# Patient Record
Sex: Male | Born: 1935 | Race: White | Hispanic: No | Smoking: Former smoker
Health system: Southern US, Community
[De-identification: ages and names within clinical notes are randomized; demographics above are authoritative.]

## PROBLEM LIST (undated history)

## (undated) DIAGNOSIS — I10 Essential (primary) hypertension: Secondary | ICD-10-CM

## (undated) DIAGNOSIS — H9190 Unspecified hearing loss, unspecified ear: Secondary | ICD-10-CM

## (undated) DIAGNOSIS — C801 Malignant (primary) neoplasm, unspecified: Secondary | ICD-10-CM

## (undated) DIAGNOSIS — M51369 Other intervertebral disc degeneration, lumbar region without mention of lumbar back pain or lower extremity pain: Secondary | ICD-10-CM

## (undated) DIAGNOSIS — M5136 Other intervertebral disc degeneration, lumbar region: Secondary | ICD-10-CM

## (undated) DIAGNOSIS — N529 Male erectile dysfunction, unspecified: Secondary | ICD-10-CM

## (undated) DIAGNOSIS — I219 Acute myocardial infarction, unspecified: Secondary | ICD-10-CM

## (undated) DIAGNOSIS — I255 Ischemic cardiomyopathy: Secondary | ICD-10-CM

## (undated) DIAGNOSIS — I4891 Unspecified atrial fibrillation: Secondary | ICD-10-CM

## (undated) DIAGNOSIS — R7301 Impaired fasting glucose: Secondary | ICD-10-CM

## (undated) DIAGNOSIS — K649 Unspecified hemorrhoids: Secondary | ICD-10-CM

## (undated) DIAGNOSIS — I2510 Atherosclerotic heart disease of native coronary artery without angina pectoris: Secondary | ICD-10-CM

## (undated) DIAGNOSIS — I5022 Chronic systolic (congestive) heart failure: Secondary | ICD-10-CM

## (undated) DIAGNOSIS — M109 Gout, unspecified: Secondary | ICD-10-CM

## (undated) DIAGNOSIS — D649 Anemia, unspecified: Secondary | ICD-10-CM

## (undated) DIAGNOSIS — E782 Mixed hyperlipidemia: Secondary | ICD-10-CM

## (undated) DIAGNOSIS — G4700 Insomnia, unspecified: Secondary | ICD-10-CM

## (undated) DIAGNOSIS — F0390 Unspecified dementia without behavioral disturbance: Secondary | ICD-10-CM

## (undated) DIAGNOSIS — G5600 Carpal tunnel syndrome, unspecified upper limb: Secondary | ICD-10-CM

## (undated) HISTORY — PX: APPENDECTOMY: SHX54

## (undated) HISTORY — PX: NASAL SINUS SURGERY: SHX719

## (undated) HISTORY — DX: Unspecified hemorrhoids: K64.9

## (undated) HISTORY — DX: Atherosclerotic heart disease of native coronary artery without angina pectoris: I25.10

## (undated) HISTORY — PX: COLONOSCOPY: SHX174

## (undated) HISTORY — PX: PROSTATECTOMY: SHX69

## (undated) HISTORY — PX: CHOLECYSTECTOMY: SHX55

## (undated) HISTORY — DX: Gout, unspecified: M10.9

## (undated) HISTORY — DX: Anemia, unspecified: D64.9

## (undated) HISTORY — DX: Essential (primary) hypertension: I10

## (undated) HISTORY — PX: HEMORRHOID BANDING: SHX5850

## (undated) HISTORY — PX: TONSILLECTOMY: SUR1361

## (undated) HISTORY — PX: HAND SURGERY: SHX662

## (undated) HISTORY — DX: Male erectile dysfunction, unspecified: N52.9

## (undated) HISTORY — DX: Chronic systolic (congestive) heart failure: I50.22

## (undated) HISTORY — DX: Insomnia, unspecified: G47.00

## (undated) HISTORY — DX: Carpal tunnel syndrome, unspecified upper limb: G56.00

## (undated) HISTORY — PX: INGUINAL HERNIA REPAIR: SUR1180

## (undated) HISTORY — DX: Impaired fasting glucose: R73.01

## (undated) HISTORY — DX: Mixed hyperlipidemia: E78.2

## (undated) HISTORY — DX: Unspecified hearing loss, unspecified ear: H91.90

---

## 1983-07-26 DIAGNOSIS — I219 Acute myocardial infarction, unspecified: Secondary | ICD-10-CM

## 1983-07-26 HISTORY — DX: Acute myocardial infarction, unspecified: I21.9

## 2001-07-25 HISTORY — PX: COLONOSCOPY: SHX174

## 2002-07-25 HISTORY — PX: CORONARY ARTERY BYPASS GRAFT: SHX141

## 2002-08-07 ENCOUNTER — Encounter: Payer: Self-pay | Admitting: *Deleted

## 2002-08-07 ENCOUNTER — Encounter (HOSPITAL_COMMUNITY): Admission: RE | Admit: 2002-08-07 | Discharge: 2002-09-06 | Payer: Self-pay | Admitting: *Deleted

## 2002-08-08 ENCOUNTER — Encounter: Payer: Self-pay | Admitting: *Deleted

## 2002-08-08 ENCOUNTER — Inpatient Hospital Stay (HOSPITAL_COMMUNITY): Admission: RE | Admit: 2002-08-08 | Discharge: 2002-08-21 | Payer: Self-pay | Admitting: *Deleted

## 2002-08-11 ENCOUNTER — Encounter: Payer: Self-pay | Admitting: Thoracic Surgery (Cardiothoracic Vascular Surgery)

## 2002-08-13 ENCOUNTER — Encounter: Payer: Self-pay | Admitting: Thoracic Surgery (Cardiothoracic Vascular Surgery)

## 2002-08-14 ENCOUNTER — Encounter: Payer: Self-pay | Admitting: Thoracic Surgery (Cardiothoracic Vascular Surgery)

## 2002-08-15 ENCOUNTER — Encounter: Payer: Self-pay | Admitting: Thoracic Surgery (Cardiothoracic Vascular Surgery)

## 2002-09-12 ENCOUNTER — Ambulatory Visit (HOSPITAL_COMMUNITY): Admission: RE | Admit: 2002-09-12 | Discharge: 2002-09-12 | Payer: Self-pay | Admitting: *Deleted

## 2004-08-17 ENCOUNTER — Ambulatory Visit (HOSPITAL_COMMUNITY): Admission: RE | Admit: 2004-08-17 | Discharge: 2004-08-17 | Payer: Self-pay | Admitting: Ophthalmology

## 2007-11-06 ENCOUNTER — Ambulatory Visit (HOSPITAL_COMMUNITY): Admission: RE | Admit: 2007-11-06 | Discharge: 2007-11-06 | Payer: Self-pay | Admitting: Family Medicine

## 2008-07-25 HISTORY — PX: COLONOSCOPY: SHX174

## 2010-12-10 NOTE — Op Note (Signed)
NAME:  Evan Melendez, Evan Melendez                       ACCOUNT NO.:  [id number]   MEDICAL RECORD NO.:  [id number]                   PATIENT TYPE:  INP   LOCATION:  2303                                 FACILITY:  [facility]   PHYSICIAN:  Salvatore Decent. Cornelius Moras, M.D.              DATE OF BIRTH:  11-04-1925   DATE OF PROCEDURE:  08/13/2002  DATE OF DISCHARGE:                                 OPERATIVE REPORT   PREOPERATIVE DIAGNOSIS:  Severe three-vessel coronary artery disease with  atypical chest pain and abnormal  stress Cardiolite examination.   POSTOPERATIVE DIAGNOSIS:  Severe three-vessel coronary artery disease with  atypical chest pain and abnormal stress Cardiolite examination.   PROCEDURE:  Median sternotomy for coronary artery bypass grafting x3 (left  internal mammary artery to the distal left anterior descending coronary  artery, saphenous vein graft to the circumflex marginal branch, saphenous  vein graft to the distal right coronary artery).   SURGEON:  Salvatore Decent. Cornelius Moras, M.D.   FIRST ASSISTANT:  Kerin Perna, M.D.   SECOND ASSISTANT:  Lissa Merlin, P.A.   ANESTHESIA:  General.   INDICATIONS FOR PROCEDURE:  The patient is a 77 year old gentleman followed  by Dr. Lubertha South and Dr. Cecil Cranker, who has a history of coronary  artery disease, hypertension, and a remote history of tobacco abuse.  The  patient presents with progressive weakness and atypical chest pain.  He had  a stress Cardiolite exam which was abnormal, prompting a cardiac  catheterization.  This was performed by Dr. Jonelle Sidle on August 08, 2002, and notable for the findings of severe three-vessel coronary  artery disease with long segment high-grade calcified proximal stenosis of  the left anterior descending coronary artery.  This not felt amenable to  percutaneous coronary intervention.  A full consultation note has been  dictated previously.  The patient and his wife have been counseled at  length  regarding the indications and potential benefits of coronary artery bypass  grafting.  Alternative treatment strategies have been discussed.  They  understand and accept all associated risks of surgery, including but not  limited to, risks of death, stroke, myocardial infarction, bleeding  requiring blood transfusion, respiratory failure, arrhythmia, infection, and  recurrent coronary artery disease.  They desire to proceed with surgery as  described.   DESCRIPTION OF PROCEDURE:  The patient is brought to the operating room on  the above-mentioned date and central monitoring is established by the  anesthesia service under the care and direction of Dr. Sheldon Silvan.  Specifically, a Swan-Ganz catheter is placed through the right internal  jugular approach.  A radial arterial line is placed.  Intravenous  antibiotics are administered.  Following the induction with general  endotracheal anesthesia, a Foley catheter is placed.  A transesophageal  echocardiogram is performed by Dr. Ivin Booty.  This demonstrates the presence of  mild to moderate left ventricular dysfunction with inferior  wall akinesis  and normal function of the remainder of the left ventricle.  There is mild  aortic insufficiency.  The patient's chest, abdomen, both groins and both lower extremities are  prepared and draped in a sterile manner.  A median sternotomy incision is  performed in the left internal mammary artery.  It was dissected from the  chest wall and prepared for bypass grafting.  The left internal mammary  artery is a good quality conduit.  Simultaneously the saphenous vein is  obtained from the patient's right thigh using endoscopic vein harvest  technique.  The saphenous vein here is somewhat small, sclerotic, and only a  portion of it is suitable for grafting.  Subsequently an additional segment  of saphenous vein is obtained from the patient's left lower leg through a  longitudinal incision.  This  saphenous vein is also somewhat small, but  suitable as conduit for grafting.  The patient is heparinized systemically.  The pericardium is opened.  There are diffuse adhesions throughout the  pericardial space consistent with old pericarditis.  These are divided using  sharp dissection.  The ascending aorta is inspected.  The ascending aorta is  severely diseased with atherosclerotic plaque.  There are diffuse areas of  plaque, particularly located proximally.  The arch feels soft and more  normal in appearance in the vicinity of the takeoff of the innominate  artery.  The aorta is cannulated for cardiopulmonary bypass in this location  without difficulty.  A two-stage venous cannula is placed through the right  atrium.  Adequate heparinization is verified.  Cardiopulmonary bypass is begun, and the surface of the heart is inspected.  There is diffuse coronary artery disease with hard calcified atherosclerotic  plaque located throughout all of the epicardial vessels.  This seems to be  somewhat out of proportion to the patient's radiographic appearance at  cardiac catheterization, including the large circumflex marginal branch,  which is diffusely diseased.  There is scar in the inferior wall.  Portions  of saphenous vein and in the left internal mammary artery are trimmed to  appropriate lengths.  A temperature probe is placed in the left ventricular  septum.  A cardioplegic catheter is placed in the ascending aorta.  The patient is cooled to 30 degrees systemic temperature.  The aortic  crossclamp is applied, and cardioplegia is delivered initially in an  antegrade fashion through the aortic root.  Iced saline slush is applied for  topical hypothermia.  The initial cardioplegic arrest and myocardial cooling  are notable sluggish, and felt to be somewhat unsatisfactory.  Despite an  initial dose of 1500 cubic centimeters of cardioplegia, satisfactory septal cooling is not achieved.   Subsequently, a retrograde cardioplegic catheter  is placed through the right atrium into the coronary sinus.  Additional  cardioplegia is administered and excellent myocardial cooling is achieved.  Repeat doses of cardioplegia are administered intermittently throughout the  crossclamp portion of the operation, both through the retrograde  cardioplegia catheter, as well as antegrade through subsequently-placed vein  grafts, to maintain septal temperature below 15 degrees Centigrade.  The  following distal coronary anastomoses are performed:  The distal right  coronary artery is grafted with a saphenous vein graft in an end-to-side  fashion.  This coronary measured 1.5 mm in diameter and is of fair to poor  quality.  The circumflex marginal branch is grafted with a saphenous vein  graft in an end-to-side fashion.  This coronary measures 1.8 mm in diameter  and is of fair quality.  The distal left anterior descending coronary artery  is grafted with the left internal mammary artery in an end-to-side fashion.  This coronary measured 1.8 mm in diameter and is of good quality.  Both  proximal saphenous vein anastomoses are performed directly to the ascending  aorta prior to release of the aortic crossclamp.  Of note, the aorta is  diffusely diseased and there is visible and friable plaque within the aorta  at the site of aortotomy punches.  This is debrided as much as possible and  irrigated with copious iced saline solution.  The proximal anastomoses are  then constructed.  The patient is placed in a steep Trendelenburg position.  The septal temperature rises rapidly and dramatically upon reperfusion of  the left internal mammary artery.  One final dose of warm retrograde hot  shot cardioplegia is administered.  The aortic crossclamp is removed, after  a total crossclamp time of 81 minutes.  The heart is defibrillated into sinus rhythm and subsequently resumes  spontaneously.  Of note, the  patient did develop atrial fibrillation at the  beginning of the procedure prior to the initiation of cardiopulmonary  bypass.  The patient is rewarmed to greater than 37 degrees Centigrade  temperature.  The epicardial pacing wires are affixed to the right  ventricular outflow track and to the right atrial appendage.  All proximal  and distal anastomoses are inspected for hemostasis, and appropriate graft  orientation.  The patient is weaned from cardiopulmonary bypass without  difficulty.  The patient's rhythm on separation from bypass is normal sinus  rhythm.  No inotropic support is required.  The total cardiopulmonary bypass  time for the operation is 112 minutes.  A follow-up transesophageal  echocardiogram performed by Dr. Ivin Booty after the separation from bypass  demonstrates no significant changes in the left ventricular performance.  No  other abnormalities are specifically identified.  The venous and arterial cannulae are removed uneventfully.  Protamine is administered to reverse the anticoagulation.  The mediastinum and the left  chest are irrigated with saline solution containing vancomycin.  Meticulous  surgical hemostasis is ascertained.  The mediastinum and the left chest are  drained with three chest tubes placed through separate stab wound incisions  inferiorly.  The median sternotomy is closed in a routine fashion.  The  lower extremity incisions are closed in multiple layers in a routine  fashion.  All skin incisions are closed with subcuticular skin closures.  The patient tolerated the procedure well and was transported to the surgical  intensive care unit in stable condition.  There were no intraoperative  complications.  All sponge, instrument and needle counts are verified as  correct at the completion of the operation.  The patient was transfused one  10-pack of adult platelets due to thrombocytopenia after the reversal of  heparin with Protamine.  The particular  finding of this case with comment,  is the finding of the diffusely diseased ascending aorta, which would put  the patient at risk for atheroembolic complications.  The patient would not  be felt to be a candidate for a redo coronary artery bypass grafting because  of the severity of disease of his ascending aorta.  Salvatore Decent. Cornelius Moras, M.D.    CHO/MEDQ  D:  08/13/2002  T:  08/13/2002  Job:  [id number]   cc:   Cecil Cranker, M.D. [facility]   Dionicio Stall, M.D.   Lubertha South, M.D.   [facility]  [employer], [place]

## 2010-12-10 NOTE — Group Therapy Note (Signed)
   NAME:  Evan Melendez, Evan Melendez                         ACCOUNT NO.:  [id number]   MEDICAL RECORD NO.:  [id number]                  PATIENT TYPE:   LOCATION:                                       FACILITY:   PHYSICIAN:  Cecil Cranker, M.D. [facility]         DATE OF BIRTH:  12-31-1925   DATE OF PROCEDURE:  08/07/2002  DATE OF DISCHARGE:                                   PROGRESS NOTE   INDICATION:  The patient has history of coronary artery disease, status post  myocardial infarction 18 years ago complicated by congestive heart failure  and ventricular fibrillation.  His most recent stress test was approximately  11 years ago.  He is now presenting with atypical chest pain.   BASELINE DATA:  EKG:  Sinus rhythm with right bundle branch block at 40  beats per minute with left axis deviation, inverted T waves inferiorly.  Resting blood pressure 144/84.   The patient exercised for approximately 8 minutes and 5 seconds to 10.1 METS  to Bruce protocol stage III.  His maximum heart rate was 129 and maximum  blood pressure was 210/88.  His EKG showed ST depression and T-wave  inversion in V4-V6 with ST depression in the inferior leads as well.  The  patient remained asymptomatic throughout the stress test.  Exercise was  stopped secondary to significant ST depression.  The patient was given one  sublingual nitroglycerin in the recovery period for the ischemic changes on  EKG.  His ST depression and T-wave inversion resolved in the recovery period  after approximately 17 minutes.   Final images and results are pending M.D. review.  The patient was given a  prescription for sublingual nitroglycerin p.r.n. chest pain.     Amy Mercy Riding, P.A. [facility]                     E. Graceann Congress, M.D. [facility]    AB/MEDQ  D:  08/07/2002  T:  08/07/2002  Job:  [id number]

## 2010-12-10 NOTE — Consult Note (Signed)
NAME:  Evan Melendez, Evan Melendez                       ACCOUNT NO.:  [id number]   MEDICAL RECORD NO.:  [id number]                   PATIENT TYPE:  OIB   LOCATION:  2016                                 FACILITY:  [facility]   PHYSICIAN:  Salvatore Decent. Cornelius Moras, M.D.              DATE OF BIRTH:  16-Jan-1926   DATE OF CONSULTATION:  08/09/2002  DATE OF DISCHARGE:                                   CONSULTATION   PRIMARY CARE PHYSICIAN:  Donna Bernard, M.D.   REASON FOR CONSULTATION:  Severe two vessel coronary artery disease with  atypical chest pain and abnormal stress Cardiolite examination.   HISTORY OF PRESENT ILLNESS:  The patient is a 77 year old gentleman from  [place], [place], with history of coronary artery disease,  hypertension, and a remote history of tobacco use.  He apparently suffered a  large inferior wall myocardial infarction in 1985 which was complicated by  congestive heart failure and ventricular fibrillation.  Since then he has  been treated medically for coronary artery disease and hypertension.  He  quit smoking.  He has done quite well until recently.  Over the last several  months, he apparently developed increasing generalized weakness as well as  difficulty controlling his blood pressure.  He has had a few fleeting  episodes of substernal chest pain, although these have been quite atypical  in nature, very transient and not associated with physical activity,  shortness of breath, nausea, or diaphoresis.  He returned to see Dr. Corinda Gubler  last month and was subsequently scheduled for an elective stress Cardiolite  examination and 2-D echocardiogram.  The Cardiolite examination demonstrated  decreased LV function with ejection fraction estimated 44% in what appeared  to be a largely nonreversible profusion defect in the inferior septal and  posteroseptal walls with hypokinesia of the posterior and inferior septal  walls.  He was subsequently brought in for elective  cardiac catheterization  yesterday.  This was performed by Dr. Diona Browner and notable for the findings  of severe two vessel coronary artery disease with moderate left ventricular  dysfunction.  He has chronic occlusion of the right coronary artery with a  long segment, highly calcified  75% stenosis  of the proximal left anterior  descending coronary artery.  This lesion is felt not amenable to  percutaneous coronary intervention and cardiac surgical consultation has  been requested.   REVIEW OF SYSTEMS:  GENERAL:  The patient reports overall feeling relatively  well with exception of his slight progression of exertional fatigue.  He has  good appetite.  He has lost weight over the last year or so, although his  wife states that this has been related to dietary changes they have made and  increased physical activity.  CARDIAC:  The patient denies any symptoms of  typical angina.  He has had fleeting episodes of atypical chest pain which  he describes as a pressing pain located to  the left of midline which comes  and goes sporadically at any time and usually only lasts a few seconds at  most.  They are not associated with physical activity.  He denies any  exertional shortness of breath, PND, orthopnea, or lower extremity edema.  He has had occasional palpitations but he denies any syncopal episodes or  near syncopal spells.  RESPIRATORY:  Essentially negative.  The patient does  note that he has a dry hacking, nonproductive cough which started  approximately one month ago.  He denies any productive cough, hemoptysis, or  wheezing.  GASTROINTESTINAL:  Negative.  The patient reports occasional  difficulty with constipation but this has been managed with dietary changes.  He denies any recent history of hematochezia, hematemesis, or melena.  He  did undergo colonoscopy and polypectomy last August with findings of a  benign colonic polyp.  He reports no difficulty swallowing.  ENDOCRINE:   Negative and notable for the absence of history of diabetes.  MUSCULOSKELETAL:  Notable for some mild arthralgias in his back, hip, and in  particular his left knee.  He has history of Dupuytren's contractures in  both hands.  NEUROLOGIC:  Negative and notable for the absence of any  history of transient monocular blindness or transient numbness or weakness  involving either upper or lower extremity.  HEMATOLOGIC:  Notable only for  history of iron-deficiency anemia in the past.  The patient denies any  bleeding diaphysis, easy bruising, or frequent episodes of epistaxis.  HEENT:  Notable for cataract in the left eye.  The patient states his eye  sight is stable.  He has full set upper and partial lower dentures.  He  denies any loose teeth or painful teeth.  He is moderately hard of hearing  in both ears.   PAST MEDICAL HISTORY:  This is notable for history of coronary artery  disease, status post acute inferior myocardial infarction in 1985.  The  patient has history of hypertension.  The patient denies any known history  of diabetes, stroke, peripheral vascular disease.  The patient's lipid  status has remained under reasonably good control.   PAST SURGICAL HISTORY:  This is notable for previous cholecystectomy and  inguinal hernia repair.   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  They have two grown children and several grandchildren.  He remains quite  active physically and also plays a lot of bluegrass music.  He is currently  a nonsmoker, although he previously smoked and he quit at the time of his  heart attack in 1985.  He denies excessive alcohol consumption.   MEDICATIONS PRIOR TO ADMISSION:  1. Atenolol 100 mg p.o. once daily.  2. Lasix 40 mg p.o. once daily.  3. Zantac 150 mg p.o. once daily.  4. Aspirin 81 mg p.o. once daily.  5. Lipitor 20 mg p.o. once daily.  6. Altace 5 mg p.o. once daily. *The Lipitor and Altace were recently  added by Dr. Corinda Gubler.  1. The patient takes an iron supplement.   ALLERGIES:  The patient reports a drug sensitivity to QUINIDINE.   PHYSICAL EXAMINATION:  GENERAL APPEARANCE:  A well-appearing white male who  appears his stated age in no acute distress.  VITAL SIGNS:  He is currently in sinus rhythm with heart rate in the 40s or  50s.  Blood pressure has remained reasonably well controlled this  hospitalization, measuring 130/55 this morning.  He has been afebrile.  HEENT:  Essentially within normal limits.  NECK:  Supple.  There is no cervical or supraclavicular lymphadenopathy.  There is no jugular venous distension.  No carotid bruits are noted.  CHEST:  Auscultation of the chest reveals clear and symmetrical breath  sounds bilaterally.  No wheezes or rhonchi are noted.  CARDIOVASCULAR:  There is regular rate and rhythm.  No murmurs, rubs, or  gallops are noted.  ABDOMEN:  Soft and nontender.  There are no palpable masses.  The liver edge  is not enlarged.  Bowel sounds are present.  EXTREMITIES:  The extremities are warm and well perfused.  There is no lower  extremity edema.  Distal pulses are palpable in both lower legs at the  ankle.  There is no sign of significant veinous insufficiency.  RECTAL AND GU:  These examinations are both deferred.  NEUROLOGIC:  Grossly nonfocal examination.   DIAGNOSTIC TESTS:  Cardiac catheterization performed yesterday by Dr.  Diona Browner has been referred.  This demonstrated long segment, heavily  calcified 75% proximal stenosis of the left anterior descending coronary  artery.  I agree that this likely would not be very amenable to percutaneous  coronary intervention.  There is a 30 to 50% ostial stenosis of the left  circumflex coronary artery and 30% stenosis at the mid portion of this  vessel.  There is 100% chronic occlusion of the right coronary artery with  left-to-right collateral filling of the distal right circulation.  Left  ventricular  function is moderately reduced with akinesis of the inferior  wall.  There is trace to mild mitral regurgitation.  The remainder of the  left ventricle contracts vigorously and overall ejection fraction is  estimated at 42%.   IMPRESSION:  Severe two vessel coronary artery disease with atypical chest  pain and abnormal stress Cardiolite examination.  I believe that the patient  would probably best be treated with elective coronary artery bypass  grafting.   PLAN:  I have outlined issues at length with the patient and his wife.  Alternative treatment strategies have been discussed in detail. They  understand and accept all associated risks of surgery and desire to proceed  with bypass surgery as planned.  The patient specifically understands and  accepts all associated risks including, but not limited to, risks of death,  stroke, myocardial infarction, respiratory failure, pneumonia, bleeding requiring blood transfusion, arrhythmia, infection, recurrent coronary  artery disease.  We plan to proceed with surgery on Tuesday,  August 11, 2002, first case.                                                Salvatore Decent. Cornelius Moras, M.D.    CHO/MEDQ  D:  08/09/2002  T:  08/09/2002  Job:  [id number]   cc:   Cecil Cranker, M.D. [facility]   Donna Bernard, M.D.  [address]  [place]  [place] [address]  Fax: [phone]   Vida Roller, M.D.  Fax: [phone]   [facility]  [place], [place]

## 2010-12-10 NOTE — Discharge Summary (Signed)
NAME:  Evan Melendez, Evan Melendez                       ACCOUNT NO.:  [id number]   MEDICAL RECORD NO.:  [id number]                   PATIENT TYPE:  INP   LOCATION:  2033                                 FACILITY:  [facility]   PHYSICIAN:  Salvatore Decent. Cornelius Moras, M.D.              DATE OF BIRTH:  [birth year]   DATE OF ADMISSION:  08/08/2002  DATE OF DISCHARGE:  08/18/2002                                 DISCHARGE SUMMARY   ADMITTING DIAGNOSIS:  1. Coronary artery disease.  2. Normal Cardiolite stress test.   PAST MEDICAL HISTORY:  1. Coronary artery disease status post MI 1985, treated medically.  2. Hypertension.  3. Tobacco use.  He quit in 1985.   SURGICAL HISTORY:  1. Cholecystectomy.  2. Inguinal hernia repair.   DRUG SENSITIVITY:  THIS PATIENT REPORTS A DRUG SENSITIVITY TO QUINIDINE.   BRIEF HISTORY:  The patient is a 77 year old Caucasian gentleman who is  followed by the [facility] for his cardiac disease.  Several  months prior to admission, he developed increasing generalized weakness,  difficulty controlling his blood pressure, and fleeting chest pain.  He was  evaluated by Dr. Corinda Gubler in the office, who recommended elective stress  Cardiolite exam and 2D echocardiogram.  Cardiolite revealed decreased LV  function with ejection fraction estimated to be 44%.  With this result, Dr.  Corinda Gubler recommended proceeding with a cardiac catheterization.  This was  performed on January 15 by Dr. Diona Browner and was notable for severe 3-vessel  coronary artery disease with moderate left ventricular dysfunction.  His  lesions were not amenable to PCI, and therefore cardiac surgery consultation  was requested.  He was evaluated on January 75 by Dr. Tressie Stalker.  After  examination of the patient and review of available records, Dr. Cornelius Moras  recommended proceeding with coronary artery bypass grafting as the preferred  treatment route for this gentleman.  The procedure risks and benefits  were  discussed with the patient and his wife, and they agreed  to proceed.   Carotid Doppler studies revealed no significant coronary artery disease.  He  was noted to have palpable pedal pulses bilaterally.   The patient remained stable in the hospital over the next several days, and  on August 13, 2002, he underwent the following surgical procedure:  Coronary artery bypass grafting x3 with Dr. Tressie Stalker.  Grafts placed at  the time of procedure:  Left internal mammary artery grafted to the left  anterior descending artery, saphenous venous grafting to the right coronary  artery, saphenous vein grafting to the marginal artery.  Veins were  harvested from the right side via Endovein harvesting technique for the vein  bypass grafts.  The patient tolerated the procedure reasonably well and  transferred in stable condition to the SICU.  He remained hemodynamically  stable during the postoperative period.  He was extubated later that day.   The patient's postoperative course has  been uneventful.  His beta blockers  and ACE inhibitors have both restarted at low doses.  He does have some mild  volume overload, and he is being treated with diuretic.   The morning of August 17, 2002, the patient reports feeling well.  He is  awake, alert, and answering questions appropriately.  His vital signs are  stable.  He is oxygenating well with room air saturation at 95.7.  Blood  pressure is 101/44.  The patient is making good progress in recovering from  his surgery.  It is anticipated he will be discharged home in the next 24 to  48 hours.   LABORATORY STUDIES:  January 24:  CBC reveals white blood cells 4.9,  hemoglobin 8.1, hematocrit 23.3, platelets 101.  Chemistries include sodium  139, potassium 4.2, BUN 31, creatinine 1.5, glucose 119.   CONDITION ON DISCHARGE:  Improved.   INSTRUCTIONS ON DISCHARGE:  MEDICATIONS:  1. Toprol XL 25 mg p.o. q.d.  2. Altace 2.5 mg p.o. q.d.  This is a  new dose for him.  3. Lasix 40 mg p.o. q.d.  This is also a new dose for him.  4. Potassium chloride 20 mEq p.o. q.d.  5. Enteric-coated aspirin 325 mg p.o. q.d.  6. Folic acid 1 mg p.o. q.d.   He is also instructed to resume the following home medications:  1. Lipitor 20 mg p.o. q.h.s.  2. Ranitidine 150 mg p.o. q.d.   PAIN MANAGEMENT:  He is instructed to take Ultram 50 mg 1-2 p.o. q. 4-6  hours p.r.n. for moderate to severe pain, or Tylenol 325 mg 1-2 p.o. q. 4-6  hours p.r.n.Marland Kitchen   ACTIVITY:  No driving, no heavy lifting, pushing, or pulling.  He is  instructed to continue his previous exercise and daily walking.   DIET:  His diet should be a low-fat, low-salt diet.   WOUND CARE:  He may shower with mild soap and water.  If his incisions are  red, hot, swollen, draining, or if he has a fever greater than 100 degrees  Fahrenheit, he should call Dr. Orvan July office.   FOLLOW UP:  1. He will have an appointment to see a physician at [facility] in     [place] in approximately 2     weeks.  They will be in contact with him for an appointment.  2. He will have an appointment to see Dr. Cornelius Moras at the [facility] office in 3     weeks.  The office will call him with the date and time.     Toribio Harbour, R.N.                  Salvatore Decent. Cornelius Moras, M.D.    CTK/MEDQ  D:  08/17/2002  T:  08/18/2002  Job:  [id number]   cc:   Salvatore Decent. Cornelius Moras, M.D.  [address]  [place]  [place] [address]  Fax: [phone]   E. Graceann Congress, M.D. [facility]   Lacretia Nicks Simone Curia, M.D.  [address]  [place]  [place] [address]  Fax: [phone]

## 2010-12-10 NOTE — Cardiovascular Report (Signed)
NAME:  Evan Melendez, Evan Melendez NO.:  [id number]   MEDICAL RECORD NO.:  [id number]                   PATIENT TYPE:  OIB   LOCATION:  2899                                 FACILITY:  [facility]   PHYSICIAN:  Jonelle Sidle, M.D. [facility]        DATE OF BIRTH:  [birth year]   DATE OF PROCEDURE:  08/08/2002  DATE OF DISCHARGE:                              CARDIAC CATHETERIZATION   PRIMARY CARE PHYSICIAN:  Wende Crease, M.D.   Corinda Gubler CARDIOLOGIST:  Cecil Cranker, M.D.   INDICATIONS:  The patient is a 77 year old male with a history of  hypertension and remote inferior myocardial wall infarction in 1985.  Cardiac catheterization at [facility] at that time  apparently revealed an occluded right coronary artery and the patient has  been managed medically since then.  He has had recent episodes of  significant hypertension as well as intermittent chest discomfort and  apparent drops in blood pressure during exercise at cardiac rehabilitation.  He was referred for a Cardiolite which was reported as showing a  nonreversible defect in the inferior septal and posterior septal wall with  an ejection fraction of 44% and hypokinesis of the posterior and inferior  septal walls.  He is now referred for coronary angiography to assess his  coronary anatomy.   PROCEDURES PERFORMED:  1. Left heart catheterization.  2. Selective coronary angiography.  3. Left ventriculography.  4. Distal aortography.   ACCESS AND EQUIPMENT:  The area about the right femoral artery was  anesthetized with 1% lidocaine and a 6 French sheath was placed in the right  femoral artery via the modified Seldinger technique. Standard preformed 6  [place] and JR4 catheters were used for selective coronary angiography,  and an angled pigtail catheter was used for left heart catheterization and  left ventriculography.  All exchanges were made over a wire and the patient  tolerated  the procedure well without obvious complications.  The angled  pigtail catheter was also used for distal aortography.   HEMODYNAMICS:  1. Left ventricle 166/12 mmHg.  2. Aorta 159/57 mmHg.   ANGIOGRAPHIC FINDINGS:  1. Left main coronary artery has a 30% ostial stenosis.  2. Left anterior descending is calcified proximally with a 50% ostial     stenosis and an 75% diffuse proximal stenosis.  3. The circumflex coronary artery has a 50% ostial stenosis essentially     involving the bifurcation of the left anterior descending and circumflex     from the left main. There is a 30% proximal stenosis noted as well,     otherwise minor luminal irregularities are observed.  4. The right coronary artery is occluded proximally and has well-developed     collaterals predominately from the circumflex, but also a left anterior     descending.   LEFT VENTRICULOGRAPHY:  Left ventriculography was performed in the RAO  projection revealed an ejection fraction of 42% with inferior basal akinesis  and 1+ mitral regurgitation.   DISTAL AORTOGRAM:  Distal aortography revealed a 50% left renal artery  stenosis, 40% distal aortic stenosis with other atherosclerotic changes and  a 50% left iliac stenosis.   DIAGNOSES:  1. Two-vessel obstructive coronary artery disease with an occluded right     coronary artery and a proximally calcified left anterior descending with     50 and 75% stenoses.  The right coronary artery fills by well-developed     collaterals from the circumflex and left anterior descending.  2. Decreased ejection fraction of 42% with inferior basal akinesis and 1+     mitral regurgitation.  3. Peripheral vascular disease as outlined with a 50% left renal artery     stenosis and a 50% left iliac stenosis.   RECOMMENDATIONS:  I reviewed the films with Dr. Chales Abrahams.  It is interesting  that the patient's recent Cardiolite revealed no anterior wall ischemia and  essentially inferior scar.   However, the patient has had some fluctuations  in blood pressure and intermittent chest discomfort.  Given the complexity  of the left anterior descending stenosis, it is not a good percutaneous  intervention candidate.  The left system is particularly important given the  fact that it completely collateralizes the right.  Options would include  continued medical therapy versus complete revascularization with bypass  grafting. We will ask [facility] to review the films and at least discuss his  option with the patient and his family. We will otherwise try to maximize  medications.                                                  Jonelle Sidle, M.D. [facility]    SGM/MEDQ  D:  08/08/2002  T:  08/09/2002  Job:  [id number]   cc:   Wende Crease, M.D.   Cecil Cranker, M.D. [facility]

## 2010-12-10 NOTE — Consult Note (Signed)
   NAME:  Evan Melendez, Evan Melendez                         ACCOUNT NO.:  [id number]   MEDICAL RECORD NO.:  [id number]                   PATIENT TYPE:   LOCATION:                                       FACILITY:   PHYSICIAN:  Cecil Cranker, M.D. [facility]         DATE OF BIRTH:  [birth year]   DATE OF CONSULTATION:  DATE OF DISCHARGE:                                   CONSULTATION   Please see the enclosed dictation of 07/17/02.  The patient  following that  was set up for a stress Cardiolite.  He has history of coronary artery  disease with acute inferior wall myocardial infarction in 1985 complicated  by congestive heart failure and ventricular fibrillation.  He had a  catheterization at that time at [facility], but we do not have this  report.  He has had some atypical symptoms recently with some hypertension,  weakness and this is similar to what he had prior to his previous heart  attack.  The stress test this morning revealed the patient walked through  three stages of Bruce protocol with no symptoms.  However, with downsloping  ST  depression with T-wave inversion and this maximized at 4 mm.  This was  in the setting of right bundle branch block.  The imaging revealed inferior  septal hypokinesis.  This was not reversible.  It was felt that the patient  should have coronary angiography because of long history of coronary disease  and with the markedly positive stress EKG.  He was examined again today and  is no change from 12/24.  His BMP reveals sodium 144, potassium 3.6,  chloride 107, CO2 43, BUN 18, creatinine 1.3.  We planned to get CBC, PTT  and INR today.  We will give him 40 of Kay Ciel.  He should have the BMP  repeated prior to his catheterization on 08/08/02.  The patient  is agreeable  to this approach.  I have discussed the risks with him.  Because he has a  palpable aorta with a bruit, I suggested abdominal  angiography.  He had an  ultrasound of the abdomen in  September.   However, was unable to get this  report.   ALLERGIES:  QUINIDINE.                                                Cecil Cranker, M.D. [facility]    EJL/MEDQ  D:  08/07/2002  T:  08/07/2002  Job:  [id number]   cc:   Donna Bernard, M.D.  [address]  [place]  [place] [address]  Fax: [phone]

## 2010-12-10 NOTE — Op Note (Signed)
   NAME:  Evan Melendez, Evan Melendez NO.:  [id number]   MEDICAL RECORD NO.:  [id number]                   PATIENT TYPE:   LOCATION:                                       FACILITY:   PHYSICIAN:  Vida Roller, M.D.                DATE OF BIRTH:   DATE OF PROCEDURE:  08/07/2002  DATE OF DISCHARGE:                                  ECHOCARDIOGRAM   PROCEDURE NO:  [id number]   PROCEDURE:  Echocardiogram.   INDICATIONS FOR PROCEDURE:  This is a 77 year old male with known coronary  artery disease and chest pain, status post a myocardial infarction in 1985.   MODE MEASUREMENTS:  AORTA:  29 mm (which is normal).  LEFT ATRIUM:  39 mm (which is normal).  SEPTUM:  17 mm (which is enlarged).  POSTERIOR WALL:  13 mm (which is enlarged).  LEFT VENTRICULAR DIASTOLIC DIMENSION:  44 mm (which is normal).  LEFT VENTRICULAR SYSTOLIC DIMENSION:  39 mm (which is top limits of normal).   RESULTS:  The left ventricle is in the upper limits of normal, with evidence  of severely depressed left ventricular ejection fraction estimated at 35-  40%; with inferior, inferoseptal, posterior and apical hypokinesis,  consistent with a myocardial infarction.  There is also evidence of some  anterior hypokinesis as well.  The left atrium is top limits of normal size.  The aorta appears to have significant atheroma in its ascending portion.  The aortic valve is tri-leaflet and is sclerotic.  There is mild aortic  regurgitation.  The mitral valve appears to be morphologically unremarkable  and has mild mitral regurgitation.  The pulmonic valve also appears to have  moderate regurgitation and there is trivial tricuspid regurgitation.  The  tricuspid jet velocity is insufficiency to assess right ventricular systolic  pressure.   RECOMMENDATIONS:  This gentleman to undergo further evaluation for his  ischemic cardiomyopathy, with consideration for a coronary angiography if  clinically  indicated.                                               Vida Roller, M.D.    JH/MEDQ  D:  08/07/2002  T:  08/08/2002  Job:  [id number]

## 2011-12-13 ENCOUNTER — Encounter: Payer: Medicare Other | Admitting: Hematology and Oncology

## 2011-12-13 DIAGNOSIS — D649 Anemia, unspecified: Secondary | ICD-10-CM

## 2011-12-13 DIAGNOSIS — D696 Thrombocytopenia, unspecified: Secondary | ICD-10-CM

## 2011-12-13 DIAGNOSIS — M109 Gout, unspecified: Secondary | ICD-10-CM

## 2012-02-14 ENCOUNTER — Encounter: Payer: Medicare Other | Admitting: Hematology and Oncology

## 2012-10-16 ENCOUNTER — Ambulatory Visit (INDEPENDENT_AMBULATORY_CARE_PROVIDER_SITE_OTHER): Payer: Medicare Other | Admitting: Family Medicine

## 2012-10-16 ENCOUNTER — Encounter: Payer: Self-pay | Admitting: Family Medicine

## 2012-10-16 VITALS — BP 120/70 | HR 80 | Ht 70.0 in | Wt 148.0 lb

## 2012-10-16 DIAGNOSIS — I10 Essential (primary) hypertension: Secondary | ICD-10-CM

## 2012-10-16 DIAGNOSIS — I25810 Atherosclerosis of coronary artery bypass graft(s) without angina pectoris: Secondary | ICD-10-CM

## 2012-10-16 DIAGNOSIS — K649 Unspecified hemorrhoids: Secondary | ICD-10-CM

## 2012-10-16 DIAGNOSIS — D649 Anemia, unspecified: Secondary | ICD-10-CM

## 2012-10-16 DIAGNOSIS — M109 Gout, unspecified: Secondary | ICD-10-CM

## 2012-10-16 DIAGNOSIS — E785 Hyperlipidemia, unspecified: Secondary | ICD-10-CM

## 2012-10-16 MED ORDER — HYDROCORTISONE 2.5 % RE CREA: TOPICAL_CREAM | RECTAL | Status: DC

## 2012-10-16 NOTE — Patient Instructions (Signed)
Report any persistent black stools or any persisten bleeding

## 2012-10-16 NOTE — Progress Notes (Signed)
  Subjective:    Patient ID: Regan Lemming, male    DOB: [birth year], 79 y.o.   MRN: [id number]  HPI Some constip, but minimal. Irritation with bms. No blood. Pain sharp, irritated. Stools dark. Hemorrhoids have popped out. Prep h helped a little. Some irritation.   Review of Systems  ROS otherwise negative.    Objective:   Physical Exam Alert no acute distress. HEENT normal. Lungs clear. Heart regular rate rhythm. Rectal exam inflamed hemorrhoids. Rectal exam otherwise normal.       Assessment & Plan:  Impression rectal irritation secondary to flare of hemorrhoids. Plan as per orders.

## 2012-10-17 DIAGNOSIS — M109 Gout, unspecified: Secondary | ICD-10-CM | POA: Insufficient documentation

## 2012-10-17 DIAGNOSIS — E782 Mixed hyperlipidemia: Secondary | ICD-10-CM | POA: Insufficient documentation

## 2012-10-17 DIAGNOSIS — I10 Essential (primary) hypertension: Secondary | ICD-10-CM | POA: Insufficient documentation

## 2012-10-17 DIAGNOSIS — Z951 Presence of aortocoronary bypass graft: Secondary | ICD-10-CM | POA: Insufficient documentation

## 2012-10-17 DIAGNOSIS — D649 Anemia, unspecified: Secondary | ICD-10-CM | POA: Insufficient documentation

## 2012-10-30 ENCOUNTER — Encounter: Payer: Self-pay | Admitting: *Deleted

## 2012-11-01 ENCOUNTER — Ambulatory Visit (INDEPENDENT_AMBULATORY_CARE_PROVIDER_SITE_OTHER): Payer: Medicare Other | Admitting: Family Medicine

## 2012-11-01 ENCOUNTER — Encounter: Payer: Self-pay | Admitting: Family Medicine

## 2012-11-01 VITALS — BP 162/84 | HR 90 | Wt 152.0 lb

## 2012-11-01 DIAGNOSIS — M549 Dorsalgia, unspecified: Secondary | ICD-10-CM

## 2012-11-01 MED ORDER — DICLOFENAC SODIUM 75 MG PO TBEC: 75.0000 mg | DELAYED_RELEASE_TABLET | Freq: Two times a day (BID) | ORAL | Status: DC

## 2012-11-01 NOTE — Patient Instructions (Signed)
Consider local heating pad

## 2012-11-01 NOTE — Progress Notes (Signed)
  Subjective:    Patient ID: Regan Lemming, male    DOB: 19-Dec-1925, 79 y.o.   MRN: [id number]  Back Pain This is a new problem. The current episode started in the past 7 days. The problem occurs constantly. The problem has been gradually worsening since onset. The pain is at a severity of 4/10. The pain is moderate. The pain is worse during the day. The symptoms are aggravated by bending. Associated symptoms include abdominal pain. Risk factors include lack of exercise. He has tried nothing for the symptoms.    Pain is sharp. Worse with certain motion. Notes hemorrhoid symptoms have improved remarkably.  Review of Systems  Gastrointestinal: Positive for abdominal pain.  Musculoskeletal: Positive for back pain.   Otherwise negative.    Objective:   Physical Exam  Alert no acute distress. Lungs clear. Heart regular in rhythm. No spinal tenderness. Abdomen benign. Left paraspinal tenderness to deep palpation.      Assessment & Plan:  Impression musculoskeletal pain discussed. Plan local measures discussed. Voltaren twice a day with food when necessary. WSL followup regular appointment

## 2012-11-08 ENCOUNTER — Ambulatory Visit: Payer: Medicare Other | Admitting: Family Medicine

## 2012-11-09 ENCOUNTER — Telehealth: Payer: Self-pay | Admitting: Family Medicine

## 2012-11-09 NOTE — Telephone Encounter (Signed)
rx called into [facility] and pt notified on voicemail

## 2012-11-09 NOTE — Telephone Encounter (Signed)
Patient needs a stronger pain med called in because he is now having side pain.  [facility]

## 2012-11-09 NOTE — Telephone Encounter (Signed)
Hydrocod/acet 5/325 #30 one q 4-6 prn pain drowsy prec

## 2012-11-11 ENCOUNTER — Emergency Department (HOSPITAL_COMMUNITY): Payer: Medicare Other

## 2012-11-11 ENCOUNTER — Inpatient Hospital Stay (HOSPITAL_COMMUNITY)
Admission: EM | Admit: 2012-11-11 | Discharge: 2012-11-14 | DRG: 292 | Disposition: A | Discharge status: Home Health | Payer: Medicare Other | Attending: Internal Medicine | Admitting: Internal Medicine

## 2012-11-11 ENCOUNTER — Encounter (HOSPITAL_COMMUNITY): Payer: Self-pay | Admitting: *Deleted

## 2012-11-11 DIAGNOSIS — D649 Anemia, unspecified: Secondary | ICD-10-CM

## 2012-11-11 DIAGNOSIS — M5137 Other intervertebral disc degeneration, lumbosacral region: Secondary | ICD-10-CM | POA: Diagnosis present

## 2012-11-11 DIAGNOSIS — M109 Gout, unspecified: Secondary | ICD-10-CM | POA: Diagnosis present

## 2012-11-11 DIAGNOSIS — R410 Disorientation, unspecified: Secondary | ICD-10-CM

## 2012-11-11 DIAGNOSIS — I2510 Atherosclerotic heart disease of native coronary artery without angina pectoris: Secondary | ICD-10-CM

## 2012-11-11 DIAGNOSIS — Z87891 Personal history of nicotine dependence: Secondary | ICD-10-CM

## 2012-11-11 DIAGNOSIS — I252 Old myocardial infarction: Secondary | ICD-10-CM

## 2012-11-11 DIAGNOSIS — I10 Essential (primary) hypertension: Secondary | ICD-10-CM

## 2012-11-11 DIAGNOSIS — Z79899 Other long term (current) drug therapy: Secondary | ICD-10-CM

## 2012-11-11 DIAGNOSIS — I129 Hypertensive chronic kidney disease with stage 1 through stage 4 chronic kidney disease, or unspecified chronic kidney disease: Secondary | ICD-10-CM | POA: Diagnosis present

## 2012-11-11 DIAGNOSIS — I5022 Chronic systolic (congestive) heart failure: Secondary | ICD-10-CM | POA: Diagnosis present

## 2012-11-11 DIAGNOSIS — E7800 Pure hypercholesterolemia, unspecified: Secondary | ICD-10-CM | POA: Diagnosis present

## 2012-11-11 DIAGNOSIS — R404 Transient alteration of awareness: Secondary | ICD-10-CM | POA: Diagnosis present

## 2012-11-11 DIAGNOSIS — K649 Unspecified hemorrhoids: Secondary | ICD-10-CM

## 2012-11-11 DIAGNOSIS — N189 Chronic kidney disease, unspecified: Secondary | ICD-10-CM | POA: Diagnosis present

## 2012-11-11 DIAGNOSIS — I2589 Other forms of chronic ischemic heart disease: Secondary | ICD-10-CM | POA: Diagnosis present

## 2012-11-11 DIAGNOSIS — Z9079 Acquired absence of other genital organ(s): Secondary | ICD-10-CM

## 2012-11-11 DIAGNOSIS — Z66 Do not resuscitate: Secondary | ICD-10-CM | POA: Diagnosis present

## 2012-11-11 DIAGNOSIS — M51379 Other intervertebral disc degeneration, lumbosacral region without mention of lumbar back pain or lower extremity pain: Secondary | ICD-10-CM | POA: Diagnosis present

## 2012-11-11 DIAGNOSIS — Z951 Presence of aortocoronary bypass graft: Secondary | ICD-10-CM | POA: Diagnosis present

## 2012-11-11 DIAGNOSIS — I5023 Acute on chronic systolic (congestive) heart failure: Principal | ICD-10-CM | POA: Diagnosis present

## 2012-11-11 DIAGNOSIS — N179 Acute kidney failure, unspecified: Secondary | ICD-10-CM | POA: Diagnosis present

## 2012-11-11 DIAGNOSIS — R9431 Abnormal electrocardiogram [ECG] [EKG]: Secondary | ICD-10-CM

## 2012-11-11 DIAGNOSIS — E785 Hyperlipidemia, unspecified: Secondary | ICD-10-CM

## 2012-11-11 DIAGNOSIS — D509 Iron deficiency anemia, unspecified: Secondary | ICD-10-CM | POA: Diagnosis present

## 2012-11-11 DIAGNOSIS — I509 Heart failure, unspecified: Secondary | ICD-10-CM | POA: Diagnosis present

## 2012-11-11 DIAGNOSIS — E876 Hypokalemia: Secondary | ICD-10-CM | POA: Diagnosis not present

## 2012-11-11 DIAGNOSIS — N183 Chronic kidney disease, stage 3 unspecified: Secondary | ICD-10-CM | POA: Diagnosis present

## 2012-11-11 DIAGNOSIS — Z7982 Long term (current) use of aspirin: Secondary | ICD-10-CM

## 2012-11-11 DIAGNOSIS — I25810 Atherosclerosis of coronary artery bypass graft(s) without angina pectoris: Secondary | ICD-10-CM

## 2012-11-11 HISTORY — DX: Ischemic cardiomyopathy: I25.5

## 2012-11-11 HISTORY — DX: Other intervertebral disc degeneration, lumbar region without mention of lumbar back pain or lower extremity pain: M51.369

## 2012-11-11 HISTORY — DX: Acute myocardial infarction, unspecified: I21.9

## 2012-11-11 HISTORY — DX: Other intervertebral disc degeneration, lumbar region: M51.36

## 2012-11-11 LAB — CBC WITH DIFFERENTIAL/PLATELET
Basophils Relative: 0 % (ref 0–1)
Eosinophils Absolute: 0 10*3/uL (ref 0.0–0.7)
Lymphs Abs: 0.4 10*3/uL — ABNORMAL LOW (ref 0.7–4.0)
MCH: 28.8 pg (ref 26.0–34.0)
Neutro Abs: 4.2 10*3/uL (ref 1.7–7.7)
Neutrophils Relative %: 86 % — ABNORMAL HIGH (ref 43–77)
Platelets: 113 10*3/uL — ABNORMAL LOW (ref 150–400)
RBC: 4.03 MIL/uL — ABNORMAL LOW (ref 4.22–5.81)

## 2012-11-11 LAB — BASIC METABOLIC PANEL
GFR calc Af Amer: 52 mL/min — ABNORMAL LOW (ref >90)
GFR calc non Af Amer: 45 mL/min — ABNORMAL LOW (ref >90)
Glucose, Bld: 142 mg/dL — ABNORMAL HIGH (ref 70–99)
Potassium: 3.9 mEq/L (ref 3.5–5.1)
Sodium: 139 mEq/L (ref 135–145)

## 2012-11-11 LAB — PRO B NATRIURETIC PEPTIDE: Pro B Natriuretic peptide (BNP): 13370 pg/mL — ABNORMAL HIGH (ref 0–450)

## 2012-11-11 MED ORDER — ALLOPURINOL 100 MG PO TABS
100.0000 mg | ORAL_TABLET | Freq: Every day | ORAL | Status: DC
  Administered 2012-11-11 – 2012-11-14 (×4): 100 mg via ORAL
  Filled 2012-11-11 (×4): qty 1

## 2012-11-11 MED ORDER — VITAMIN C 500 MG PO TABS
250.0000 mg | ORAL_TABLET | Freq: Every day | ORAL | Status: DC
  Administered 2012-11-11 – 2012-11-14 (×4): 250 mg via ORAL
  Filled 2012-11-11 (×5): qty 1

## 2012-11-11 MED ORDER — FAMOTIDINE 20 MG PO TABS
20.0000 mg | ORAL_TABLET | Freq: Every day | ORAL | Status: DC
  Administered 2012-11-12 – 2012-11-14 (×3): 20 mg via ORAL
  Filled 2012-11-11 (×3): qty 1

## 2012-11-11 MED ORDER — SIMVASTATIN 20 MG PO TABS
40.0000 mg | ORAL_TABLET | Freq: Every evening | ORAL | Status: DC
  Administered 2012-11-11 – 2012-11-13 (×3): 40 mg via ORAL
  Filled 2012-11-11 (×3): qty 2

## 2012-11-11 MED ORDER — METOPROLOL TARTRATE 25 MG PO TABS
ORAL_TABLET | ORAL | Status: AC
  Administered 2012-11-11: 25 mg via ORAL
  Filled 2012-11-11: qty 1

## 2012-11-11 MED ORDER — HEPARIN SODIUM (PORCINE) 5000 UNIT/ML IJ SOLN
5000.0000 [IU] | Freq: Three times a day (TID) | INTRAMUSCULAR | Status: DC
  Administered 2012-11-11 – 2012-11-12 (×3): 5000 [IU] via SUBCUTANEOUS
  Filled 2012-11-11 (×3): qty 1

## 2012-11-11 MED ORDER — FAMOTIDINE 20 MG PO TABS
ORAL_TABLET | ORAL | Status: AC
  Administered 2012-11-11: 20 mg via ORAL
  Filled 2012-11-11: qty 1

## 2012-11-11 MED ORDER — SODIUM CHLORIDE 0.9 % IJ SOLN
3.0000 mL | Freq: Two times a day (BID) | INTRAMUSCULAR | Status: DC
  Administered 2012-11-11 – 2012-11-14 (×4): 3 mL via INTRAVENOUS

## 2012-11-11 MED ORDER — ENALAPRIL MALEATE 10 MG PO TABS: 10.0000 mg | ORAL_TABLET | Freq: Every day | ORAL | Status: DC

## 2012-11-11 MED ORDER — ENALAPRIL MALEATE 5 MG PO TABS
10.0000 mg | ORAL_TABLET | Freq: Two times a day (BID) | ORAL | Status: DC
  Administered 2012-11-11 – 2012-11-13 (×4): 10 mg via ORAL
  Filled 2012-11-11 (×5): qty 2

## 2012-11-11 MED ORDER — ASPIRIN 81 MG PO CHEW
CHEWABLE_TABLET | ORAL | Status: AC
  Administered 2012-11-11: 81 mg
  Filled 2012-11-11: qty 1

## 2012-11-11 MED ORDER — ONDANSETRON HCL 4 MG/2ML IJ SOLN: 4.0000 mg | Freq: Four times a day (QID) | INTRAMUSCULAR | Status: DC | PRN

## 2012-11-11 MED ORDER — FUROSEMIDE 10 MG/ML IJ SOLN
40.0000 mg | Freq: Two times a day (BID) | INTRAMUSCULAR | Status: DC
  Administered 2012-11-11 – 2012-11-12 (×3): 40 mg via INTRAVENOUS
  Filled 2012-11-11 (×3): qty 4

## 2012-11-11 MED ORDER — HYDROCORTISONE 2.5 % RE CREA
1.0000 "application " | TOPICAL_CREAM | Freq: Two times a day (BID) | RECTAL | Status: DC | PRN
  Filled 2012-11-11: qty 28.35

## 2012-11-11 MED ORDER — ONDANSETRON HCL 4 MG PO TABS: 4.0000 mg | ORAL_TABLET | Freq: Four times a day (QID) | ORAL | Status: DC | PRN

## 2012-11-11 MED ORDER — ASPIRIN EC 81 MG PO TBEC
81.0000 mg | DELAYED_RELEASE_TABLET | Freq: Every day | ORAL | Status: DC
  Administered 2012-11-12 – 2012-11-14 (×3): 81 mg via ORAL
  Filled 2012-11-11 (×3): qty 1

## 2012-11-11 MED ORDER — VITAMIN C 100 MG PO TABS: 100.0000 mg | ORAL_TABLET | Freq: Every day | ORAL | Status: DC

## 2012-11-11 MED ORDER — METOPROLOL TARTRATE 25 MG PO TABS
25.0000 mg | ORAL_TABLET | Freq: Two times a day (BID) | ORAL | Status: DC
  Administered 2012-11-11 – 2012-11-13 (×4): 25 mg via ORAL
  Filled 2012-11-11 (×4): qty 1

## 2012-11-11 MED ORDER — FUROSEMIDE 10 MG/ML IJ SOLN
40.0000 mg | Freq: Once | INTRAMUSCULAR | Status: AC
  Administered 2012-11-11: 40 mg via INTRAVENOUS
  Filled 2012-11-11: qty 4

## 2012-11-11 MED ORDER — FERROUS SULFATE 325 (65 FE) MG PO TABS
325.0000 mg | ORAL_TABLET | Freq: Every day | ORAL | Status: DC
  Administered 2012-11-12 – 2012-11-14 (×3): 325 mg via ORAL
  Filled 2012-11-11 (×3): qty 1

## 2012-11-11 NOTE — H&P (Signed)
[facility] History and Physical  Evan Melendez ZOX:[id number] DOB: 05-01-1926 DOA: 11/11/2012  Referring physician: Dr. Clarene Duke, ER physician. PCP: Harlow Asa, MD    Chief Complaint: Dyspnea.  HPI: Evan Melendez is a 79 y.o. male who presents with symptom of dyspnea that has been present for the last 2 days. It started at 2 AM on Saturday morning when it caused him to wake up in the middle of the night (paroxysmal nocturnal dyspnea). He was on his way to the emergency room but then his symptoms improved so he went back home. Yesterday in the daytime he was also dyspneic on exertion minimally. He presents today because the dyspnea has not improved. There is no productive cough or fever. He denies any chest pain. He denies any orthopnea or recently. There is no ankle swelling. He has not gained any significant weight. He has a history of CABG 10 years ago for triple-vessel coronary artery disease and low ejection fraction of around 44%. He has been using Voltaren for the last week or so for back pain but he says not on a regular basis.   Review of Systems:   Apart from history of present illness, other systems negative.  Past Medical History  Diagnosis Date  . Hypertension   . Anemia   . Gout   . ED (erectile dysfunction)   . Hypercholesteremia   . CAD (coronary artery disease)   . Impaired fasting glucose   . Insomnia   . CTS (carpal tunnel syndrome)   . CHF (congestive heart failure)   . Myocardial infarction 1985  . Ischemic cardiomyopathy   . DDD (degenerative disc disease), lumbar    Past Surgical History  Procedure Laterality Date  . Appendectomy    . Prostatectomy    . Cholecystectomy    . Tonsillectomy    . Colonoscopy    . Coronary artery bypass graft  2004  . Inguinal hernia repair     Social History:  He is a widower and lives alone. He has a daughter who lives in [place] and a son who lives in [place]. He does not smoke cigarettes any longer. He  does not drink alcohol. He is retired. He is normally independent in terms of activities.   Allergies  Allergen Reactions  . Quinidine Other (See Comments)    Chest Pain    No family history on file. noncontributory.   Prior to Admission medications   Medication Sig Start Date End Date Taking? Authorizing Provider  allopurinol (ZYLOPRIM) 100 MG tablet Take 100 mg by mouth daily.   Yes Historical Provider, MD  Ascorbic Acid (VITAMIN C) 100 MG tablet Take 100 mg by mouth daily.   Yes Historical Provider, MD  aspirin 81 MG tablet Take 81 mg by mouth daily.   Yes Historical Provider, MD  diclofenac (VOLTAREN) 75 MG EC tablet Take 1 tablet (75 mg total) by mouth 2 (two) times daily. 11/01/12  Yes Merlyn Albert, MD  enalapril (VASOTEC) 10 MG tablet Take 10 mg by mouth daily.   Yes Historical Provider, MD  ferrous sulfate 325 (65 FE) MG tablet Take 325 mg by mouth daily with breakfast.   Yes Historical Provider, MD  hydrocortisone (ANUSOL-HC) 2.5 % rectal cream Place 1 application rectally 2 (two) times daily as needed for hemorrhoids.   Yes Historical Provider, MD  metoprolol (LOPRESSOR) 50 MG tablet Take 25 mg by mouth 2 (two) times daily.    Yes Historical Provider, MD  Pyridoxine HCl (  VITAMIN B-6 PO) Take 1 tablet by mouth daily.    Yes Historical Provider, MD  ranitidine (ZANTAC) 300 MG tablet Take 300 mg by mouth daily.   Yes Historical Provider, MD  simvastatin (ZOCOR) 40 MG tablet Take 40 mg by mouth every evening.   Yes Historical Provider, MD   Physical Exam: Filed Vitals:   11/11/12 1000 11/11/12 1030 11/11/12 1042 11/11/12 1100  BP: 151/75 156/123 152/86 175/77  Pulse: 79  75 79  Temp:      TempSrc:      Resp: 21  19 18   Height:      Weight:      SpO2:         General:  He looks systemically well. There is no central or peripheral cyanosis.  Eyes: No pallor. No jaundice.  ENT: No abnormalities.  Neck: No lymphadenopathy.  Cardiovascular: Heart sounds are present  and are in sinus rhythm with ectopics. There is no gallop rhythm. Jugular venous pressure is elevated to 4 cm above the angle of Louis. There are no murmurs. There is no peripheral pitting edema.  Respiratory: Lung fields show bilateral inspiratory crackles in the lower zones. There is no wheezing or bronchial breathing.  Abdomen: Soft, nontender. No masses.  Skin: No rash.  Musculoskeletal: No acute joint abnormalities.  Psychiatric: Appropriate affect.  Neurologic: Alert and orientated. No focal neurological signs.  Labs on Admission:  Basic Metabolic Panel:  Recent Labs Lab 11/11/12 0941  NA 139  K 3.9  CL 102  CO2 26  GLUCOSE 142*  BUN 32*  CREATININE 1.39*  CALCIUM 8.9       CBC:  Recent Labs Lab 11/11/12 0941  WBC 4.9  NEUTROABS 4.2  HGB 11.6*  HCT 35.6*  MCV 88.3  PLT 113*   Cardiac Enzymes:  Recent Labs Lab 11/11/12 0941  TROPONINI <0.30    BNP (last 3 results)  Recent Labs  11/11/12 0940  PROBNP 13370.0*      Radiological Exams on Admission: Dg Chest 2 View  11/11/2012  *RADIOLOGY REPORT*  Clinical Data: Shortness of breath, epigastric pain.  CHEST - 2 VIEW  Comparison: 11/06/2007  Findings: Prior CABG.  Heart is normal size.  Mild vascular congestion and peribronchial thickening.  Slight perihilar opacities could reflect atelectasis or edema.  No effusions.  No acute bony abnormality.  IMPRESSION: Peribronchial thickening.  Vascular congestion and perihilar atelectasis or edema.   Original Report Authenticated By: Charlett Nose, M.D.    Dg Lumbar Spine Complete  11/11/2012  *RADIOLOGY REPORT*  Clinical Data: Low back pain  LUMBAR SPINE - COMPLETE 4+ VIEW  Comparison: None.  Findings: Five lumbar-type vertebral bodies.  Mild straightening of the lumbar spine.  No evidence of fracture or dislocation.  Vertebral body heights are maintained.  Moderate multilevel degenerative changes.  Visualized bony pelvis appears intact.  Cholecystectomy  clips.  Vascular calcifications.  IMPRESSION: No fracture or dislocation is seen.  Moderate multilevel degenerative changes.   Original Report Authenticated By: Charline Bills, M.D.     EKG: Independently reviewed. Normal sinus rhythm without any ST-T wave changes. Nonspecific intraventricular block.  Assessment/Plan   1. Congestive heart failure. Recent use of nonsteroidal anti-inflammatory medications for back pain. 2. History of coronary artery disease, status post CABG in 2004. 3. Hypertension.  Plan: 1. Admit to telemetry. 2. Intravenous diuretics. 3. Increase ACE inhibitor. 4. Echocardiogram. 5. Cardiology consultation. Further recommendations will depend on patient's hospital progress.  Code Status: Full code.  Family Communication:  Discussed plan with patient at the bedside.   Disposition Plan: Home when medically stable.   Time spent: 45 minutes.  Wilson Singer [facility] Pager [phone]  If 7PM-7AM, please contact night-coverage [email or web address] Password [facility] 11/11/2012, 11:47 AM

## 2012-11-11 NOTE — ED Provider Notes (Signed)
History     CSN: [id number]  Arrival date & time 11/11/12  8469   First MD Initiated Contact with Patient 11/11/12 [phone]      Chief Complaint  Patient presents with  . Shortness of Breath    HPI Pt was seen at 0950.  Per pt and his family, c/o gradual onset and worsening of persistent SOB for the past 2 days. Pt states his symptoms began 2 days ago with SOB and one episode of CP, that resolved when he was driving himself to the hospital.  Pt states he went back home at that time and did not get evaluated in the ED.  Pt states he has not had any further episodes of CP, but continues to have SOB and generalized weakness/fatigue. SOB worsens with ambulation and laying down flat. Denies palpitations, no cough, no abd pain, no N/V/D, no fevers.  Pt also c/o gradual onset and persistence of constant low back "pain" for the past 2 weeks. Pt states the pain began after he was riding in a go cart with a young family member and they "stopped suddenly." Pt was evaluated by his PMD for same 10 days ago, rx NSAID and hydocodone with some improvement.  Pain worsens with palpation of the area and body position changes. Denies incont/retention of bowel or bladder, no saddle anesthesia, no focal motor weakness, no tingling/numbness in extremities.     Past Medical History  Diagnosis Date  . Hypertension   . Anemia   . Gout   . ED (erectile dysfunction)   . Hypercholesteremia   . CAD (coronary artery disease)   . Impaired fasting glucose   . Insomnia   . CTS (carpal tunnel syndrome)   . CHF (congestive heart failure)   . Myocardial infarction 1985  . Ischemic cardiomyopathy     Past Surgical History  Procedure Laterality Date  . Appendectomy    . Prostatectomy    . Cholecystectomy    . Tonsillectomy    . Colonoscopy    . Coronary artery bypass graft  2004  . Inguinal hernia repair       History  Substance Use Topics  . Smoking status: Former Games developer  . Smokeless tobacco: Not on file  .  Alcohol Use: Not on file      Review of Systems ROS: Statement: All systems negative except as marked or noted in the HPI; Constitutional: Negative for fever and chills. +generalized weakness/fatigue; ; Eyes: Negative for eye pain, redness and discharge. ; ; ENMT: Negative for ear pain, hoarseness, nasal congestion, sinus pressure and sore throat. ; ; Cardiovascular: +SOB, CP. Negative for palpitations, diaphoresis, and peripheral edema. ; ; Respiratory: Negative for cough, wheezing and stridor. ; ; Gastrointestinal: Negative for nausea, vomiting, diarrhea, abdominal pain, blood in stool, hematemesis, jaundice and rectal bleeding. . ; ; Genitourinary: Negative for dysuria, flank pain and hematuria. ; ; Musculoskeletal: +LBP. Negative for neck pain. Negative for swelling and trauma.; ; Skin: Negative for pruritus, rash, abrasions, blisters, bruising and skin lesion.; ; Neuro: Negative for headache, lightheadedness and neck stiffness. Negative for weakness, altered level of consciousness , altered mental status, extremity weakness, paresthesias, involuntary movement, seizure and syncope.       Allergies  Quinidine  Home Medications   Current Outpatient Rx  Name  Route  Sig  Dispense  Refill  . allopurinol (ZYLOPRIM) 100 MG tablet   Oral   Take 100 mg by mouth daily.         Marland Kitchen  Ascorbic Acid (VITAMIN C) 100 MG tablet   Oral   Take 100 mg by mouth daily.         Marland Kitchen aspirin 81 MG tablet   Oral   Take 81 mg by mouth daily.         . diclofenac (VOLTAREN) 75 MG EC tablet   Oral   Take 1 tablet (75 mg total) by mouth 2 (two) times daily.   28 tablet   0   . enalapril (VASOTEC) 10 MG tablet   Oral   Take 10 mg by mouth daily.         . ferrous sulfate 325 (65 FE) MG tablet   Oral   Take 325 mg by mouth daily with breakfast.         . hydrocortisone (ANUSOL-HC) 2.5 % rectal cream   Rectal   Place 1 application rectally 2 (two) times daily as needed for hemorrhoids.          . metoprolol (LOPRESSOR) 50 MG tablet   Oral   Take 25 mg by mouth 2 (two) times daily.          . Pyridoxine HCl (VITAMIN B-6 PO)   Oral   Take 1 tablet by mouth daily.          . ranitidine (ZANTAC) 300 MG tablet   Oral   Take 300 mg by mouth daily.         . simvastatin (ZOCOR) 40 MG tablet   Oral   Take 40 mg by mouth every evening.           BP 182/101  Pulse 87  Temp(Src) 97.4 F (36.3 C) (Oral)  Resp 20  Ht 5\' 10"  (1.778 m)  Wt 156 lb (70.761 kg)  BMI 22.38 kg/m2  SpO2 97%  Physical Exam 0955:  Physical examination:  Nursing notes reviewed; Vital signs and O2 SAT reviewed;  Constitutional: Well developed, Well nourished, Well hydrated, In no acute distress; Head:  Normocephalic, atraumatic; Eyes: EOMI, PERRL, No scleral icterus; ENMT: Mouth and pharynx normal, Mucous membranes moist; Neck: Supple, Full range of motion, No lymphadenopathy; Cardiovascular: Regular rate and rhythm, occasional ectopy. No gallop; Respiratory: Breath sounds coarse at bases bilaterally, No wheezes.  Speaking full sentences with ease, Normal respiratory effort/excursion; Chest: Nontender, Movement normal; Abdomen: Soft, Nontender, Nondistended, Normal bowel sounds; Genitourinary: No CVA tenderness; Spine:  No midline CS, TS, LS tenderness.  +TTP bilat lumbar paraspinal muscles.;; Extremities: Pulses normal, No tenderness, No edema, No calf edema or asymmetry.; Neuro: AA&Ox3, Major CN grossly intact.  Speech clear. No gross focal motor or sensory deficits in extremities.; Skin: Color normal, Warm, Dry.   ED Course  Procedures     MDM  MDM Reviewed: previous chart, nursing note and vitals Reviewed previous: labs and ECG Interpretation: labs, ECG and x-ray    Date: 11/11/2012  Rate: 86  Rhythm: normal sinus rhythm, occasional PVC's/PAC's  QRS Axis: left  Intervals: normal  ST/T Wave abnormalities: nonspecific ST/T changes lateral leads  Conduction Disutrbances:nonspecific  intraventricular conduction delay  Narrative Interpretation:   Old EKG Reviewed: changes noted; new STTW changes compared to previous EKG dated 08/14/2002.   Results for orders placed during the hospital encounter of 11/11/12  CBC WITH DIFFERENTIAL      Result Value Range   WBC 4.9  4.0 - 10.5 K/uL   RBC 4.03 (*) 4.22 - 5.81 MIL/uL   Hemoglobin 11.6 (*) 13.0 - 17.0 g/dL   HCT 16.1 (*)  39.0 - 52.0 %   MCV 88.3  78.0 - 100.0 fL   MCH 28.8  26.0 - 34.0 pg   MCHC 32.6  30.0 - 36.0 g/dL   RDW 16.1  09.6 - 04.5 %   Platelets 113 (*) 150 - 400 K/uL   Neutrophils Relative 86 (*) 43 - 77 %   Neutro Abs 4.2  1.7 - 7.7 K/uL   Lymphocytes Relative 9 (*) 12 - 46 %   Lymphs Abs 0.4 (*) 0.7 - 4.0 K/uL   Monocytes Relative 5  3 - 12 %   Monocytes Absolute 0.3  0.1 - 1.0 K/uL   Eosinophils Relative 0  0 - 5 %   Eosinophils Absolute 0.0  0.0 - 0.7 K/uL   Basophils Relative 0  0 - 1 %   Basophils Absolute 0.0  0.0 - 0.1 K/uL  BASIC METABOLIC PANEL      Result Value Range   Sodium 139  135 - 145 mEq/L   Potassium 3.9  3.5 - 5.1 mEq/L   Chloride 102  96 - 112 mEq/L   CO2 26  19 - 32 mEq/L   Glucose, Bld 142 (*) 70 - 99 mg/dL   BUN 32 (*) 6 - 23 mg/dL   Creatinine, Ser 4.09 (*) 0.50 - 1.35 mg/dL   Calcium 8.9  8.4 - 81.1 mg/dL   GFR calc non Af Amer 45 (*) >90 mL/min   GFR calc Af Amer 52 (*) >90 mL/min  TROPONIN I      Result Value Range   Troponin I <0.30  <0.30 ng/mL  PRO B NATRIURETIC PEPTIDE      Result Value Range   Pro B Natriuretic peptide (BNP) 13370.0 (*) 0 - 450 pg/mL   Dg Chest 2 View 11/11/2012  *RADIOLOGY REPORT*  Clinical Data: Shortness of breath, epigastric pain.  CHEST - 2 VIEW  Comparison: 11/06/2007  Findings: Prior CABG.  Heart is normal size.  Mild vascular congestion and peribronchial thickening.  Slight perihilar opacities could reflect atelectasis or edema.  No effusions.  No acute bony abnormality.  IMPRESSION: Peribronchial thickening.  Vascular congestion and  perihilar atelectasis or edema.   Original Report Authenticated By: Charlett Nose, M.D.    Dg Lumbar Spine Complete 11/11/2012  *RADIOLOGY REPORT*  Clinical Data: Low back pain  LUMBAR SPINE - COMPLETE 4+ VIEW  Comparison: None.  Findings: Five lumbar-type vertebral bodies.  Mild straightening of the lumbar spine.  No evidence of fracture or dislocation.  Vertebral body heights are maintained.  Moderate multilevel degenerative changes.  Visualized bony pelvis appears intact.  Cholecystectomy clips.  Vascular calcifications.  IMPRESSION: No fracture or dislocation is seen.  Moderate multilevel degenerative changes.   Original Report Authenticated By: Charline Bills, M.D.     1130:  BNP elevated, no old to compare; will dose IV lasix.  Pt ambulated with O2 Sats dropping to 90's. Dx and testing d/w pt and family.  Questions answered.  Verb understanding, agreeable to admit.  T/C to [facility]. Karilyn Cota, case discussed, including:  HPI, pertinent PM/SHx, VS/PE, dx testing, ED course and treatment:  Agreeable to admit, requests he will come to the ED for eval.              Laray Anger, DO 11/13/12 2019

## 2012-11-11 NOTE — ED Notes (Signed)
Pt presents to er this am with c/o sob, weakness, bilateral lower back pain, pt states that he woke up with chest pain and sob Friday night, had gotten half way to hospital, pain went away so pt turned around and went back home, since Friday night pt has continued to experience sob, weakness

## 2012-11-11 NOTE — ED Notes (Signed)
Ambulatory in department with mild shortness of breath with lowest O2 saturation recorded at 91% on room air.

## 2012-11-11 NOTE — ED Notes (Signed)
Ambulatory to restroom, no difficulty noted.

## 2012-11-12 ENCOUNTER — Inpatient Hospital Stay (HOSPITAL_COMMUNITY): Payer: Medicare Other

## 2012-11-12 ENCOUNTER — Telehealth: Payer: Self-pay | Admitting: Family Medicine

## 2012-11-12 ENCOUNTER — Other Ambulatory Visit (HOSPITAL_COMMUNITY): Payer: Medicare Other

## 2012-11-12 DIAGNOSIS — I059 Rheumatic mitral valve disease, unspecified: Secondary | ICD-10-CM

## 2012-11-12 DIAGNOSIS — I10 Essential (primary) hypertension: Secondary | ICD-10-CM

## 2012-11-12 DIAGNOSIS — E876 Hypokalemia: Secondary | ICD-10-CM | POA: Diagnosis not present

## 2012-11-12 DIAGNOSIS — I25810 Atherosclerosis of coronary artery bypass graft(s) without angina pectoris: Secondary | ICD-10-CM

## 2012-11-12 LAB — CBC
HCT: 38.6 % — ABNORMAL LOW (ref 39.0–52.0)
MCHC: 32.9 g/dL (ref 30.0–36.0)
MCV: 88.1 fL (ref 78.0–100.0)
RDW: 14.1 % (ref 11.5–15.5)

## 2012-11-12 LAB — COMPREHENSIVE METABOLIC PANEL
ALT: 40 U/L (ref 0–53)
Alkaline Phosphatase: 103 U/L (ref 39–117)
GFR calc Af Amer: 43 mL/min — ABNORMAL LOW (ref >90)
Glucose, Bld: 118 mg/dL — ABNORMAL HIGH (ref 70–99)
Potassium: 3.4 mEq/L — ABNORMAL LOW (ref 3.5–5.1)
Sodium: 144 mEq/L (ref 135–145)
Total Protein: 7.1 g/dL (ref 6.0–8.3)

## 2012-11-12 LAB — TROPONIN I: Troponin I: <0.3 ng/mL (ref <0.30)

## 2012-11-12 MED ORDER — ACETAMINOPHEN 325 MG PO TABS: 650.0000 mg | ORAL_TABLET | Freq: Four times a day (QID) | ORAL | Status: DC | PRN

## 2012-11-12 MED ORDER — POTASSIUM CHLORIDE CRYS ER 10 MEQ PO TBCR
40.0000 meq | EXTENDED_RELEASE_TABLET | Freq: Every day | ORAL | Status: DC
  Administered 2012-11-12 – 2012-11-14 (×3): 40 meq via ORAL
  Filled 2012-11-12 (×3): qty 4

## 2012-11-12 MED ORDER — SODIUM CHLORIDE 0.9 % IJ SOLN: 3.0000 mL | INTRAMUSCULAR | Status: DC | PRN

## 2012-11-12 MED ORDER — ENOXAPARIN SODIUM 40 MG/0.4ML ~~LOC~~ SOLN
40.0000 mg | Freq: Every day | SUBCUTANEOUS | Status: DC
  Administered 2012-11-12 – 2012-11-13 (×2): 40 mg via SUBCUTANEOUS
  Filled 2012-11-12 (×2): qty 0.4

## 2012-11-12 MED ORDER — SODIUM CHLORIDE 0.9 % IV SOLN
250.0000 mL | INTRAVENOUS | Status: DC | PRN
  Administered 2012-11-12: 250 mL via INTRAVENOUS

## 2012-11-12 NOTE — Clinical Documentation Improvement (Signed)
CHF DOCUMENTATION CLARIFICATION QUERY  THIS DOCUMENT IS NOT A PERMANENT PART OF THE MEDICAL RECORD  TO RESPOND TO THE THIS QUERY, FOLLOW THE INSTRUCTIONS BELOW:  1. If needed, update documentation for the patient's encounter via the notes activity.  2. Access this query again and click edit on the In [employer].  3. After updating, or not, click F2 to complete all highlighted (required) fields concerning your review. Select "additional documentation in the medical record" OR "no additional documentation provided".  4. Click Sign note button.  5. The deficiency will fall out of your In Basket *Please let us know if you are not able to complete this workflow by phone or e-mail (listed below).  Please update your documentation within the medical record to reflect your response to this query.                                                                                    11/12/12  Dear [facility]. [facility],  In a better effort to capture your patient's severity of illness, reflect appropriate length of stay and utilization of resources, a review of the patient medical record has revealed the following indicators the diagnosis of Heart Failure.    Based on your clinical judgment, please clarify type and document in a progress note and/or discharge summary the clinical condition associated with the following supporting information:  In responding to this query please exercise your independent judgment.  The fact that a query is asked, does not imply that any particular answer is desired or expected.  Possible Clinical Conditions?  Chronic Systolic Congestive Heart Failure Chronic Diastolic Congestive Heart Failure Chronic Systolic & Diastolic Congestive Heart Failure Acute Systolic Congestive Heart Failure Acute Diastolic Congestive Heart Failure Acute Systolic & Diastolic Congestive Heart Failure Acute on Chronic Systolic Congestive Heart Failure Acute on Chronic Diastolic  Congestive Heart Failure Acute on Chronic Systolic & Diastolic  Congestive Heart Failure Other Condition________________________________________ Cannot Clinically Determine  Supporting Information:  Risk Factors: (As per notes) "Acute CHF ", "CAD"  Signs & Symptoms: (As per notes) "Acute CHF ", "dyspnea","Ishcemic acrdiomyopathy" Diagnostics: PROBNP 11/11/12 0940 = 13370.0*  11/12/12 0500 = 16206.0*   2D Echo = 11-12-12 Study Conclusions  - Left ventricle: Diffuse hypokinesis with inferior akinesis  The cavity size was moderately &dilated Wall thickness was increased in a pattern of severe LVH The estimated ejection fraction was 30%  - Aortic valve: Trivial regurgitation Valve area: 2 35cm 2(VTI) Valve area: 2 05cm 2 (Vmax)  - Mitral valve: Mild regurgitation  - Left atrium: The atrium was mildly &dilated  - Atrial septum: No defect or patent foramen ovale was identified    Treatment:  furosemide (LASIX) injection 40 mg [[id number]]    Ordered Dose: 40 mg Route: Intravenous Frequency: Once   furosemide (LASIX) injection 40 mg [[id number]]    Ordered Dose: 40 mg Route: Intravenous Frequency: Every 12 hours     Reviewed: additional documentation in the medical record  Thank You,  Joanette Gula Delk RN, BSN, CCDSClinical Documentation Specialist: [phone] Pager Health Information Management [place]

## 2012-11-12 NOTE — Progress Notes (Signed)
*  PRELIMINARY RESULTS* Echocardiogram 2D Echocardiogram has been performed.  Evan Melendez 11/12/2012, 11:10 AM

## 2012-11-12 NOTE — Consult Note (Addendum)
CARDIOLOGY CONSULT NOTE  Patient ID: Evan Melendez MRN: [id number] DOB/AGE: 01-07-1926 79 y.o.  Admit date: 11/11/2012 Referring Physician: PTH Primary PhysicianLUKING,W S, MD Primary Cardiologist: Evan Melendez Reason for Consultation:Dyspnea with history CABG, CHF.  Active Problems:   Essential hypertension, benign   CAD (coronary artery disease) of artery bypass graft   CHF (congestive heart failure)  HPI: Evan Melendez is an 79 year old patient of Evan Melendez with history of CAD, coronary artery bypass grafting in 2004 with EF of 44% per cardiac cath prior to CABG. He has been lost to followup for 8 years. The patient is been followed by his primary care physician EvanLuking. Approximately 2 days prior to admission the patient began to have trouble breathing, with dullness in his chest and throat. As a result of ongoing symptoms he presented to the emergency room. The patient had also been having low back pain and had been on NSAIDs for approximately 2 weeks. He denied chest pain, lower extremity edema, or abdominal distention.    On arrival to the emergency room, chest x-ray revealed vascular congestion with perihilar atelectasis or edema. Pro BNP was elevated at 16,206.he was found to be hypertensive with a blood pressure 182/101, O2 sat 97% with a heart rate of 87 beats per minute. TSH was also found to be elevated at 4.745. The patient was treated with 40 mg of IV Lasix, was also given metoprolol 25 mg, and aspirin. He enzymes have been cycled and found to be negative x3. Echocardiogram has been ordered. He is diuresis 4470 cc of urine since admission. His breathing and feeling much better.  Review of systems complete and found to be negative unless listed above   Past Medical History  Diagnosis Date  . Hypertension   . Anemia   . Gout   . ED (erectile dysfunction)   . Hypercholesteremia   . CAD (coronary artery disease)   . Impaired fasting glucose   . Insomnia   . CTS (carpal  tunnel syndrome)   . CHF (congestive heart failure)   . Myocardial infarction 1985  . Ischemic cardiomyopathy   . DDD (degenerative disc disease), lumbar     Family History  Problem Relation Age of Onset  . Dementia Father   . Rheum arthritis Mother     History   Social History  . Marital Status: Widowed    Spouse Name: N/A    Number of Children: N/A  . Years of Education: N/A   Occupational History  . Not on file.   Social History Main Topics  . Smoking status: Former Games developer  . Smokeless tobacco: Not on file  . Alcohol Use: No  . Drug Use: No  . Sexually Active: Not on file   Other Topics Concern  . Not on file   Social History Narrative  . No narrative on file    Past Surgical History  Procedure Laterality Date  . Appendectomy    . Prostatectomy    . Cholecystectomy    . Tonsillectomy    . Colonoscopy    . Inguinal hernia repair    . Coronary artery bypass graft  2003     Prescriptions prior to admission  Medication Sig Dispense Refill  . allopurinol (ZYLOPRIM) 100 MG tablet Take 100 mg by mouth daily.      . Ascorbic Acid (VITAMIN C) 100 MG tablet Take 100 mg by mouth daily.      Marland Kitchen aspirin 81 MG tablet Take 81 mg by mouth daily.      Marland Kitchen  diclofenac (VOLTAREN) 75 MG EC tablet Take 1 tablet (75 mg total) by mouth 2 (two) times daily.  28 tablet  0  . enalapril (VASOTEC) 10 MG tablet Take 10 mg by mouth daily.      . ferrous sulfate 325 (65 FE) MG tablet Take 325 mg by mouth daily with breakfast.      . hydrocortisone (ANUSOL-HC) 2.5 % rectal cream Place 1 application rectally 2 (two) times daily as needed for hemorrhoids.      . metoprolol (LOPRESSOR) 50 MG tablet Take 25 mg by mouth 2 (two) times daily.       . Pyridoxine HCl (VITAMIN B-6 PO) Take 1 tablet by mouth daily.       . ranitidine (ZANTAC) 300 MG tablet Take 300 mg by mouth daily.      . simvastatin (ZOCOR) 40 MG tablet Take 40 mg by mouth every evening.       Cardiac Cath: 12/08/2002 DIAGNOSES:   1. Two-vessel obstructive coronary artery disease with an occluded right  coronary artery and a proximally calcified left anterior descending with  50 and 75% stenoses. The right coronary artery fills by well-developed  collaterals from the circumflex and left anterior descending.  2. Decreased ejection fraction of 42% with inferior basal akinesis and 1+  mitral regurgitation.  3. Peripheral vascular disease as outlined with a 50% left renal artery  stenosis and a 50% left iliac stenosis.  CABG: 08/13/2002 POSTOPERATIVE DIAGNOSIS: Severe three-vessel coronary artery disease with  atypical chest pain and abnormal stress Cardiolite examination.  PROCEDURE: Median sternotomy for coronary artery bypass grafting x3 (left  internal mammary artery to the distal left anterior descending coronary  artery, saphenous vein graft to the circumflex marginal branch, saphenous  vein graft to the distal right coronary artery).   Physical Exam:  General: Well developed, well nourished, in no acute distress Head: Eyes PERRLA, No xanthomas.   Normal cephalic and atramatic  Lungs:Biateral crackles, without wheezes or coughing. Heart: HRRR S1 S2,distant with soft 1/6 systolic murmur.   Pulses are 2+ & equal.            No carotid bruit. No JVD.  No abdominal bruits. No femoral bruits. Abdomen: Bowel sounds are positive, abdomen soft and non-tender without masses or                  Hernia'Melendez noted. Msk:  Back normal, . Normal strength and tone for age. Extremities: No clubbing, cyanosis or edema.  DP +1 Neuro: Alert and oriented X 3. Psych:  Good affect, responds appropriately  Labs:   Lab Results  Component Value Date   WBC 6.3 11/12/2012   HGB 12.7* 11/12/2012   HCT 38.6* 11/12/2012   MCV 88.1 11/12/2012   PLT 153 11/12/2012    Recent Labs Lab 11/12/12 0500  NA 144  K 3.4*  CL 102  CO2 29  BUN 33*  CREATININE 1.63*  CALCIUM 9.5  PROT 7.1  BILITOT 0.9  ALKPHOS 103  ALT 40  AST 40*   GLUCOSE 118*   Lab Results  Component Value Date   TROPONINI <0.30 11/11/2012      BNP (last 3 results)  Recent Labs  11/11/12 0940 11/12/12 0500  PROBNP 13370.0* 16206.0*      Radiology: Dg Chest 1 View  11/12/2012  *RADIOLOGY REPORT*  Clinical Data: CHF.  CHEST - 1 VIEW  Comparison: 11/11/2012  Findings: No overt edema identified.  Stable cardiomegaly noted. No evidence of pleural  fluid or focal pulmonary consolidation.  IMPRESSION: No pulmonary edema identified.   Original Report Authenticated By: Irish Lack, M.D.    Dg Chest 2 View  11/11/2012  *RADIOLOGY REPORT*  Clinical Data: Shortness of breath, epigastric pain.  CHEST - 2 VIEW  Comparison: 11/06/2007  Findings: Prior CABG.  Heart is normal size.  Mild vascular congestion and peribronchial thickening.  Slight perihilar opacities could reflect atelectasis or edema.  No effusions.  No acute bony abnormality.  IMPRESSION: Peribronchial thickening.  Vascular congestion and perihilar atelectasis or edema.   Original Report Authenticated By: Charlett Nose, M.D.    Dg Lumbar Spine Complete  11/11/2012  *RADIOLOGY REPORT*  Clinical Data: Low back pain  LUMBAR SPINE - COMPLETE 4+ VIEW  Comparison: None.  Findings: Five lumbar-type vertebral bodies.  Mild straightening of the lumbar spine.  No evidence of fracture or dislocation.  Vertebral body heights are maintained.  Moderate multilevel degenerative changes.  Visualized bony pelvis appears intact.  Cholecystectomy clips.  Vascular calcifications.  IMPRESSION: No fracture or dislocation is seen.  Moderate multilevel degenerative changes.   Original Report Authenticated By: Charline Bills, M.D.    EKG:NSR with intraventricular conduction delay, with frequent PVC.Melendez.  ASSESSMENT AND PLAN:   1. Acute CHF: Patient has a history of ischemic heart myopathy in the setting of CAD with coronary artery bypass grafting, as last recorded ejection fraction 44% per cardiac catheterization 2004  prior to CABG. The patient is diuresis greater than 4000 cc on Lasix 40 mg twice a day. Echocardiogram has been ordered for  revaluation of LV function. He has been on a lot of NSAIDs over the last month secondary to chronic low back pain. This may have been contributing, however with known history of ischemic cardiomyopathy, the patient may also have had worsening of his ejection fraction. He has been lost to followup for 8 years. Recommended continued diuresis, ACE inhibitor, aspirin, and continue beta blocker. We will make further recommendations after echocardiogram has been completed for further treatment strategy. Recommend stopping heparin, and changing to VTE prophylaxis only.   2.CAD: History of CABG in 2004. He has had no complaints of chest pain or pressure. He has been lost to cardiac follow up and would benefit from ischemic testing should Echo demonstrated significant decrease in EF compared to prior evaluation.   3.Hypercholesterolemia: Currently on statin, simvastatin 40 mg daily. \  4. Hypertension: Elevated on admission but is now better controlled, on ACE inhibitor and beta blocker along with diuresis. We will continue to monitor with adjustment of ACE inhibitor should this be necessary for better maintenance of therapeutic range.  5. Anemia: He was noted to have a hemoglobin 11.6 with hematocrit of 35.6, and this may be delusional in the setting of fluid overload. Would continue to monitor this. Does he have a history of iron deficiency anemia as he  is on iron replacement?    Signed: Bettey Mare. Lyman Bishop NP [facility] 11/12/2012, 9:16 AM Co-Sign MD  Patient examined chart reviewed.  Discussed care with daughter who lives in [place].  Given advanced age favor conservative approach.  Echo to assess EF  Previous IMI and EF 44% If markedly depressed can consider right and left heart cath.  If similar to 2004 can risk stratify with lexiscan myovue in am.  Has had good  diuresis.  Still with basilar crackles.  R/O with no evidence of acute coronary syndrome  Agree with d/c heparin and change to DVT prophylaxis Presentation likely  with acute systolic heart failure No recent EF Reassess with echo Etiology of acute systolic CHF likely ischemic [place]ton [place]s

## 2012-11-12 NOTE — Telephone Encounter (Signed)
Pt's daughter is stating that Evan Melendez was put in to the hospital on 4/20, with Congestive Heart Failure. Had an Echo this morning and will later find out if he needs a chemical stress test here or go to [facility], [place] for a heart Cath.

## 2012-11-12 NOTE — Progress Notes (Signed)
Patient reported having pain from his Rt. Ankle all the way up the leg to the groin, unable to stand during the painful episode, upon assessment the pain has gone away and both legs are equal in size, no soreness noted to Rt. Leg or thigh, PA notified, orders received and carried out.

## 2012-11-12 NOTE — Progress Notes (Signed)
ANTICOAGULATION CONSULT NOTE - Initial Consult  Pharmacy Consult for Lovenox Indication: VTE prophylaxis  Allergies  Allergen Reactions  . Quinidine Other (See Comments)    Chest Pain    Patient Measurements: Height: 5\' 10"  (177.8 cm) Weight: 156 lb (70.761 kg) IBW/kg (Calculated) : 73  Vital Signs: Temp: 97.4 F (36.3 C) (04/21 0442) Temp src: Oral (04/21 0442) BP: 159/81 mmHg (04/21 0442) Pulse Rate: 79 (04/21 0442)  Labs:  Recent Labs  11/11/12 0941 11/11/12 1230 11/11/12 1727 11/11/12 2339 11/12/12 0500  HGB 11.6*  --   --   --  12.7*  HCT 35.6*  --   --   --  38.6*  PLT 113*  --   --   --  153  CREATININE 1.39*  --   --   --  1.63*  TROPONINI <0.30 <0.30 <0.30 <0.30  --     Estimated Creatinine Clearance: 33.8 ml/min (by C-G formula based on Cr of 1.63).   Medical History: Past Medical History  Diagnosis Date  . Hypertension   . Anemia   . Gout   . ED (erectile dysfunction)   . Hypercholesteremia   . CAD (coronary artery disease)   . Impaired fasting glucose   . Insomnia   . CTS (carpal tunnel syndrome)   . CHF (congestive heart failure)   . Myocardial infarction 1985  . Ischemic cardiomyopathy   . DDD (degenerative disc disease), lumbar     Medications:  Scheduled:  . allopurinol  100 mg Oral Daily  . [COMPLETED] aspirin      . aspirin EC  81 mg Oral Daily  . enalapril  10 mg Oral BID  . enoxaparin (LOVENOX) injection  40 mg Subcutaneous Daily  . famotidine  20 mg Oral Daily  . ferrous sulfate  325 mg Oral Q breakfast  . furosemide  40 mg Intravenous Q12H  . metoprolol  25 mg Oral BID  . potassium chloride  40 mEq Oral Daily  . simvastatin  40 mg Oral QPM  . sodium chloride  3 mL Intravenous Q12H  . vitamin C  250 mg Oral Daily  . [DISCONTINUED] enalapril  10 mg Oral Daily  . [DISCONTINUED] heparin  5,000 Units Subcutaneous Q8H  . [DISCONTINUED] vitamin C  100 mg Oral Daily    Assessment: 79yo male with clcr > 35ml/min Goal of  Therapy:  VTE prophylaxis Monitor platelets by anticoagulation protocol: Yes   Plan:  Lovenox 40mg  SQ q24hrs Monitor platelets and SCr  Margo Aye, Caileen Veracruz A 11/12/2012,11:43 AM

## 2012-11-12 NOTE — Progress Notes (Addendum)
[facility] PROGRESS NOTE  Evan Melendez ZOX:[id number] DOB: 28-Jun-1926 DOA: 11/11/2012 PCP: Harlow Asa, MD  Assessment/Plan:  Acute Congestive heart failure in pt with hx ischemic cardiomyopathy and last EF44% 8 years ago. Pt has had recent use of nonsteroidal anti-inflammatory medications for back pain. History of coronary artery disease, status post CABG in 2004. Improved. Volume status -4L. Wt 70.6kg yesterday. ProBNP trending up.  Appreciate cardiology assistance. Will continue to diurese with IV lasix, give asa, ACE and BB. Results of echo pending. Cards to follow   2. Acute renal failure: no evidence of hx of same. Likely related to lasix in setting of ACE inhibitor. Creatinine trending up somewhat today. Will continue to monitor closely. Anticipate discontinuing IV lasix after tonight's dose. Expect renal function to improve after.   3. CAD: CABG in 2004. No CP. Trop neg to date. Monitor on tele.   4. HTN: better control since admission but still only fair control. SBP range 131-163. Monitor closely.   5. Anemia:  Normocytic. hx of same. On iron. Suspect related to chronic disease. Currently stable and no s/sx bleeding.   6. Hypokalemia: mild. Likely related to lasix. Will replete and monitor given continued IV lasix.  Code Status: DNR Family Communication: family at bedside Disposition Plan: home when ready.    Consultants:  Dr. Eden Emms cardiology  Procedures:  none  Antibiotics:  none  HPI/Subjective: Up in chair smiling. Reports feeling much better  Objective: Filed Vitals:   11/11/12 1556 11/11/12 2030 11/11/12 2035 11/12/12 0442  BP: 145/63  131/67 159/81  Pulse:  75 75 79  Temp:   97.7 F (36.5 C) 97.4 F (36.3 C)  TempSrc:   Oral Oral  Resp:  20 20 20   Height:      Weight:      SpO2:  96% 93% 93%    Intake/Output Summary (Last 24 hours) at 11/12/12 1124 Last data filed at 11/12/12 0500  Gross per 24 hour  Intake    480 ml  Output   4950  ml  Net  -4470 ml   Filed Weights   11/11/12 0938  Weight: 70.761 kg (156 lb)    Exam:   General:  Alert well nourished NAD  Cardiovascular: RRR no gallup no rub, no LE edema  Respiratory: normal effort fine crackles bilateral bases otherwise BS clear bilaterally  Abdomen: soft +BS non-tender to palpation   Musculoskeletal: no clubbing no cyanosis.    Data Reviewed: Basic Metabolic Panel:  Recent Labs Lab 11/11/12 0941 11/12/12 0500  NA 139 144  K 3.9 3.4*  CL 102 102  CO2 26 29  GLUCOSE 142* 118*  BUN 32* 33*  CREATININE 1.39* 1.63*  CALCIUM 8.9 9.5   Liver Function Tests:  Recent Labs Lab 11/12/12 0500  AST 40*  ALT 40  ALKPHOS 103  BILITOT 0.9  PROT 7.1  ALBUMIN 4.2   No results found for this basename: LIPASE, AMYLASE,  in the last 168 hours No results found for this basename: AMMONIA,  in the last 168 hours CBC:  Recent Labs Lab 11/11/12 0941 11/12/12 0500  WBC 4.9 6.3  NEUTROABS 4.2  --   HGB 11.6* 12.7*  HCT 35.6* 38.6*  MCV 88.3 88.1  PLT 113* 153   Cardiac Enzymes:  Recent Labs Lab 11/11/12 0941 11/11/12 1230 11/11/12 1727 11/11/12 2339  TROPONINI <0.30 <0.30 <0.30 <0.30   BNP (last 3 results)  Recent Labs  11/11/12 0940 11/12/12 0500  PROBNP 13370.0* 16206.0*  CBG: No results found for this basename: GLUCAP,  in the last 168 hours  No results found for this or any previous visit (from the past 240 hour(s)).   Studies: Dg Chest 1 View  11/12/2012  *RADIOLOGY REPORT*  Clinical Data: CHF.  CHEST - 1 VIEW  Comparison: 11/11/2012  Findings: No overt edema identified.  Stable cardiomegaly noted. No evidence of pleural fluid or focal pulmonary consolidation.  IMPRESSION: No pulmonary edema identified.   Original Report Authenticated By: Irish Lack, M.D.    Dg Chest 2 View  11/11/2012  *RADIOLOGY REPORT*  Clinical Data: Shortness of breath, epigastric pain.  CHEST - 2 VIEW  Comparison: 11/06/2007  Findings: Prior CABG.   Heart is normal size.  Mild vascular congestion and peribronchial thickening.  Slight perihilar opacities could reflect atelectasis or edema.  No effusions.  No acute bony abnormality.  IMPRESSION: Peribronchial thickening.  Vascular congestion and perihilar atelectasis or edema.   Original Report Authenticated By: Charlett Nose, M.D.    Dg Lumbar Spine Complete  11/11/2012  *RADIOLOGY REPORT*  Clinical Data: Low back pain  LUMBAR SPINE - COMPLETE 4+ VIEW  Comparison: None.  Findings: Five lumbar-type vertebral bodies.  Mild straightening of the lumbar spine.  No evidence of fracture or dislocation.  Vertebral body heights are maintained.  Moderate multilevel degenerative changes.  Visualized bony pelvis appears intact.  Cholecystectomy clips.  Vascular calcifications.  IMPRESSION: No fracture or dislocation is seen.  Moderate multilevel degenerative changes.   Original Report Authenticated By: Charline Bills, M.D.     Scheduled Meds: . allopurinol  100 mg Oral Daily  . aspirin EC  81 mg Oral Daily  . enalapril  10 mg Oral BID  . enoxaparin (LOVENOX) injection  40 mg Subcutaneous Daily  . famotidine  20 mg Oral Daily  . ferrous sulfate  325 mg Oral Q breakfast  . furosemide  40 mg Intravenous Q12H  . metoprolol  25 mg Oral BID  . potassium chloride  40 mEq Oral Daily  . simvastatin  40 mg Oral QPM  . sodium chloride  3 mL Intravenous Q12H  . vitamin C  250 mg Oral Daily   Continuous Infusions:  Time spent: 35 minutes    [facility] M  [facility]  If 7PM-7AM, please contact night-coverage a [email or web address], password [facility] 11/12/2012, 11:24 AM  LOS: 1 day   Attending note  Chart reviewed. Patient interviewed and examined. Discussed with Dr. Brett Canales looking. Echocardiogram shows ejection fraction of 30% with severe LVH. Will make patient n.p.o. after midnight in anticipation of stress test. Doppler of the leg negative. monitor creatinine. May have to adjust ACE inhibitor and diuretics  accordingly if creatinine worsens.appreciate cardiology's assistance.   Crista Curb, M.D.

## 2012-11-13 DIAGNOSIS — R404 Transient alteration of awareness: Secondary | ICD-10-CM

## 2012-11-13 DIAGNOSIS — R410 Disorientation, unspecified: Secondary | ICD-10-CM | POA: Diagnosis not present

## 2012-11-13 DIAGNOSIS — N179 Acute kidney failure, unspecified: Secondary | ICD-10-CM

## 2012-11-13 LAB — MAGNESIUM: Magnesium: 2.2 mg/dL (ref 1.5–2.5)

## 2012-11-13 LAB — CBC
HCT: 37.6 % — ABNORMAL LOW (ref 39.0–52.0)
MCV: 87.4 fL (ref 78.0–100.0)
Platelets: 139 10*3/uL — ABNORMAL LOW (ref 150–400)
RBC: 4.3 MIL/uL (ref 4.22–5.81)
RDW: 14 % (ref 11.5–15.5)
WBC: 6.5 10*3/uL (ref 4.0–10.5)

## 2012-11-13 LAB — BASIC METABOLIC PANEL
BUN: 34 mg/dL — ABNORMAL HIGH (ref 6–23)
CO2: 31 mEq/L (ref 19–32)
Chloride: 101 mEq/L (ref 96–112)
GFR calc Af Amer: 35 mL/min — ABNORMAL LOW (ref >90)
Potassium: 3.6 mEq/L (ref 3.5–5.1)

## 2012-11-13 LAB — VITAMIN B12: Vitamin B-12: 297 pg/mL (ref 211–911)

## 2012-11-13 MED ORDER — HALOPERIDOL LACTATE 5 MG/ML IJ SOLN
2.0000 mg | Freq: Four times a day (QID) | INTRAMUSCULAR | Status: DC | PRN
  Administered 2012-11-13: 2 mg via INTRAVENOUS
  Filled 2012-11-13 (×2): qty 1

## 2012-11-13 MED ORDER — MAGNESIUM SULFATE 40 MG/ML IJ SOLN
2.0000 g | Freq: Once | INTRAMUSCULAR | Status: AC
  Administered 2012-11-13: 2 g via INTRAVENOUS
  Filled 2012-11-13: qty 50

## 2012-11-13 MED ORDER — HALOPERIDOL LACTATE 5 MG/ML IJ SOLN
5.0000 mg | Freq: Once | INTRAMUSCULAR | Status: AC
  Administered 2012-11-13: 5 mg via INTRAVENOUS

## 2012-11-13 MED ORDER — ENALAPRIL MALEATE 5 MG PO TABS
5.0000 mg | ORAL_TABLET | Freq: Two times a day (BID) | ORAL | Status: DC
Start: 2012-11-13 — End: 2012-11-14
  Administered 2012-11-13 – 2012-11-14 (×2): 5 mg via ORAL
  Filled 2012-11-13 (×2): qty 1

## 2012-11-13 MED ORDER — POTASSIUM CHLORIDE CRYS ER 20 MEQ PO TBCR
40.0000 meq | EXTENDED_RELEASE_TABLET | ORAL | Status: AC
  Administered 2012-11-13 (×2): 40 meq via ORAL
  Filled 2012-11-13 (×2): qty 2

## 2012-11-13 MED ORDER — LORAZEPAM 2 MG/ML IJ SOLN: 0.5000 mg | INTRAMUSCULAR | Status: DC | PRN

## 2012-11-13 MED ORDER — CARVEDILOL 12.5 MG PO TABS
12.5000 mg | ORAL_TABLET | Freq: Two times a day (BID) | ORAL | Status: DC
  Administered 2012-11-14: 12.5 mg via ORAL
  Filled 2012-11-13: qty 1

## 2012-11-13 MED ORDER — HALOPERIDOL LACTATE 5 MG/ML IJ SOLN
5.0000 mg | Freq: Once | INTRAMUSCULAR | Status: AC
  Administered 2012-11-13: 5 mg via INTRAVENOUS
  Filled 2012-11-13: qty 1

## 2012-11-13 MED ORDER — FUROSEMIDE 40 MG PO TABS
40.0000 mg | ORAL_TABLET | Freq: Every day | ORAL | Status: DC
  Administered 2012-11-13 – 2012-11-14 (×2): 40 mg via ORAL
  Filled 2012-11-13 (×2): qty 1

## 2012-11-13 MED ORDER — HALOPERIDOL LACTATE 5 MG/ML IJ SOLN: 5.0000 mg | Freq: Once | INTRAMUSCULAR | Status: AC

## 2012-11-13 MED ORDER — ENOXAPARIN SODIUM 30 MG/0.3ML ~~LOC~~ SOLN
30.0000 mg | Freq: Every day | SUBCUTANEOUS | Status: DC
  Administered 2012-11-14: 30 mg via SUBCUTANEOUS
  Filled 2012-11-13: qty 0.3

## 2012-11-13 MED ORDER — DIAZEPAM 2 MG PO TABS
2.0000 mg | ORAL_TABLET | Freq: Four times a day (QID) | ORAL | Status: DC | PRN
  Administered 2012-11-13 (×2): 2 mg via ORAL
  Filled 2012-11-13 (×2): qty 1

## 2012-11-13 NOTE — Progress Notes (Signed)
Pt. Very combative this morning, require wrist restraints for four hours, and chemical restrains now prn every 6 hours. Pt. Has been calm, oriented and relaxed since about 1230. Pt. Family very cooperative with care and explained reasons behind actions also spoke with MD.

## 2012-11-13 NOTE — Care Management Note (Unsigned)
    Page 1 of 1   11/13/2012     5:00:12 PM   CARE MANAGEMENT NOTE 11/13/2012  Patient:  ARVON, SCHREINER   Account Number:  [id number]  Date Initiated:  11/13/2012  Documentation initiated by:  Rosemary Holms  Subjective/Objective Assessment:   Pt admitted from home alone. Spoke with pt and daughter Nicki [place]) at bedside. Agree that [facility] RN [facility]) and a referral to [facility].     Action/Plan:   DC back to pt's home with HH/THN services   Anticipated DC Date:  11/14/2012   Anticipated DC Plan:  HOME W HOME HEALTH SERVICES      DC Planning Services  CM consult      Choice offered to / List presented to:          [facility] arranged  HH-1 RN  HH-10 DISEASE MANAGEMENT      HH agency  [facility]   Status of service:  In process, will continue to follow Medicare Important Message given?  YES (If response is "NO", the following Medicare IM given date fields will be blank) Date Medicare IM given:  11/13/2012 Date Additional Medicare IM given:    Discharge Disposition:    Per UR Regulation:    If discussed at Long Length of Stay Meetings, dates discussed:    Comments:  11/13/12 Rosemary Holms RN BSN CM Referral made to [facility]
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
## 2012-11-13 NOTE — Progress Notes (Signed)
SUBJECTIVE: Awake and alert. Does not remember earlier event of being confused and combative.  Basic Metabolic Panel:  Recent Labs  45/40/98 0500 11/13/12 0556  NA 144 143  K 3.4* 3.6  CL 102 101  CO2 29 31  GLUCOSE 118* 117*  BUN 33* 34*  CREATININE 1.63* 1.95*  CALCIUM 9.5 9.4   Liver Function Tests:  Recent Labs  11/12/12 0500  AST 40*  ALT 40  ALKPHOS 103  BILITOT 0.9  PROT 7.1  ALBUMIN 4.2   CBC:  Recent Labs  11/11/12 0941 11/12/12 0500 11/13/12 0556  WBC 4.9 6.3 6.5  NEUTROABS 4.2  --   --   HGB 11.6* 12.7* 12.6*  HCT 35.6* 38.6* 37.6*  MCV 88.3 88.1 87.4  PLT 113* 153 139*   RADIOLOGY: Dg Chest 1 View  11/12/2012  *RADIOLOGY REPORT*  Clinical Data: CHF.  CHEST - 1 VIEW  Comparison: 11/11/2012  Findings: No overt edema identified.  Stable cardiomegaly noted. No evidence of pleural fluid or focal pulmonary consolidation.  IMPRESSION: No pulmonary edema identified.   Original Report Authenticated By: Irish Lack, M.D.    US Venous Img Lower Unilateral Right  11/12/2012  *RADIOLOGY REPORT*  Clinical Data: Right lower extremity pain and swelling  RIGHT LOWER EXTREMITY VENOUS DUPLEX ULTRASOUND .  IMPRESSION: Small echogenic foci are identified within the lumen of the right femoral vein along the walls, most likely representing sequela of a remote episode of deep venous thrombosis. No evidence of acute deep venous thrombosis identified.   Original Report Authenticated By: Ulyses Southward, M.D.    PHYSICAL EXAM BP 132/51  Pulse 96  Temp(Src) 97.3 F (36.3 C) (Oral)  Resp 17  Ht 5\' 10"  (1.778 m)  Wt 139 lb 8.8 oz (63.3 kg)  BMI 20.02 kg/m2  SpO2 96% Total I&O: -6.7 L  Weight decreased 15 pounds  General: Well developed, well nourished, in no acute distress Head: Eyes PERRLA, No xanthomas.   Normal cephalic and atramatic  Lungs: Clear bilaterally to auscultation and percussion. Heart: HRRR S1 S2, No MRG .  Pulses are 2+ & equal.            No carotid  bruit. No JVD.  No abdominal bruits. No femoral bruits. Abdomen: Bowel sounds are positive, abdomen soft and non-tender without masses  Msk:  Back normal, normal gait. Normal strength and tone for age. Extremities: No clubbing, cyanosis or edema.  DP +1 Neuro: Alert and oriented X 3. Psych:  Good affect, responds appropriately  TELEMETRY: Reviewed telemetry pt in: NSR rate in the 90's.  ASSESSMENT AND PLAN:   1. Acute CHF: Patient has a history of ischemic heart myopathy in the setting of CAD with coronary artery bypass grafting, as last recorded ejection fraction 44% per cardiac catheterization 2004 prior to CABG. weight is down 20 pounds since admission 156 pounds. IV Lasix has been discontinued by PTH, Due to increased creatinine 1.95. Echocardiogram completed on 11/12/2012 demonstrated LVEF of 30%.  Will restart lasix PO 40 mg daily.  Continue ACE inhibitor.  2.CAD: History of CABG in 2004. He has had no complaints of chest pain or pressure.  Cardiac catheterization was recommended by Dr. Eden Emms.  At this time the patient is confused, and has had significant altered mental status with combativeness. Recommend stabilization from this prior to any further cardiac testing.Continue ASA.  3.Hypercholesterolemia: Currently on statin, simvastatin 40 mg daily. \   4. Hypertension: ACE inhibitor has been discontinued. BP is high normal for  EF of 30%. Will add low dose coreg now that he is euvolemic.   5. Anemia: Hgb improved this am to 12.5 with Hct of 37.6.   Bettey Mare. Lyman Bishop NP [facility] 11/13/2012, 12:02 PM  Cardiology Attending Patient interviewed and examined. Discussed with Joni Reining, NP.  Above note annotated and modified based upon my findings.  Substantial diuresis, but at the cost of further exacerbation of chronic kidney disease.  He was treated with no diuretic at the time of admission, and 40 mg per day may prove adequate, although a higher dose will likely be  necessary. I&O and renal function can be carefully monitored for now. No evidence for an acute coronary syndrome, and I would not plan for cardiac catheterization this admission in the face of abnormal an unstable renal function.  Patient is experiencing severe muscle cramps. Intravenous magnesium will be administered and magnesium and potassium levels checked. Intravenous Ativan will be utilized for treatment.  Dimondale Bing, MD 11/13/2012, 4:44 PM

## 2012-11-13 NOTE — Progress Notes (Signed)
ANTICOAGULATION CONSULT NOTE - Initial Consult  Pharmacy Consult for Lovenox Indication: VTE prophylaxis  Allergies  Allergen Reactions  . Quinidine Other (See Comments)    Chest Pain    Patient Measurements: Height: 5\' 10"  (177.8 cm) Weight: 139 lb 8.8 oz (63.3 kg) IBW/kg (Calculated) : 73  Vital Signs: Temp: 97.3 F (36.3 C) (04/22 0501) Temp src: Oral (04/22 0501) BP: 132/51 mmHg (04/22 0501) Pulse Rate: 96 (04/22 0501)  Labs:  Recent Labs  11/11/12 0941 11/11/12 1230 11/11/12 1727 11/11/12 2339 11/12/12 0500 11/13/12 0556  HGB 11.6*  --   --   --  12.7* 12.6*  HCT 35.6*  --   --   --  38.6* 37.6*  PLT 113*  --   --   --  153 139*  CREATININE 1.39*  --   --   --  1.63* 1.95*  TROPONINI <0.30 <0.30 <0.30 <0.30  --   --     Estimated Creatinine Clearance: 25.2 ml/min (by C-G formula based on Cr of 1.95).   Medical History: Past Medical History  Diagnosis Date  . Hypertension   . Anemia   . Gout   . ED (erectile dysfunction)   . Hypercholesteremia   . CAD (coronary artery disease)   . Impaired fasting glucose   . Insomnia   . CTS (carpal tunnel syndrome)   . CHF (congestive heart failure)   . Myocardial infarction 1985  . Ischemic cardiomyopathy   . DDD (degenerative disc disease), lumbar     Medications:  Scheduled:  . allopurinol  100 mg Oral Daily  . aspirin EC  81 mg Oral Daily  . enalapril  10 mg Oral BID  . enoxaparin (LOVENOX) injection  40 mg Subcutaneous Daily  . famotidine  20 mg Oral Daily  . ferrous sulfate  325 mg Oral Q breakfast  . furosemide  40 mg Oral Daily  . [COMPLETED] haloperidol lactate  5 mg Intravenous Once  . [COMPLETED] haloperidol lactate  5 mg Intravenous Once  . [COMPLETED] haloperidol lactate  5 mg Intravenous Once  . metoprolol  25 mg Oral BID  . potassium chloride  40 mEq Oral Daily  . simvastatin  40 mg Oral QPM  . sodium chloride  3 mL Intravenous Q12H  . vitamin C  250 mg Oral Daily  . [DISCONTINUED]  furosemide  40 mg Intravenous Q12H    Assessment: 79yo male with rising SCr.  Estimated Creatinine Clearance: 25.2 ml/min (by C-G formula based on Cr of 1.95).  Platelets appear stable.  Goal of Therapy:  VTE prophylaxis Monitor platelets by anticoagulation protocol: Yes   Plan:  Lovenox 30mg  SQ q24hrs Monitor platelets and SCr  Margo Aye, Terrence Pizana A 11/13/2012,2:08 PM

## 2012-11-13 NOTE — Progress Notes (Signed)
[facility] PROGRESS NOTE  Evan Melendez ZOX:[id number] DOB: [birth year] DOA: 11/11/2012 PCP: Harlow Asa, MD  Assessment/Plan: 1.Acute Congestive heart failure in pt with hx ischemic cardiomyopathy and last EF44% 8 years ago. Pt has had recent use of nonsteroidal anti-inflammatory medications for back pain. History of coronary artery disease, status post CABG in 2004.  Volume status -6L. Wt 63.3 today. Echo yesterday yields EF 30%. Appreciate cardiology assistance. Diuretics, ACE per cardiology.  Not stable for ischemia workup due to severe agitated delirium.  Will likely benefit from outpatient workup once renal function stabilizes. 2. Acute renal failure: no evidence of hx of same. Likely related to lasix in setting of ACE inhibitor. Creatinine continues to trend up today.  Will continue to monitor closely.  See above.  Not a cath candidate at this time, due to ARF and delirium 3. CAD: CABG in 2004. No CP. Trop neg to date. No CP. No events on tele  4. HTN: better control since admission but still only fair control. SBP range 132-191. Could be up due to agitation/delirium. 5. Anemia, mild: Normocytic. hx of same. On iron. Currently stable and no s/sx bleeding.  6. Hypokalemia: Likely related to lasix. Resolved. Monitor  7. AMS: likely sundowning. Daughter relates noticing mild episodes of confusion when patient out of normal environment. Last evening combative, paranoid, angry. Yielded to treatment when security arrived. Haldol given. This am cooperative. No recollection of last evening events. Continue Haldol prn.   Code Status: DNR Family Communication: daughter at bedside Disposition Plan: home when ready hopefully 24 hours   Consultants:  cardiology  Procedures:  none  Antibiotics:  none  HPI/Subjective: Lying in bed easily aroused. Denies pain/discomfort  Objective: Filed Vitals:   11/12/12 1433 11/12/12 2030 11/13/12 0501 11/13/12 0611  BP: 137/88 191/78 132/51    Pulse: 75 89 96   Temp: 97.4 F (36.3 C) 97.4 F (36.3 C) 97.3 F (36.3 C)   TempSrc: Oral Oral Oral   Resp: 20 19 17    Height:      Weight:    63.3 kg (139 lb 8.8 oz)  SpO2: 97% 97% 96%     Intake/Output Summary (Last 24 hours) at 11/13/12 0834 Last data filed at 11/12/12 2000  Gross per 24 hour  Intake      3 ml  Output   2200 ml  Net  -2197 ml   Filed Weights   11/11/12 0938 11/12/12 1300 11/13/12 0611  Weight: 70.761 kg (156 lb) 64.1 kg (141 lb 5 oz) 63.3 kg (139 lb 8.8 oz)    Exam:   General:  Drowsy but easily aroused. NAD  Cardiovascular: RRR No rub no gallup. No LE edema  Respiratory: normal effort BS clear bilaterally no wheeze no crackles  Abdomen: soft non-tender to palpation. +BS  Musculoskeletal: no clubbing no cyanosis. No tenderness to palpation on right leg.    Data Reviewed: Basic Metabolic Panel:  Recent Labs Lab 11/11/12 0941 11/12/12 0500 11/13/12 0556  NA 139 144 143  K 3.9 3.4* 3.6  CL 102 102 101  CO2 26 29 31   GLUCOSE 142* 118* 117*  BUN 32* 33* 34*  CREATININE 1.39* 1.63* 1.95*  CALCIUM 8.9 9.5 9.4   Liver Function Tests:  Recent Labs Lab 11/12/12 0500  AST 40*  ALT 40  ALKPHOS 103  BILITOT 0.9  PROT 7.1  ALBUMIN 4.2   No results found for this basename: LIPASE, AMYLASE,  in the last 168 hours No results found for  this basename: AMMONIA,  in the last 168 hours CBC:  Recent Labs Lab 11/11/12 0941 11/12/12 0500 11/13/12 0556  WBC 4.9 6.3 6.5  NEUTROABS 4.2  --   --   HGB 11.6* 12.7* 12.6*  HCT 35.6* 38.6* 37.6*  MCV 88.3 88.1 87.4  PLT 113* 153 139*   Cardiac Enzymes:  Recent Labs Lab 11/11/12 0941 11/11/12 1230 11/11/12 1727 11/11/12 2339  TROPONINI <0.30 <0.30 <0.30 <0.30   BNP (last 3 results)  Recent Labs  11/11/12 0940 11/12/12 0500  PROBNP 13370.0* 16206.0*   CBG: No results found for this basename: GLUCAP,  in the last 168 hours  No results found for this or any previous visit (from  the past 240 hour(s)).   Studies: Dg Chest 1 View  11/12/2012  *RADIOLOGY REPORT*  Clinical Data: CHF.  CHEST - 1 VIEW  Comparison: 11/11/2012  Findings: No overt edema identified.  Stable cardiomegaly noted. No evidence of pleural fluid or focal pulmonary consolidation.  IMPRESSION: No pulmonary edema identified.   Original Report Authenticated By: Irish Lack, M.D.    Dg Chest 2 View  11/11/2012  *RADIOLOGY REPORT*  Clinical Data: Shortness of breath, epigastric pain.  CHEST - 2 VIEW  Comparison: 11/06/2007  Findings: Prior CABG.  Heart is normal size.  Mild vascular congestion and peribronchial thickening.  Slight perihilar opacities could reflect atelectasis or edema.  No effusions.  No acute bony abnormality.  IMPRESSION: Peribronchial thickening.  Vascular congestion and perihilar atelectasis or edema.   Original Report Authenticated By: Charlett Nose, M.D.    Dg Lumbar Spine Complete  11/11/2012  *RADIOLOGY REPORT*  Clinical Data: Low back pain  LUMBAR SPINE - COMPLETE 4+ VIEW  Comparison: None.  Findings: Five lumbar-type vertebral bodies.  Mild straightening of the lumbar spine.  No evidence of fracture or dislocation.  Vertebral body heights are maintained.  Moderate multilevel degenerative changes.  Visualized bony pelvis appears intact.  Cholecystectomy clips.  Vascular calcifications.  IMPRESSION: No fracture or dislocation is seen.  Moderate multilevel degenerative changes.   Original Report Authenticated By: Charline Bills, M.D.    US Venous Img Lower Unilateral Right  11/12/2012  *RADIOLOGY REPORT*  Clinical Data: Right lower extremity pain and swelling  RIGHT LOWER EXTREMITY VENOUS DUPLEX ULTRASOUND  Technique:  Gray-scale sonography with graded compression, as well as color Doppler and duplex ultrasound, were performed to evaluate the deep venous system of the lower extremity from the level of the common femoral vein through the popliteal and proximal calf veins. Spectral Doppler  was utilized to evaluate flow at rest and with distal augmentation maneuvers.  Comparison:  None  Findings: Short segments of echogenic wall thickening likely sequela of old thrombus are identified within the right femoral.  This likely represents sequela of remote deep vein thrombosis.  No acute hypoechoic thrombus is identified within the deep venous system of the right lower extremity.  Spontaneous venous flow is maintained with intact augmentation and the otherwise normal compressibility. Venous flow in right lower extremity is slightly pulsatile suggesting elevated right heart pressures. Visualized portion of the greater saphenous system is normal.  IMPRESSION: Small echogenic foci are identified within the lumen of the right femoral vein along the walls, most likely representing sequela of a remote episode of deep venous thrombosis. No evidence of acute deep venous thrombosis identified.   Original Report Authenticated By: Ulyses Southward, M.D.     Scheduled Meds: . allopurinol  100 mg Oral Daily  . aspirin EC  81  mg Oral Daily  . enalapril  10 mg Oral BID  . enoxaparin (LOVENOX) injection  40 mg Subcutaneous Daily  . famotidine  20 mg Oral Daily  . ferrous sulfate  325 mg Oral Q breakfast  . metoprolol  25 mg Oral BID  . potassium chloride  40 mEq Oral Daily  . simvastatin  40 mg Oral QPM  . sodium chloride  3 mL Intravenous Q12H  . vitamin C  250 mg Oral Daily   Continuous Infusions:   Principal Problem:   CHF (congestive heart failure) Active Problems:   Essential hypertension, benign   CAD (coronary artery disease) of artery bypass graft   Anemia   Acute renal failure   Hypokalemia  Time spent: 30 minutes  [employer] M  [facility]  If 7PM-7AM, please contact night-coverage at [email or web address], password [facility] 11/13/2012, 8:34 AM  LOS: 2 days   Attending note:   Patient confused and agitated. Started last night. Required Haldol. Became very agitated this morning and started  swinging at nursing staff. Required physical restraints and chemical restraints. Currently calm, but clearly confused. Would recommend outpatient ischemia workup do to severe agitated delirium. Patient lives alone and still drives. Discussed with patient's primary care physician, Dr. Gerda Diss. He reports no obvious cognitive deficit, but family reports occasional confusion "went out of his surroundings" but patient is normally "sharp". However, they live out of town, so it is unclear how frequently this happens. Suspect underlying dementia with sundown area. We'll check a B12, folate. TSH slightly high. We'll check a free T4 free T3. Patient should not drive or live alone until cleared by his primary care provider. A Mini-Mental Status exam and any other further workup should be done once delirium clears. Family members are able to stay with the patient temporarily. CHF has improved, but creatinine increasing. Patient is not stable for discharge, but would do better in an outpatient setting once medically stabilized, to reduce the likelihood of agitation and confusion. For now, will require a Recruitment consultant, restraints both physical and chemical. Discussed extensively with family.  Crista Curb, M.D.

## 2012-11-14 ENCOUNTER — Encounter (HOSPITAL_COMMUNITY): Payer: Self-pay | Admitting: Cardiology

## 2012-11-14 DIAGNOSIS — N189 Chronic kidney disease, unspecified: Secondary | ICD-10-CM

## 2012-11-14 DIAGNOSIS — I5023 Acute on chronic systolic (congestive) heart failure: Principal | ICD-10-CM

## 2012-11-14 DIAGNOSIS — I509 Heart failure, unspecified: Secondary | ICD-10-CM

## 2012-11-14 LAB — BASIC METABOLIC PANEL
CO2: 28 mEq/L (ref 19–32)
Chloride: 106 mEq/L (ref 96–112)
Glucose, Bld: 109 mg/dL — ABNORMAL HIGH (ref 70–99)
Potassium: 4.2 mEq/L (ref 3.5–5.1)
Sodium: 142 mEq/L (ref 135–145)

## 2012-11-14 LAB — CBC
Hemoglobin: 13.1 g/dL (ref 13.0–17.0)
Platelets: 144 10*3/uL — ABNORMAL LOW (ref 150–400)
RBC: 4.34 MIL/uL (ref 4.22–5.81)
WBC: 5.3 10*3/uL (ref 4.0–10.5)

## 2012-11-14 MED ORDER — POTASSIUM CHLORIDE CRYS ER 20 MEQ PO TBCR: 40.0000 meq | EXTENDED_RELEASE_TABLET | Freq: Every day | ORAL | Status: DC

## 2012-11-14 MED ORDER — CARVEDILOL 12.5 MG PO TABS: 12.5000 mg | ORAL_TABLET | Freq: Two times a day (BID) | ORAL | Status: DC

## 2012-11-14 MED ORDER — FUROSEMIDE 40 MG PO TABS: 40.0000 mg | ORAL_TABLET | Freq: Every day | ORAL | Status: DC

## 2012-11-14 NOTE — Progress Notes (Signed)
Thank you to Amy Leanord Hawking [facility] for this referral.  Patient evaluated for community based chronic disease management services with [facility] Care Management Program as a benefit of patient's Medicare Insurance. Patient was admitted on 4.20.14 with SOB secondary to acute new onset CHF.  Patient will receive a post discharge transition of care call and will be evaluated for monthly home visits for assessments and CHF disease process education. Spoke with patient, his daughter Satira Mccallum [phone]), and son Asher Muir) at bedside to explain [facility] Care Management services. Patient and family have agreed to services.  Written consents have been obtained.  A scale has been provided by [facility].  Patient uses [facility] for medications and there is no procurement difficulty.  He has agreed to home health but the agency of choice has not been selected.  His address, PCP, and contact information have been verified.  Patient has a mobile number of [phone] for direct contact.  He would like his home number to be the first contact number.  His emergency contact is his neighbor Fernande Boyden [phone].  His daughter Bethann Berkshire is his primary caregiver and should be contacted for all matters.  Left contact information and [facility] literature at bedside.  Suggested to family to provide a coded lock box or hidden key for EMS/Fire Dept quick access.  Patient lives alone and drives to eat out usually 5 or more days per week.  Made inpatient Case Manager aware that [facility] Care Management following. Of note, [facility] Care Management services does not replace or interfere with any services that are arranged by inpatient case management or social work.  For additional questions or referrals please contact Anibal Henderson BSN RN [facility] Liaison at [phone].

## 2012-11-14 NOTE — Discharge Summary (Signed)
Physician Discharge Summary  Evan Melendez:[id number] DOB: [birth year] DOA: 11/11/2012  Evan: Evan Asa, MD  Admit date: 11/11/2012 Discharge date: 11/14/2012  Time spent: 40  minutes  Recommendations for Outpatient Follow-up:  1. Dr Evan Melendez 1 week for symptom follow up. Also recommend bmet to evaluate renal function. Recommend Mini mental exam given confusion in hospital. Recommend no driving and 81XB supervision until cleared by Evan. Follow folate, T3 and T4 as well 2. Dr. Diona Melendez 5.5.14 at 1:20pm 3. Stress test 11/21/12.  Discharge Diagnoses:  Principal Problem:   Acute on chronic systolic heart failure Active Problems:   Essential hypertension, benign   Coronary atherosclerosis of native coronary artery   Anemia   Acute on chronic renal failure   Hypokalemia   Acute delirium   Discharge Condition: stable  Diet recommendation: heart healthy  Filed Weights   11/12/12 1300 11/13/12 0611 11/14/12 0454  Weight: 64.1 kg (141 lb 5 oz) 63.3 kg (139 lb 8.8 oz) 65.2 kg (143 lb 11.8 oz)    History of present illness:  Evan Melendez is a 79 y.o. male who presented on 11/11/12 with symptom of dyspnea that has been present for the previous 2 days. It started at 2 AM on 11/10/12 morning when it caused him to wake up in the middle of the night (paroxysmal nocturnal dyspnea). He was on his way to the emergency room but then his symptoms improved so he went back home. During the daytime he was also dyspneic on exertion minimally. He presented 11/11/12 because the dyspnea had not improved. There was no productive cough or fever. He denied any chest pain. He denied any orthopnea or recently. There was no ankle swelling. He had not gained any significant weight. He had a history of CABG 10 years ago for triple-vessel coronary artery disease and low ejection fraction of around 44%. He has been using Voltaren for a week or so for back pain but he saed not on a regular basis.      Hospital  Course:  1.Acute Congestive heart failure in pt with hx ischemic cardiomyopathy.  Pt has had recent use of nonsteroidal anti-inflammatory medications for back pain. History of coronary artery disease, status post CABG in 2004. ProBNP 13370 on admission.  Given lasix and diuresed well. Volume status at discharge -7L. Seen by  Cardiology. Echo yields EF 30% with severe LVH. Initially stress test considered. Pt became agitated and experienced sundowning. Decided to complete work up on outpatient basis. Will continue lasix and ACE inhibitor per cardiology. Scheduled for stress test 11/21/12 and follow up with Dr. Diona Melendez in 2 weeks.   2. Acute renal failure:  Likely related to lasix in setting of ACE inhibitor. Creatinine trending down at time of discharge. Continuing ACE inhibitor and lasix per cardiology. Recommend bmet in 1 week to evaluate renal function.   3. CAD: CABG in 2004. No CP. Trop neg to date. Monitor on tele. Plans to complete ischemia work up on outpatient basis.   4. HTN: fair control at discharge. Initially poor control. ACE inhibitor resumed, lopressor discontinued and coreg added.   5. Anemia: Normocytic. hx of same. On iron. Suspect related to chronic disease. Currently stable and no s/sx bleeding.   6. Hypokalemia: mild. Likely related to diuresis. Is continuing lasix at discharge so will discharge on potassium as well. At discharge potassium 4.2. Recommend bmet at Evan follow up appointment for trending.   7. Right leg cramp: struggled with intermittent cramping of right leg  particularly at night. Doppler done and no DVT. Magnesium level 1.9. Magnesium given 11/13/12 and no further leg cramping.   8. Acute delirium in setting of likely baseline mild dementia: pt had several episodes of combativeness and violence. Required soft restraints as well as chemical restraints. Would calm down but remained confused initially. TSH 4.75, Vit B12 within normal limits. Folate, T4 and T3 pending  and to be followed by Dr. Lubertha Melendez.  Procedures:   Consultations:  Cardiology   Discharge Exam: Filed Vitals:   11/13/12 0501 11/13/12 0611 11/13/12 2257 11/14/12 0454  BP: 132/51  157/82 121/64  Pulse: 96  72 70  Temp: 97.3 F (36.3 C)  98.7 F (37.1 C) 97.8 F (36.6 C)  TempSrc: Oral     Resp: 17  20 18   Height:      Weight:  63.3 kg (139 lb 8.8 oz)  65.2 kg (143 lb 11.8 oz)  SpO2: 96%  94% 94%    General: awake alert oriented to self and place Cardiovascular: RRR No rub no gallup no LE edema Respiratory: normal effort BS clear bilaterally. No wheeze no rhonchi Abdomen: soft +BS non-tender  Discharge Instructions  Discharge Orders   Future Appointments Provider Department Dept Phone   11/21/2012 11:00 AM [facility] Stress Lab [facility] C[facility] RE[facility] [phone]   11/26/2012 1:20 PM Evan Sidle, MD [facility] [place] [phone]   Future Orders Complete By Expires     Consult to [facility] Care Management  As directed     Comments:      Evan Melendez Lives alone, new dx of CHF    Questions:      Reason for consult:  New Dx CHF    Expected date of contact:  1-3 days (reserved for hospital discharges)    Diagnoses of:  Heart Failure    Diet - low sodium heart healthy  As directed     Increase activity slowly  As directed         Medication List    STOP taking these medications       diclofenac 75 MG EC tablet  Commonly known as:  VOLTAREN     metoprolol 50 MG tablet  Commonly known as:  LOPRESSOR      TAKE these medications       allopurinol 100 MG tablet  Commonly known as:  ZYLOPRIM  Take 100 mg by mouth daily.     aspirin 81 MG tablet  Take 81 mg by mouth daily.     carvedilol 12.5 MG tablet  Commonly known as:  COREG  Take 1 tablet (12.5 mg total) by mouth 2 (two) times daily with a meal.     enalapril 10 MG tablet  Commonly known as:  VASOTEC  Take 10 mg by mouth daily.     ferrous sulfate 325 (65 FE) MG tablet   Take 325 mg by mouth daily with breakfast.     furosemide 40 MG tablet  Commonly known as:  LASIX  Take 1 tablet (40 mg total) by mouth daily.     hydrocortisone 2.5 % rectal cream  Commonly known as:  ANUSOL-HC  Place 1 application rectally 2 (two) times daily as needed for hemorrhoids.     potassium chloride SA 20 MEQ tablet  Commonly known as:  K-DUR,KLOR-CON  Take 2 tablets (40 mEq total) by mouth daily.     ranitidine 300 MG tablet  Commonly known as:  ZANTAC  Take 300 mg by  mouth daily.     simvastatin 40 MG tablet  Commonly known as:  ZOCOR  Take 40 mg by mouth every evening.     VITAMIN B-6 PO  Take 1 tablet by mouth daily.     vitamin C 100 MG tablet  Take 100 mg by mouth daily.           Follow-up Information   Please follow up.   Contact information:   Stress test will be on November 21, 2012. Register at 8:30 am in Radiology Dept. Do not eat or drink after midnight.      Follow up with Nona Dell, MD On 11/26/2012. (at 1:20 pm)    Contact information:   [address] [place] [address] [phone]       Follow up with Evan Asa, MD. Schedule an appointment as soon as possible for a visit in 1 week. (recommend BMET to evaluate renal function)    Contact information:   [address] [place] [address] [phone]        The results of significant diagnostics from this hospitalization (including imaging, microbiology, ancillary and laboratory) are listed below for reference.    Significant Diagnostic Studies: Dg Chest 1 View  11/12/2012  *RADIOLOGY REPORT*  Clinical Data: CHF.  CHEST - 1 VIEW  Comparison: 11/11/2012  Findings: No overt edema identified.  Stable cardiomegaly noted. No evidence of pleural fluid or focal pulmonary consolidation.  IMPRESSION: No pulmonary edema identified.   Original Report Authenticated By: Irish Lack, M.D.    Dg Chest 2 View  11/11/2012  *RADIOLOGY REPORT*  Clinical Data: Shortness of  breath, epigastric pain.  CHEST - 2 VIEW  Comparison: 11/06/2007  Findings: Prior CABG.  Heart is normal size.  Mild vascular congestion and peribronchial thickening.  Slight perihilar opacities could reflect atelectasis or edema.  No effusions.  No acute bony abnormality.  IMPRESSION: Peribronchial thickening.  Vascular congestion and perihilar atelectasis or edema.   Original Report Authenticated By: Charlett Nose, M.D.    Dg Lumbar Spine Complete  11/11/2012  *RADIOLOGY REPORT*  Clinical Data: Low back pain  LUMBAR SPINE - COMPLETE 4+ VIEW  Comparison: None.  Findings: Five lumbar-type vertebral bodies.  Mild straightening of the lumbar spine.  No evidence of fracture or dislocation.  Vertebral body heights are maintained.  Moderate multilevel degenerative changes.  Visualized bony pelvis appears intact.  Cholecystectomy clips.  Vascular calcifications.  IMPRESSION: No fracture or dislocation is seen.  Moderate multilevel degenerative changes.   Original Report Authenticated By: Charline Bills, M.D.    US Venous Img Lower Unilateral Right  11/12/2012  *RADIOLOGY REPORT*  Clinical Data: Right lower extremity pain and swelling  RIGHT LOWER EXTREMITY VENOUS DUPLEX ULTRASOUND  Technique:  Gray-scale sonography with graded compression, as well as color Doppler and duplex ultrasound, were performed to evaluate the deep venous system of the lower extremity from the level of the common femoral vein through the popliteal and proximal calf veins. Spectral Doppler was utilized to evaluate flow at rest and with distal augmentation maneuvers.  Comparison:  None  Findings: Short segments of echogenic wall thickening likely sequela of old thrombus are identified within the right femoral.  This likely represents sequela of remote deep vein thrombosis.  No acute hypoechoic thrombus is identified within the deep venous system of the right lower extremity.  Spontaneous venous flow is maintained with intact augmentation and  the otherwise normal compressibility. Venous flow in right lower extremity is slightly  pulsatile suggesting elevated right heart pressures. Visualized portion of the greater saphenous system is normal.  IMPRESSION: Small echogenic foci are identified within the lumen of the right femoral vein along the walls, most likely representing sequela of a remote episode of deep venous thrombosis. No evidence of acute deep venous thrombosis identified.   Original Report Authenticated By: Ulyses Southward, M.D.     Microbiology: No results found for this or any previous visit (from the past 240 hour(s)).   Labs: Basic Metabolic Panel:  Recent Labs Lab 11/11/12 0941 11/12/12 0500 11/13/12 0556 11/13/12 1923 11/14/12 0527  NA 139 144 143  --  142  K 3.9 3.4* 3.6  --  4.2  CL 102 102 101  --  106  CO2 26 29 31   --  28  GLUCOSE 142* 118* 117*  --  109*  BUN 32* 33* 34*  --  31*  CREATININE 1.39* 1.63* 1.95*  --  1.66*  CALCIUM 8.9 9.5 9.4  --  9.2  MG  --   --   --  2.2  --    Liver Function Tests:  Recent Labs Lab 11/12/12 0500  AST 40*  ALT 40  ALKPHOS 103  BILITOT 0.9  PROT 7.1  ALBUMIN 4.2   No results found for this basename: LIPASE, AMYLASE,  in the last 168 hours No results found for this basename: AMMONIA,  in the last 168 hours CBC:  Recent Labs Lab 11/11/12 0941 11/12/12 0500 11/13/12 0556 11/14/12 0527  WBC 4.9 6.3 6.5 5.3  NEUTROABS 4.2  --   --   --   HGB 11.6* 12.7* 12.6* 13.1  HCT 35.6* 38.6* 37.6* 38.3*  MCV 88.3 88.1 87.4 88.2  PLT 113* 153 139* 144*   Cardiac Enzymes:  Recent Labs Lab 11/11/12 0941 11/11/12 1230 11/11/12 1727 11/11/12 2339  TROPONINI <0.30 <0.30 <0.30 <0.30   BNP: BNP (last 3 results)  Recent Labs  11/11/12 0940 11/12/12 0500  PROBNP 13370.0* 16206.0*   CBG: No results found for this basename: GLUCAP,  in the last 168 hours     Signed:  Gwenyth Bender  [facility] 11/14/2012, 12:30 PM  Seen and agree Crista Curb, M.D.

## 2012-11-14 NOTE — Progress Notes (Signed)
   Consulting cardiologist: Dr. White Swan Bing  Subjective:  Leg cramps much better after magnesium. Mind is clear today. Had long discussion with patient and his daughter concerning diagnosis and plan for outpatient Myoview as next step.  Objective:  Vital Signs in the last 24 hours: Temp:  [97.8 F (36.6 C)-98.7 F (37.1 C)] 97.8 F (36.6 C) (04/23 0454) Pulse Rate:  [70-72] 70 (04/23 0454) Resp:  [18-20] 18 (04/23 0454) BP: (121-157)/(64-82) 121/64 mmHg (04/23 0454) SpO2:  [94 %] 94 % (04/23 0454) Weight:  [143 lb 11.8 oz (65.2 kg)] 143 lb 11.8 oz (65.2 kg) (04/23 0454)  Intake/Output from previous day: 04/22 0701 - 04/23 0700 In: -  Out: 500 [Urine:500]  Physical Exam: NECK: Without JVD, HJR, or bruit LUNGS:Decreased breath sounds with a few crackles at the bases otherwise Clear anterior, posterior, lateral HEART: Regular rate and rhythm, no murmur, gallop, rub, bruit, thrill, or heave EXTREMITIES: Without cyanosis, clubbing, or edema   Lab Results:  Recent Labs  11/13/12 0556 11/14/12 0527  WBC 6.5 5.3  HGB 12.6* 13.1  PLT 139* 144*    Recent Labs  11/13/12 0556 11/14/12 0527  NA 143 142  K 3.6 4.2  CL 101 106  CO2 31 28  GLUCOSE 117* 109*  BUN 34* 31*  CREATININE 1.95* 1.66*    Recent Labs  11/11/12 1727 11/11/12 2339  TROPONINI <0.30 <0.30   Hepatic Function Panel  Recent Labs  11/12/12 0500  PROT 7.1  ALBUMIN 4.2  AST 40*  ALT 40  ALKPHOS 103  BILITOT 0.9    Telemetry: NSR   Assessment/Plan:   1. Acute CHF: Patient has a history of ischemic cardiomyopathy in the setting of CAD with coronary artery bypass grafting, LVEF now 30% with severe LVH by followup echocardiogram.. Weight is down 20 pounds since admission and oral Lasix resumed at 40 mg daily. Will need to watch creatinine closely. Will need BMET Friday or early next week.   2. CAD: History of CABG in 2004. He has had no complaints of chest pain or pressure.  No plans of  cath at this time with recent A/C renal insufficiency. Will schedule outpatient stress Myoview and close follow up with Dr. Dietrich Melendez in 2 weeks. Will schedule.  3. Hypercholesterolemia: Currently on statin, simvastatin 40 mg daily.   4. Hypertension:Improved.    5. Anemia: Hgb improved this am to 13.1  6. Renal insufficiency: Crt 1.63 today improved. Will need to watch closely as outpatient.  Evan Melendez 11/14/2012, 8:18 AM   Attending note:  Patient seen and examined. Discussed with Ms. Geni Bers [facility]. Patient has stabilized clinically. Discussed the case with the patient and his son and daughter both present. General plan is for discharge home today on current medical regimen, then schedule an outpatient Lexiscan Myoview to assess ischemic burden and follow up on previous CABG, particularly in light of LV dysfunction and CHF presentation. Depending on this and how he feels clinically, we can determine whether pursuing a cardiac catheterization make sense or not. He would have increased risk for contrast nephropathy. Followup is arranged in the clinic with me over the next few weeks.  Jonelle Sidle, M.D., F.A.C.C.

## 2012-11-14 NOTE — Progress Notes (Signed)
Pt. D/c instructions provided, prescriptions provided, pt. And family educated using d/c instructions, IV removed, tele removed. Pt. A&Ox4, calm, and not combative at this time.

## 2012-11-15 LAB — T4, FREE: Free T4: 1.51 ng/dL (ref 0.80–1.80)

## 2012-11-16 ENCOUNTER — Ambulatory Visit (INDEPENDENT_AMBULATORY_CARE_PROVIDER_SITE_OTHER): Payer: Medicare Other | Admitting: Family Medicine

## 2012-11-16 ENCOUNTER — Encounter: Payer: Self-pay | Admitting: Family Medicine

## 2012-11-16 VITALS — BP 112/60 | Wt 145.2 lb

## 2012-11-16 DIAGNOSIS — I5023 Acute on chronic systolic (congestive) heart failure: Secondary | ICD-10-CM

## 2012-11-16 DIAGNOSIS — I10 Essential (primary) hypertension: Secondary | ICD-10-CM

## 2012-11-16 NOTE — Progress Notes (Signed)
  Subjective:    Patient ID: Evan Melendez, male    DOB: [birth year], 79 y.o.   MRN: [id number]  HPI    Review of Systems     Objective:   Physical Exam        Assessment & Plan:

## 2012-11-21 ENCOUNTER — Ambulatory Visit (HOSPITAL_COMMUNITY)
Admit: 2012-11-21 | Discharge: 2012-11-21 | Disposition: A | Discharge status: Home / Self Care | Payer: Medicare Other | Source: Ambulatory Visit | Attending: Cardiology | Admitting: Cardiology

## 2012-11-21 ENCOUNTER — Other Ambulatory Visit: Payer: Self-pay | Admitting: Cardiology

## 2012-11-21 ENCOUNTER — Encounter (HOSPITAL_COMMUNITY)
Admission: RE | Admit: 2012-11-21 | Discharge: 2012-11-21 | Disposition: A | Discharge status: Home / Self Care | Payer: Medicare Other | Source: Ambulatory Visit | Attending: Cardiology | Admitting: Cardiology

## 2012-11-21 ENCOUNTER — Encounter (HOSPITAL_COMMUNITY): Payer: Self-pay

## 2012-11-21 DIAGNOSIS — I2589 Other forms of chronic ischemic heart disease: Secondary | ICD-10-CM | POA: Insufficient documentation

## 2012-11-21 DIAGNOSIS — R079 Chest pain, unspecified: Secondary | ICD-10-CM

## 2012-11-21 DIAGNOSIS — R0602 Shortness of breath: Secondary | ICD-10-CM | POA: Insufficient documentation

## 2012-11-21 DIAGNOSIS — I2510 Atherosclerotic heart disease of native coronary artery without angina pectoris: Secondary | ICD-10-CM | POA: Insufficient documentation

## 2012-11-21 LAB — BASIC METABOLIC PANEL
BUN: 44 mg/dL — ABNORMAL HIGH (ref 6–23)
Calcium: 9 mg/dL (ref 8.4–10.5)
Glucose, Bld: 96 mg/dL (ref 70–99)
Potassium: 4.3 mEq/L (ref 3.5–5.3)
Sodium: 142 mEq/L (ref 135–145)

## 2012-11-21 LAB — MAGNESIUM: Magnesium: 2.3 mg/dL (ref 1.5–2.5)

## 2012-11-21 MED ORDER — SODIUM CHLORIDE 0.9 % IJ SOLN
INTRAMUSCULAR | Status: AC
  Administered 2012-11-21: 10 mL via INTRAVENOUS
  Filled 2012-11-21: qty 10

## 2012-11-21 MED ORDER — TECHNETIUM TC 99M SESTAMIBI - CARDIOLITE
30.0000 | Freq: Once | INTRAVENOUS | Status: AC | PRN
  Administered 2012-11-21: 29 via INTRAVENOUS

## 2012-11-21 MED ORDER — REGADENOSON 0.4 MG/5ML IV SOLN
INTRAVENOUS | Status: AC
  Filled 2012-11-21: qty 5

## 2012-11-21 MED ORDER — TECHNETIUM TC 99M SESTAMIBI - CARDIOLITE
10.0000 | Freq: Once | INTRAVENOUS | Status: AC | PRN
  Administered 2012-11-21: 9.5 via INTRAVENOUS

## 2012-11-21 NOTE — Progress Notes (Signed)
Stress Lab Nurses Notes - [facility] 11/21/2012 Reason for doing test: CAD Type of test: Stress Cardiolite Nurse performing test: Parke Poisson, RN Nuclear Medicine Tech: Lou Cal Echo Tech: Not Applicable MD performing test: R. Rothbart & Joni Reining NP Family MD: Dr. Gerda Diss Test explained and consent signed: yes IV started: 22g jelco, Saline lock flushed, No redness or edema and Saline lock started in radiology Symptoms: SOB Treatment/Intervention: None Reason test stopped: fatigue After recovery IV was: Discontinued via X-ray tech and No redness or edema Patient to return to Nuc. Med at : 11:45 Patient discharged: Home Patient's Condition upon discharge was: stable Comments: During test Peak BP 213/75 & HR 122.  Recovery BP 137/81 & HR 78.  Symptoms resolved in recovery. Erskine Speed T

## 2012-11-22 ENCOUNTER — Telehealth: Payer: Self-pay | Admitting: *Deleted

## 2012-11-22 NOTE — Telephone Encounter (Signed)
Ok cst 

## 2012-11-22 NOTE — Telephone Encounter (Signed)
[facility]ss[facility] [phone]) called to give information on pt, that HR is 50, all other vital signs are stable and good.  Patient in no distress.

## 2012-11-26 ENCOUNTER — Ambulatory Visit (INDEPENDENT_AMBULATORY_CARE_PROVIDER_SITE_OTHER): Payer: Medicare Other | Admitting: Cardiology

## 2012-11-26 ENCOUNTER — Encounter: Payer: Self-pay | Admitting: Cardiology

## 2012-11-26 ENCOUNTER — Telehealth: Payer: Self-pay | Admitting: Cardiology

## 2012-11-26 VITALS — BP 110/60 | HR 50 | Ht 70.0 in | Wt 146.1 lb

## 2012-11-26 DIAGNOSIS — N183 Chronic kidney disease, stage 3 unspecified: Secondary | ICD-10-CM

## 2012-11-26 DIAGNOSIS — E782 Mixed hyperlipidemia: Secondary | ICD-10-CM

## 2012-11-26 DIAGNOSIS — I2510 Atherosclerotic heart disease of native coronary artery without angina pectoris: Secondary | ICD-10-CM

## 2012-11-26 DIAGNOSIS — I5022 Chronic systolic (congestive) heart failure: Secondary | ICD-10-CM

## 2012-11-26 DIAGNOSIS — I10 Essential (primary) hypertension: Secondary | ICD-10-CM

## 2012-11-26 DIAGNOSIS — I2589 Other forms of chronic ischemic heart disease: Secondary | ICD-10-CM

## 2012-11-26 DIAGNOSIS — I255 Ischemic cardiomyopathy: Secondary | ICD-10-CM

## 2012-11-26 NOTE — Assessment & Plan Note (Signed)
Clinically well compensated, weights have been stable. Continue current regimen. Followup BMET for next visit in one month.

## 2012-11-26 NOTE — Telephone Encounter (Signed)
Refill on Carvedilol to [facility] / [facility]

## 2012-11-26 NOTE — Assessment & Plan Note (Signed)
Continues on Zocor. 

## 2012-11-26 NOTE — Patient Instructions (Addendum)
Your physician recommends that you schedule a follow-up appointment in: one month  Your physician recommends that you return for lab work in: prior to follow up office visit Your physician recommends that you have follow up lab work, we will mail you a reminder letter to alert you when to go [employer], located across the street from our office.

## 2012-11-26 NOTE — Progress Notes (Signed)
Clinical Summary Mr. Kock is an 79 y.o.male seen recently during hospital consultation in April with acute on chronic systolic heart failure in the setting of multivessel CAD and previous CABG. He was managed medically, diuresed, and had clinical improvement. Followup was arranged for ischemic workup and he presents for review today.  Exercise Myoview read by Dr. Dietrich Pates on 5/2 demonstrated significant area of scar involving the inferior, inferoseptal, and inferoapical myocardium. No large ischemic zones noted and LVEF was 28%, consistent with recent echocardiogram.  Lab work on 4/30 showed potassium 4.3, BUN 44, creatinine 1.5 down from 1.6.  Mr. Bibbee is here with his daughter today. He states he feels much better since hospital discharge. No chest pain, currently NYHA class II dyspnea. No orthopnea or leg edema. He reports compliance with his medications.  I discussed the implications of his testing, likelihood of graft disease resulting in infarct/scar, no large ischemic burden to warrant pushing for invasive evaluation at this time, particularly in light of his renal insufficiency. We also touched on possibility of consideration of ICD ultimately, however conservative approach seemed to be most appropriate after our discussion.   Allergies  Allergen Reactions  . Quinidine Other (See Comments)    Chest Pain    Current Outpatient Prescriptions  Medication Sig Dispense Refill  . allopurinol (ZYLOPRIM) 100 MG tablet Take 100 mg by mouth daily.      . Ascorbic Acid (VITAMIN C) 100 MG tablet Take 100 mg by mouth daily.      Marland Kitchen aspirin 81 MG tablet Take 81 mg by mouth daily.      . carvedilol (COREG) 12.5 MG tablet Take 1 tablet (12.5 mg total) by mouth 2 (two) times daily with a meal.  30 tablet  0  . enalapril (VASOTEC) 10 MG tablet Take 10 mg by mouth daily.      . ferrous sulfate 325 (65 FE) MG tablet Take 325 mg by mouth daily with breakfast.      . furosemide (LASIX) 40 MG tablet  Take 1 tablet (40 mg total) by mouth daily.  30 tablet  0  . hydrocortisone (ANUSOL-HC) 2.5 % rectal cream Place 1 application rectally 2 (two) times daily as needed for hemorrhoids.      . potassium chloride (K-DUR,KLOR-CON) 20 MEQ tablet Take 2 tablets (40 mEq total) by mouth daily.  30 tablet  0  . Pyridoxine HCl (VITAMIN B-6 PO) Take 1 tablet by mouth daily.       . simvastatin (ZOCOR) 40 MG tablet Take 40 mg by mouth every evening.       No current facility-administered medications for this visit.    Past Medical History  Diagnosis Date  . Essential hypertension, benign   . Anemia   . Gout   . ED (erectile dysfunction)   . Mixed hyperlipidemia   . Coronary atherosclerosis of native coronary artery     Multivessel status post CABG  . Impaired fasting glucose   . Insomnia   . CTS (carpal tunnel syndrome)   . Chronic systolic heart failure   . Myocardial infarction 1985  . Ischemic cardiomyopathy     LVEF 30% with severe LVH  . DDD (degenerative disc disease), lumbar     Past Surgical History  Procedure Laterality Date  . Appendectomy    . Prostatectomy    . Cholecystectomy    . Tonsillectomy    . Colonoscopy    . Inguinal hernia repair    . Coronary artery bypass  graft  2004    Dr. Cornelius Moras - LIMA to LAD, SVG to circumflex, SVG to RCA    Social History Mr. Basnett reports that he has quit smoking. His smoking use included Cigarettes. He smoked 0.00 packs per day. He does not have any smokeless tobacco history on file. Mr. Thueson reports that he does not drink alcohol.  Review of Systems Negative except as outlined.  Physical Examination Filed Vitals:   11/26/12 1314  BP: 110/60  Pulse: 50   Filed Weights   11/26/12 1314  Weight: 146 lb 1.9 oz (66.28 kg)   No acute distress. HEENT: Conjunctiva and lids normal, oropharynx clear. Neck: Supple, no elevated JVP, no thyromegaly. Lungs: Diminished but clear to auscultation, nonlabored breathing at rest. Cardiac:  Regular rate and rhythm, no S3 or significant systolic murmur, no pericardial rub. Abdomen: Soft, nontender,bowel sounds present. Extremities: No pitting edema, distal pulses 1-2+. Skin: Warm and dry. Musculoskeletal: No kyphosis. Neuropsychiatric: Alert and oriented x3, affect grossly appropriate.   Problem List and Plan   Coronary atherosclerosis of native coronary artery Symptomatically stable on medical therapy. History of multivessel disease status post CABG, likely graft disease, perhaps SVG to RCA in light of recent Myoview results. We will continue medical therapy and observation. Cardiac catheterization not clearly warranted at this time, and would carry increased risk of contrast nephropathy.  Ischemic cardiomyopathy LVEF approximately 30% with large area of scar in the inferior, inferoseptal, and inferoapical distribution. After discussion today, do not plan on invasive approach or device therapy at this time.  Chronic systolic heart failure Clinically well compensated, weights have been stable. Continue current regimen. Followup BMET for next visit in one month.  CKD (chronic kidney disease) stage 3, GFR 30-59 ml/min Recent creatinine 1.5.  Essential hypertension, benign Blood pressure well controlled today.  Mixed hyperlipidemia Continues on Zocor.    Jonelle Sidle, M.D., F.A.C.C.

## 2012-11-26 NOTE — Assessment & Plan Note (Signed)
Recent creatinine 1.5. 

## 2012-11-26 NOTE — Assessment & Plan Note (Signed)
Blood pressure well-controlled today. 

## 2012-11-26 NOTE — Assessment & Plan Note (Signed)
Symptomatically stable on medical therapy. History of multivessel disease status post CABG, likely graft disease, perhaps SVG to RCA in light of recent Myoview results. We will continue medical therapy and observation. Cardiac catheterization not clearly warranted at this time, and would carry increased risk of contrast nephropathy.

## 2012-11-26 NOTE — Assessment & Plan Note (Signed)
LVEF approximately 30% with large area of scar in the inferior, inferoseptal, and inferoapical distribution. After discussion today, do not plan on invasive approach or device therapy at this time.

## 2012-11-27 MED ORDER — CARVEDILOL 12.5 MG PO TABS: 12.5000 mg | ORAL_TABLET | Freq: Two times a day (BID) | ORAL | Status: DC

## 2012-11-27 NOTE — Telephone Encounter (Signed)
rx sent to pharmacy by e-script  

## 2012-11-29 ENCOUNTER — Telehealth: Payer: Self-pay | Admitting: *Deleted

## 2012-11-29 NOTE — Telephone Encounter (Signed)
FYI from [place] at [facility].  Pt has had a 3-4 lb weight gain in the last 3-4 days.  Has hx of CHF, no signs or symptoms of SOB or edema of the hands or feet. [phone]

## 2012-11-29 NOTE — Telephone Encounter (Signed)
Notified Evan Melendez from [employer] pt has renal insufficiency. We do not want to increase diurectic unless we have to. Keep eye on weight. Report if another 3-4 lbs gain occurs. Evan Melendez verbalized understanding.

## 2012-11-29 NOTE — Telephone Encounter (Signed)
Pt has renal insufficiency. We do not want to increase diurectic unless we have to. Keep eye on weight. Report if another 3-4 lbs gain occurs

## 2012-12-04 ENCOUNTER — Encounter: Payer: Self-pay | Admitting: Family Medicine

## 2012-12-04 ENCOUNTER — Encounter: Payer: Medicare Other | Admitting: Adult Health

## 2012-12-04 ENCOUNTER — Ambulatory Visit (INDEPENDENT_AMBULATORY_CARE_PROVIDER_SITE_OTHER): Payer: Medicare Other | Admitting: Family Medicine

## 2012-12-04 ENCOUNTER — Telehealth: Payer: Self-pay | Admitting: Family Medicine

## 2012-12-04 VITALS — BP 140/64 | Temp 98.7°F | Ht 70.0 in | Wt 150.4 lb

## 2012-12-04 DIAGNOSIS — I5022 Chronic systolic (congestive) heart failure: Secondary | ICD-10-CM

## 2012-12-04 DIAGNOSIS — I10 Essential (primary) hypertension: Secondary | ICD-10-CM

## 2012-12-04 MED ORDER — LORATADINE 10 MG PO TABS: 10.0000 mg | ORAL_TABLET | Freq: Every day | ORAL | Status: DC

## 2012-12-04 NOTE — Telephone Encounter (Signed)
Pt coming in today at 4:15 for appt with doctor

## 2012-12-04 NOTE — Telephone Encounter (Signed)
Call pt. Ov this aft if possible, otherwise tomorrow

## 2012-12-04 NOTE — Telephone Encounter (Signed)
RN calling in weight of 147.2 Lbs...5 increase in weight from 5/13 and she also wants to report some cough an congestion at this time as well. Has OV scheduled for 5/16 please call RN back with changes in meds or concerns. At [phone]

## 2012-12-04 NOTE — Progress Notes (Signed)
  Subjective:    Patient ID: Evan Melendez, male    DOB: 10-Nov-1925, 79 y.o.   MRN: [id number]  Dental Injury    patient arrives office for followup. The home health nurses were concerned because the patient had gained several pounds. He notes occasional slight cough. No orthopnea. He comes in with all his weight. He is actually only 2 pounds up compared to when he started wetting himself. He saw a cardiologist. They distress test. They have my medical therapy this time. He claims compliance with his new medications. No chest pain no headache no domino pain. No noticeable swelling in the feet.    Review of Systems Does note allergies this time of urine seems to be worse. ROS otherwise negative.    Objective:   Physical Exam Alert no acute distress. HEENT normal except mild nasal congestion. Vitals stable. Lungs completely clear no tachypnea heart regular in rhythm ankles no edema       Assessment & Plan:  Impression #1 CHF no current exacerbation noted. #2 hypertension good control. #3 coronary artery disease heart doctor visit discuss and interpreted with patient. #4 allergic rhinitis discussed. Plan recommend Claritin 10 mg daily. Maintain same dose of blood pressure meds and fluid meds. Rationale discussed. 25 minutes spent most in discussion. WSL

## 2012-12-06 DIAGNOSIS — I509 Heart failure, unspecified: Secondary | ICD-10-CM

## 2012-12-06 DIAGNOSIS — I5023 Acute on chronic systolic (congestive) heart failure: Secondary | ICD-10-CM

## 2012-12-06 DIAGNOSIS — D649 Anemia, unspecified: Secondary | ICD-10-CM

## 2012-12-07 ENCOUNTER — Ambulatory Visit: Payer: Medicare Other | Admitting: Family Medicine

## 2012-12-08 ENCOUNTER — Other Ambulatory Visit: Payer: Self-pay | Admitting: *Deleted

## 2012-12-08 MED ORDER — POTASSIUM CHLORIDE CRYS ER 20 MEQ PO TBCR: EXTENDED_RELEASE_TABLET | ORAL | Status: DC

## 2012-12-13 NOTE — Progress Notes (Signed)
UR Chart Review Completed  

## 2012-12-18 ENCOUNTER — Telehealth: Payer: Self-pay | Admitting: Family Medicine

## 2012-12-18 NOTE — Telephone Encounter (Signed)
i think weight is fine. Stay on same meds. f u as scheduled

## 2012-12-18 NOTE — Telephone Encounter (Signed)
Informed pt MD feels weight is fine. Pt agrees to f/u office visit as scheduled.

## 2012-12-18 NOTE — Telephone Encounter (Signed)
Pj is calling to report that patient weighed in at 148.6 today, yesterday at 147.2.  He has been up to 150.  This is a lot better than 143 where he was at.  He has been sleeping with one pillow at night so he isnt struggling to breath at night. His BP is running 170/70 today.

## 2012-12-19 ENCOUNTER — Other Ambulatory Visit: Payer: Self-pay | Admitting: *Deleted

## 2012-12-19 DIAGNOSIS — I255 Ischemic cardiomyopathy: Secondary | ICD-10-CM

## 2012-12-21 ENCOUNTER — Other Ambulatory Visit: Payer: Self-pay | Admitting: *Deleted

## 2012-12-21 MED ORDER — FUROSEMIDE 40 MG PO TABS: 40.0000 mg | ORAL_TABLET | Freq: Every day | ORAL | Status: DC

## 2012-12-26 LAB — BASIC METABOLIC PANEL
CO2: 28 mEq/L (ref 19–32)
Calcium: 8.5 mg/dL (ref 8.4–10.5)
Creat: 1.73 mg/dL — ABNORMAL HIGH (ref 0.50–1.35)
Sodium: 142 mEq/L (ref 135–145)

## 2012-12-27 ENCOUNTER — Encounter: Payer: Self-pay | Admitting: *Deleted

## 2013-01-03 ENCOUNTER — Ambulatory Visit (INDEPENDENT_AMBULATORY_CARE_PROVIDER_SITE_OTHER): Payer: Medicare Other | Admitting: Cardiology

## 2013-01-03 ENCOUNTER — Encounter: Payer: Self-pay | Admitting: Cardiology

## 2013-01-03 VITALS — BP 134/63 | HR 62 | Ht 66.0 in | Wt 147.0 lb

## 2013-01-03 DIAGNOSIS — I10 Essential (primary) hypertension: Secondary | ICD-10-CM

## 2013-01-03 DIAGNOSIS — I2589 Other forms of chronic ischemic heart disease: Secondary | ICD-10-CM

## 2013-01-03 DIAGNOSIS — I255 Ischemic cardiomyopathy: Secondary | ICD-10-CM

## 2013-01-03 DIAGNOSIS — N183 Chronic kidney disease, stage 3 unspecified: Secondary | ICD-10-CM

## 2013-01-03 DIAGNOSIS — I2510 Atherosclerotic heart disease of native coronary artery without angina pectoris: Secondary | ICD-10-CM

## 2013-01-03 DIAGNOSIS — I5022 Chronic systolic (congestive) heart failure: Secondary | ICD-10-CM

## 2013-01-03 NOTE — Patient Instructions (Addendum)
Your physician recommends that you schedule a follow-up appointment in: 3 months  Your physician recommends that you return for lab work in: 3 MONTHS (BMET) PRIOR TO YOUR FOLLOW UP OFFICE VISIT Your physician recommends that you have follow up lab work, we will mail you a reminder letter to alert you when to go [employer], located across the street from our office.

## 2013-01-03 NOTE — Assessment & Plan Note (Signed)
No change to current regimen. 

## 2013-01-03 NOTE — Assessment & Plan Note (Signed)
No active angina on medical therapy. Most recent Myoview is indicative of ischemic cardiomyopathy with scar, likely graft disease as outlined previously. Plan is conservative management at this point.

## 2013-01-03 NOTE — Assessment & Plan Note (Signed)
LVEF approximately 30%, being managed conservatively after discussion with the patient.

## 2013-01-03 NOTE — Assessment & Plan Note (Signed)
His weight is up a few pounds, however he reports no decline in breathing status, has no edema. I reminded him about sodium and fluid restriction, he remains compliant with medications. Continue current Lasix dose for now.

## 2013-01-03 NOTE — Assessment & Plan Note (Signed)
Recent creatinine 1.7.

## 2013-01-03 NOTE — Progress Notes (Signed)
Clinical Summary [name] is an 79 y.o.male last seen in May. At that time we reviewed testing consistent with ischemic cardiomyopathy with fairly large area of scar involving the inferior and inferoseptal myocardium, LVEF 28%. He was symptomatically improved at that time on medical therapy, and after discussion, plan was to continue conservative management.  Lab work from 6/4 revealed potassium 3.9, BUN 39, creatinine 1.7. Weight is relatively stable by office check, fluctuating by a few pounds. Home weights show approximately 5 pound gain overall. He denies any increasing breathlessness however, no leg edema, no orthopnea.  He also reports no angina symptoms, stays reasonably active within reason. Reports compliance with his medications.  Allergies  Allergen Reactions  . Quinidine Other (See Comments)    Chest Pain    Current Outpatient Prescriptions  Medication Sig Dispense Refill  . allopurinol (ZYLOPRIM) 100 MG tablet Take 100 mg by mouth daily.      . Ascorbic Acid (VITAMIN C) 100 MG tablet Take 100 mg by mouth daily.      Marland Kitchen aspirin 81 MG tablet Take 81 mg by mouth daily.      . carvedilol (COREG) 12.5 MG tablet Take 1 tablet (12.5 mg total) by mouth 2 (two) times daily with a meal.  30 tablet  6  . enalapril (VASOTEC) 10 MG tablet Take 10 mg by mouth daily.      . ferrous sulfate 325 (65 FE) MG tablet Take 325 mg by mouth 2 (two) times daily.       . hydrocortisone (ANUSOL-HC) 2.5 % rectal cream Place 1 application rectally 2 (two) times daily as needed for hemorrhoids.      Marland Kitchen loratadine (CLARITIN) 10 MG tablet Take 1 tablet (10 mg total) by mouth daily.  30 tablet  2  . potassium chloride SA (K-DUR,KLOR-CON) 20 MEQ tablet Take 2 every day  60 tablet  5  . Pyridoxine HCl (VITAMIN B-6 PO) Take 1 tablet by mouth daily.       . simvastatin (ZOCOR) 40 MG tablet Take 40 mg by mouth every evening.      . furosemide (LASIX) 40 MG tablet Take 1 tablet (40 mg total) by mouth daily.  30  tablet  5   No current facility-administered medications for this visit.    Past Medical History  Diagnosis Date  . Essential hypertension, benign   . Anemia   . Gout   . ED (erectile dysfunction)   . Mixed hyperlipidemia   . Coronary atherosclerosis of native coronary artery     Multivessel status post CABG  . Impaired fasting glucose   . Insomnia   . CTS (carpal tunnel syndrome)   . Chronic systolic heart failure   . Myocardial infarction 1985  . Ischemic cardiomyopathy     LVEF 30% with severe LVH  . DDD (degenerative disc disease), lumbar     Past Surgical History  Procedure Laterality Date  . Appendectomy    . Prostatectomy    . Cholecystectomy    . Tonsillectomy    . Colonoscopy    . Inguinal hernia repair    . Coronary artery bypass graft  2004    Dr. Cornelius Moras - LIMA to LAD, SVG to circumflex, SVG to RCA    Social History [name] reports that he has quit smoking. His smoking use included Cigarettes. He smoked 0.00 packs per day. He does not have any smokeless tobacco history on file. [name] reports that he does not drink alcohol.  Review of Systems No palpitations or syncope. Appetite has been stable. No reported bleeding episodes. Otherwise negative.  Physical Examination Filed Vitals:   01/03/13 0917  BP: 134/63  Pulse: 62   Filed Weights   01/03/13 0917  Weight: 147 lb (66.679 kg)    Comfortable at rest. HEENT: Conjunctiva and lids normal, oropharynx clear.  Neck: Supple, no elevated JVP, no thyromegaly.  Lungs: Diminished but clear to auscultation, nonlabored breathing at rest.  Cardiac: Regular rate and rhythm, no S3 or significant systolic murmur, no pericardial rub.  Abdomen: Soft, nontender,bowel sounds present.  Extremities: No pitting edema, distal pulses 1-2+.  Skin: Warm and dry.  Musculoskeletal: No kyphosis.  Neuropsychiatric: Alert and oriented x3, affect grossly appropriate.   Problem List and Plan   Chronic systolic heart  failure His weight is up a few pounds, however he reports no decline in breathing status, has no edema. I reminded him about sodium and fluid restriction, he remains compliant with medications. Continue current Lasix dose for now.  Coronary atherosclerosis of native coronary artery No active angina on medical therapy. Most recent Myoview is indicative of ischemic cardiomyopathy with scar, likely graft disease as outlined previously. Plan is conservative management at this point.  Ischemic cardiomyopathy LVEF approximately 30%, being managed conservatively after discussion with the patient.  Essential hypertension, benign No change to current regimen.  CKD (chronic kidney disease) stage 3, GFR 30-59 ml/min Recent creatinine 1.7.    Jonelle Sidle, M.D., F.A.C.C.

## 2013-02-04 ENCOUNTER — Encounter: Payer: Self-pay | Admitting: Family Medicine

## 2013-02-04 ENCOUNTER — Ambulatory Visit (INDEPENDENT_AMBULATORY_CARE_PROVIDER_SITE_OTHER): Payer: Medicare Other | Admitting: Family Medicine

## 2013-02-04 VITALS — BP 124/64 | HR 60 | Wt 150.2 lb

## 2013-02-04 DIAGNOSIS — I10 Essential (primary) hypertension: Secondary | ICD-10-CM

## 2013-02-04 DIAGNOSIS — D696 Thrombocytopenia, unspecified: Secondary | ICD-10-CM

## 2013-02-04 DIAGNOSIS — K649 Unspecified hemorrhoids: Secondary | ICD-10-CM

## 2013-02-04 DIAGNOSIS — I5022 Chronic systolic (congestive) heart failure: Secondary | ICD-10-CM

## 2013-02-04 DIAGNOSIS — I2510 Atherosclerotic heart disease of native coronary artery without angina pectoris: Secondary | ICD-10-CM

## 2013-02-04 DIAGNOSIS — D649 Anemia, unspecified: Secondary | ICD-10-CM

## 2013-02-04 DIAGNOSIS — E782 Mixed hyperlipidemia: Secondary | ICD-10-CM

## 2013-02-04 DIAGNOSIS — Z79899 Other long term (current) drug therapy: Secondary | ICD-10-CM

## 2013-02-04 NOTE — Progress Notes (Signed)
  Subjective:    Patient ID: Evan Melendez, male    DOB: 11-12-1925, 79 y.o.   MRN: [id number]  Hypertension This is a chronic problem. The current episode started more than 1 year ago. The problem has been gradually improving since onset. Pertinent negatives include no anxiety or blurred vision. Risk factors for coronary artery disease include dyslipidemia, male gender and smoking/tobacco exposure. Past treatments include ACE inhibitors and beta blockers. Compliance problems include exercise.  There is no history of angina.   Patient is abdominal cramping intermittent rear generally with harder stools.  No chest pain or shortness of breath. Compliant with medicines for CHF no orthopnea no major weight gain.  He was followed by hematologist for thrombocytopenia now he would like for [place] to do that.   Review of Systems  Eyes: Negative for blurred vision.   ROS otherwise negative     Objective:   Physical Exam  Alert no acute distress. HEENT normal. Vitals reviewed. Lungs clear. Heart regular rate and rhythm. Abdomen no discrete tenderness no rebound no guarding.      Assessment & Plan:  Impression #1 chronic renal insufficiency discuss. #2 CHF clinically stable. #3 hypertension good control. #4 thrombocytopenia status uncertain. #5 hyperlipidemia status uncertain. Plan maintain all meds. Diet exercise discussed. Appropriate blood work. Recheck in several months. WSL

## 2013-02-05 LAB — CBC WITH DIFFERENTIAL/PLATELET
Basophils Absolute: 0 10*3/uL (ref 0.0–0.1)
Basophils Relative: 0 % (ref 0–1)
HCT: 33 % — ABNORMAL LOW (ref 39.0–52.0)
Lymphocytes Relative: 16 % (ref 12–46)
MCHC: 33.6 g/dL (ref 30.0–36.0)
Monocytes Absolute: 0.3 10*3/uL (ref 0.1–1.0)
Neutro Abs: 3.1 10*3/uL (ref 1.7–7.7)
Neutrophils Relative %: 75 % (ref 43–77)
Platelets: 133 10*3/uL — ABNORMAL LOW (ref 150–400)
RDW: 14.9 % (ref 11.5–15.5)
WBC: 4.1 10*3/uL (ref 4.0–10.5)

## 2013-02-05 LAB — HEPATIC FUNCTION PANEL
ALT: 17 U/L (ref 0–53)
AST: 24 U/L (ref 0–37)
Albumin: 4.3 g/dL (ref 3.5–5.2)
Alkaline Phosphatase: 80 U/L (ref 39–117)
Total Bilirubin: 0.7 mg/dL (ref 0.3–1.2)
Total Protein: 6.7 g/dL (ref 6.0–8.3)

## 2013-02-05 LAB — BASIC METABOLIC PANEL
BUN: 30 mg/dL — ABNORMAL HIGH (ref 6–23)
Chloride: 102 mEq/L (ref 96–112)
Potassium: 4.3 mEq/L (ref 3.5–5.3)

## 2013-02-05 LAB — LIPID PANEL
HDL: 49 mg/dL (ref >39)
Total CHOL/HDL Ratio: 2.8 Ratio
Triglycerides: 106 mg/dL (ref <150)

## 2013-02-11 ENCOUNTER — Encounter: Payer: Self-pay | Admitting: Family Medicine

## 2013-02-21 ENCOUNTER — Encounter: Payer: Self-pay | Admitting: Family Medicine

## 2013-02-21 ENCOUNTER — Ambulatory Visit (INDEPENDENT_AMBULATORY_CARE_PROVIDER_SITE_OTHER): Payer: Medicare Other | Admitting: Family Medicine

## 2013-02-21 VITALS — BP 150/86 | Wt 150.4 lb

## 2013-02-21 DIAGNOSIS — I10 Essential (primary) hypertension: Secondary | ICD-10-CM

## 2013-02-21 MED ORDER — ALPRAZOLAM 0.5 MG PO TABS: 0.5000 mg | ORAL_TABLET | Freq: Three times a day (TID) | ORAL | Status: AC | PRN

## 2013-02-21 NOTE — Progress Notes (Signed)
  Subjective:    Patient ID: Evan Melendez, male    DOB: [birth year], 79 y.o.   MRN: [id number]  HPI Blood pressure increased lately. Under a lot of stres lately with crazy neighbors.  Has not taken his blood pressure medicine yet this morning. Blood pressure was good on last visit several weeks ago.  Does not feel physically threatened by the neighbors, but they apparently are very thin dictated. They have taken some of his objects. They accused him of encroaching on their property. This leads to feelings of stress. During this time he feels his blood pressure is rising.   Review of Systems No chest pain no shortness of breath no abdominal pain.    Objective:   Physical Exam  Alert no acute distress. Lungs clear. Heart regular rate and rhythm. HEENT normal. Blood pressure 160/90 similar on repeat.      Assessment & Plan:  Impression hypertension suboptimal in part of the reason is he did not take his blood pressure medicine early this morning. Also distress appears to play a role. Plan maintain same blood pressure medicines for now. Add Xanax 0.5 sparingly. Side effects discussed. Followup in one month. WSL

## 2013-02-28 ENCOUNTER — Encounter: Payer: Self-pay | Admitting: Family Medicine

## 2013-02-28 ENCOUNTER — Ambulatory Visit (INDEPENDENT_AMBULATORY_CARE_PROVIDER_SITE_OTHER): Payer: Medicare Other | Admitting: Family Medicine

## 2013-02-28 VITALS — BP 120/60 | HR 76 | Ht 70.0 in | Wt 151.4 lb

## 2013-02-28 DIAGNOSIS — R0789 Other chest pain: Secondary | ICD-10-CM

## 2013-02-28 DIAGNOSIS — I10 Essential (primary) hypertension: Secondary | ICD-10-CM

## 2013-02-28 MED ORDER — AMOXICILLIN 500 MG PO TABS: 500.0000 mg | ORAL_TABLET | Freq: Three times a day (TID) | ORAL | Status: DC

## 2013-02-28 NOTE — Progress Notes (Signed)
  Subjective:    Patient ID: Evan Melendez, male    DOB: Aug 09, 1925, 79 y.o.   MRN: [id number]  HPI Patient is here today for hypotension that started today. Blood pressure at home was 104/44 per home health nurse. Patient also complains of a "tingling" feeling in his chest that happens off and on for a long time now. No doe, no true angina Daughter Bethann Berkshire) was up from [place]. She checked blod pressure and it was low at 104/60. They were concerned and came in PMH reviewed meds reviewed Orthostatics were completed. Compliance of medications were reviewed. Patient denies dizziness with standing. Review of Systems No cough,fever,swelling See above EKG stable compared to earlier this year    Objective:   Physical Exam Blood pressure was checked here several times was in the 130/70 range. Orthostatics were negative. Patient was walked in the hallways for several minutes blood pressure was rechecked and did not drop Lungs clear no crackle Heart was regular no gallop Extremities no edema Skin warm dry      Patient with history of cardiomyopathy hypertension on multiple medicines with a history of low blood pressure at home-no evidence of low blood pressure here. I believe the tingling in the chest is musculoskeletal EKG has no acute changes 25 to 30 minutes spent with family discussing all of this see discussion above Hypertension stable Bring blood pressure cuff on next visit see above Assessment & Plan:

## 2013-03-21 ENCOUNTER — Encounter: Payer: Self-pay | Admitting: Family Medicine

## 2013-03-21 ENCOUNTER — Ambulatory Visit (INDEPENDENT_AMBULATORY_CARE_PROVIDER_SITE_OTHER): Payer: Medicare Other | Admitting: Family Medicine

## 2013-03-21 VITALS — BP 142/78 | Ht 70.0 in | Wt 154.2 lb

## 2013-03-21 DIAGNOSIS — I10 Essential (primary) hypertension: Secondary | ICD-10-CM

## 2013-03-21 NOTE — Progress Notes (Signed)
  Subjective:    Patient ID: Evan Melendez, male    DOB: 11/21/1925, 79 y.o.   MRN: [id number]  HPI  Patient arrives for a follow up on blood pressure and stress.  A lot of stress with neighbors given him a hard time. Causing some trouble sleeping. Causing some trouble shakiness rapid heart spelled.  Claims compliance with blood pressure medication no obvious side effects walking some.  Review of Systems No chest pain no abdominal pain no shortness of breath ROS otherwise negative    Objective:   Physical Exam  Alert no acute distress. Lungs clear. Heart regular in rhythm. H&T normal. Blood pressure on repeat 138/80      Assessment & Plan:  Impression 1 hypertension good control #2 anxiety with stress discussed. Plan usea press on them sparingly. Maintain same dose blood pressure meds. Recheck in several months. WSL

## 2013-03-29 ENCOUNTER — Ambulatory Visit (INDEPENDENT_AMBULATORY_CARE_PROVIDER_SITE_OTHER): Payer: Medicare Other | Admitting: Family Medicine

## 2013-03-29 ENCOUNTER — Emergency Department (HOSPITAL_COMMUNITY): Payer: Medicare Other

## 2013-03-29 ENCOUNTER — Encounter: Payer: Self-pay | Admitting: Family Medicine

## 2013-03-29 ENCOUNTER — Observation Stay (HOSPITAL_COMMUNITY)
Admission: EM | Admit: 2013-03-29 | Discharge: 2013-03-30 | Disposition: A | Discharge status: Home / Self Care | Payer: Medicare Other | Attending: Family Medicine | Admitting: Family Medicine

## 2013-03-29 ENCOUNTER — Encounter (HOSPITAL_COMMUNITY): Payer: Self-pay | Admitting: *Deleted

## 2013-03-29 VITALS — BP 120/64 | Temp 99.7°F | Ht 68.0 in | Wt 152.4 lb

## 2013-03-29 DIAGNOSIS — M5137 Other intervertebral disc degeneration, lumbosacral region: Secondary | ICD-10-CM | POA: Insufficient documentation

## 2013-03-29 DIAGNOSIS — R079 Chest pain, unspecified: Secondary | ICD-10-CM

## 2013-03-29 DIAGNOSIS — R9431 Abnormal electrocardiogram [ECG] [EKG]: Secondary | ICD-10-CM | POA: Insufficient documentation

## 2013-03-29 DIAGNOSIS — E782 Mixed hyperlipidemia: Secondary | ICD-10-CM | POA: Insufficient documentation

## 2013-03-29 DIAGNOSIS — I129 Hypertensive chronic kidney disease with stage 1 through stage 4 chronic kidney disease, or unspecified chronic kidney disease: Secondary | ICD-10-CM | POA: Insufficient documentation

## 2013-03-29 DIAGNOSIS — N183 Chronic kidney disease, stage 3 unspecified: Secondary | ICD-10-CM | POA: Insufficient documentation

## 2013-03-29 DIAGNOSIS — I2589 Other forms of chronic ischemic heart disease: Secondary | ICD-10-CM | POA: Insufficient documentation

## 2013-03-29 DIAGNOSIS — N390 Urinary tract infection, site not specified: Secondary | ICD-10-CM | POA: Insufficient documentation

## 2013-03-29 DIAGNOSIS — R0600 Dyspnea, unspecified: Secondary | ICD-10-CM | POA: Diagnosis present

## 2013-03-29 DIAGNOSIS — I2510 Atherosclerotic heart disease of native coronary artery without angina pectoris: Secondary | ICD-10-CM | POA: Insufficient documentation

## 2013-03-29 DIAGNOSIS — R0602 Shortness of breath: Secondary | ICD-10-CM

## 2013-03-29 DIAGNOSIS — D696 Thrombocytopenia, unspecified: Secondary | ICD-10-CM | POA: Insufficient documentation

## 2013-03-29 DIAGNOSIS — R0789 Other chest pain: Principal | ICD-10-CM | POA: Insufficient documentation

## 2013-03-29 DIAGNOSIS — M109 Gout, unspecified: Secondary | ICD-10-CM | POA: Insufficient documentation

## 2013-03-29 DIAGNOSIS — I10 Essential (primary) hypertension: Secondary | ICD-10-CM | POA: Diagnosis present

## 2013-03-29 DIAGNOSIS — I5022 Chronic systolic (congestive) heart failure: Secondary | ICD-10-CM | POA: Diagnosis present

## 2013-03-29 DIAGNOSIS — R9401 Abnormal electroencephalogram [EEG]: Secondary | ICD-10-CM | POA: Insufficient documentation

## 2013-03-29 DIAGNOSIS — R112 Nausea with vomiting, unspecified: Secondary | ICD-10-CM | POA: Insufficient documentation

## 2013-03-29 DIAGNOSIS — F0390 Unspecified dementia without behavioral disturbance: Secondary | ICD-10-CM | POA: Insufficient documentation

## 2013-03-29 DIAGNOSIS — I509 Heart failure, unspecified: Secondary | ICD-10-CM | POA: Insufficient documentation

## 2013-03-29 DIAGNOSIS — R0609 Other forms of dyspnea: Secondary | ICD-10-CM | POA: Insufficient documentation

## 2013-03-29 DIAGNOSIS — D649 Anemia, unspecified: Secondary | ICD-10-CM | POA: Insufficient documentation

## 2013-03-29 DIAGNOSIS — I255 Ischemic cardiomyopathy: Secondary | ICD-10-CM | POA: Diagnosis present

## 2013-03-29 DIAGNOSIS — E876 Hypokalemia: Secondary | ICD-10-CM | POA: Insufficient documentation

## 2013-03-29 DIAGNOSIS — R0989 Other specified symptoms and signs involving the circulatory and respiratory systems: Secondary | ICD-10-CM | POA: Insufficient documentation

## 2013-03-29 DIAGNOSIS — M51379 Other intervertebral disc degeneration, lumbosacral region without mention of lumbar back pain or lower extremity pain: Secondary | ICD-10-CM | POA: Insufficient documentation

## 2013-03-29 DIAGNOSIS — Z951 Presence of aortocoronary bypass graft: Secondary | ICD-10-CM | POA: Insufficient documentation

## 2013-03-29 LAB — BASIC METABOLIC PANEL
BUN: 39 mg/dL — ABNORMAL HIGH (ref 6–23)
CO2: 26 mEq/L (ref 19–32)
Chloride: 101 mEq/L (ref 96–112)
GFR calc Af Amer: 36 mL/min — ABNORMAL LOW (ref >90)
Potassium: 3.4 mEq/L — ABNORMAL LOW (ref 3.5–5.1)

## 2013-03-29 LAB — HEPATIC FUNCTION PANEL
AST: 19 U/L (ref 0–37)
Albumin: 3.8 g/dL (ref 3.5–5.2)
Alkaline Phosphatase: 81 U/L (ref 39–117)
Total Bilirubin: 1.1 mg/dL (ref 0.3–1.2)
Total Protein: 6.5 g/dL (ref 6.0–8.3)

## 2013-03-29 LAB — CBC
HCT: 31.1 % — ABNORMAL LOW (ref 39.0–52.0)
Platelets: 92 10*3/uL — ABNORMAL LOW (ref 150–400)
RBC: 3.47 MIL/uL — ABNORMAL LOW (ref 4.22–5.81)
RDW: 14 % (ref 11.5–15.5)
WBC: 10.6 10*3/uL — ABNORMAL HIGH (ref 4.0–10.5)

## 2013-03-29 LAB — D-DIMER, QUANTITATIVE: D-Dimer, Quant: 0.61 ug/mL-FEU — ABNORMAL HIGH (ref 0.00–0.48)

## 2013-03-29 LAB — TROPONIN I: Troponin I: <0.3 ng/mL (ref <0.30)

## 2013-03-29 MED ORDER — HYDROCORTISONE 2.5 % RE CREA
1.0000 "application " | TOPICAL_CREAM | Freq: Two times a day (BID) | RECTAL | Status: DC | PRN
  Filled 2013-03-29: qty 28.35

## 2013-03-29 MED ORDER — HALOPERIDOL LACTATE 5 MG/ML IJ SOLN: 2.5000 mg | Freq: Four times a day (QID) | INTRAMUSCULAR | Status: DC | PRN

## 2013-03-29 MED ORDER — ALLOPURINOL 100 MG PO TABS
100.0000 mg | ORAL_TABLET | Freq: Every day | ORAL | Status: DC
  Administered 2013-03-29: 100 mg via ORAL
  Filled 2013-03-29: qty 1

## 2013-03-29 MED ORDER — ENALAPRIL MALEATE 5 MG PO TABS
10.0000 mg | ORAL_TABLET | Freq: Every morning | ORAL | Status: DC
  Administered 2013-03-30: 10 mg via ORAL
  Filled 2013-03-29 (×2): qty 1

## 2013-03-29 MED ORDER — ASPIRIN 81 MG PO CHEW
81.0000 mg | CHEWABLE_TABLET | Freq: Every morning | ORAL | Status: DC
  Administered 2013-03-30: 81 mg via ORAL
  Filled 2013-03-29: qty 1

## 2013-03-29 MED ORDER — POLYETHYLENE GLYCOL 3350 17 G PO PACK: 17.0000 g | PACK | Freq: Every day | ORAL | Status: DC | PRN

## 2013-03-29 MED ORDER — ALPRAZOLAM 0.5 MG PO TABS: 0.5000 mg | ORAL_TABLET | Freq: Three times a day (TID) | ORAL | Status: DC | PRN

## 2013-03-29 MED ORDER — NITROGLYCERIN 0.4 MG SL SUBL: 0.4000 mg | SUBLINGUAL_TABLET | SUBLINGUAL | Status: DC | PRN

## 2013-03-29 MED ORDER — BISACODYL 10 MG RE SUPP: 10.0000 mg | Freq: Every day | RECTAL | Status: DC | PRN

## 2013-03-29 MED ORDER — LORATADINE 10 MG PO TABS: 10.0000 mg | ORAL_TABLET | Freq: Every day | ORAL | Status: DC | PRN

## 2013-03-29 MED ORDER — FUROSEMIDE 40 MG PO TABS
40.0000 mg | ORAL_TABLET | Freq: Every day | ORAL | Status: DC
  Administered 2013-03-30: 40 mg via ORAL
  Filled 2013-03-29: qty 1

## 2013-03-29 MED ORDER — POTASSIUM CHLORIDE CRYS ER 20 MEQ PO TBCR
40.0000 meq | EXTENDED_RELEASE_TABLET | Freq: Every morning | ORAL | Status: DC
  Administered 2013-03-30: 40 meq via ORAL
  Filled 2013-03-29: qty 2

## 2013-03-29 MED ORDER — ONDANSETRON HCL 4 MG/2ML IJ SOLN: 4.0000 mg | Freq: Three times a day (TID) | INTRAMUSCULAR | Status: DC | PRN

## 2013-03-29 MED ORDER — FLEET ENEMA 7-19 GM/118ML RE ENEM: 1.0000 | ENEMA | Freq: Once | RECTAL | Status: AC | PRN

## 2013-03-29 MED ORDER — ONDANSETRON HCL 4 MG/2ML IJ SOLN: 4.0000 mg | INTRAMUSCULAR | Status: DC | PRN

## 2013-03-29 MED ORDER — ENOXAPARIN SODIUM 30 MG/0.3ML ~~LOC~~ SOLN
30.0000 mg | SUBCUTANEOUS | Status: DC
  Administered 2013-03-30: 30 mg via SUBCUTANEOUS
  Filled 2013-03-29: qty 0.3

## 2013-03-29 MED ORDER — PANTOPRAZOLE SODIUM 40 MG PO TBEC
40.0000 mg | DELAYED_RELEASE_TABLET | Freq: Every day | ORAL | Status: DC
  Administered 2013-03-29 – 2013-03-30 (×2): 40 mg via ORAL
  Filled 2013-03-29 (×2): qty 1

## 2013-03-29 MED ORDER — CARVEDILOL 12.5 MG PO TABS
12.5000 mg | ORAL_TABLET | Freq: Two times a day (BID) | ORAL | Status: DC
  Administered 2013-03-30: 12.5 mg via ORAL
  Filled 2013-03-29: qty 1

## 2013-03-29 MED ORDER — ACETAMINOPHEN 325 MG PO TABS: 650.0000 mg | ORAL_TABLET | Freq: Four times a day (QID) | ORAL | Status: DC | PRN

## 2013-03-29 MED ORDER — SIMVASTATIN 20 MG PO TABS: 40.0000 mg | ORAL_TABLET | Freq: Every evening | ORAL | Status: DC

## 2013-03-29 NOTE — ED Provider Notes (Signed)
CSN: [id number]     Arrival date & time 03/29/13  4098 History  This chart was scribed for Glynn Octave, MD by Carl Best, ED Scribe and Bennett Scrape, ED Scribe. This patient was seen in room APA07/APA07 and the patient's care was started at 6:50 PM.    CC: SOB  The history is provided by the patient. No language interpreter was used.    HPI Comments: Evan Melendez is a 79 y.o. male who presents to the Emergency Department complaining of intermittent SOB that has occurred for the past week and has been worsening.  He states that he experiences "a little" dizziness with sudden movements.  Patient denies being SOB now.  Patient was sent to the ED by his PCP for cardiac evaluation.  He is unable to provide any more detail.  He denies lightheadedness, chest pain, nausea, emesis, abdominal pain and any change in appetite. Patient is not on any anticoagulants.  Patient does not have a pacemaker or defibrillator.  Patient has a prior h/o MI and received bypass in 2003.     Cardiologist is Dr. Dietrich Pates.   Past Medical History  Diagnosis Date  . Essential hypertension, benign   . Anemia   . Gout   . ED (erectile dysfunction)   . Mixed hyperlipidemia   . Coronary atherosclerosis of native coronary artery     Multivessel status post CABG  . Impaired fasting glucose   . Insomnia   . CTS (carpal tunnel syndrome)   . Chronic systolic heart failure   . Myocardial infarction 1985  . Ischemic cardiomyopathy     LVEF 30% with severe LVH  . DDD (degenerative disc disease), lumbar   . CHF (congestive heart failure)    Past Surgical History  Procedure Laterality Date  . Appendectomy    . Prostatectomy    . Cholecystectomy    . Tonsillectomy    . Colonoscopy    . Inguinal hernia repair    . Coronary artery bypass graft  2004    Dr. Cornelius Moras - LIMA to LAD, SVG to circumflex, SVG to RCA   Family History  Problem Relation Age of Onset  . Dementia Father   . Rheum arthritis Mother     History  Substance Use Topics  . Smoking status: Former Smoker    Types: Cigarettes  . Smokeless tobacco: Not on file  . Alcohol Use: No    Review of Systems  A complete 10 system review of systems was obtained and all systems are negative except as noted in the HPI and PMH.   Allergies  Quinidine  Home Medications   No current outpatient prescriptions on file. Triage Vitals: BP 140/86  Pulse 90  Temp(Src) 98.6 F (37 C) (Oral)  Resp 20  Ht 5\' 10"  (1.778 m)  Wt 147 lb (66.679 kg)  BMI 21.09 kg/m2  SpO2 95% Physical Exam  Nursing note and vitals reviewed. Constitutional: He is oriented to person, place, and time. He appears well-developed and well-nourished.  HENT:  Head: Normocephalic and atraumatic.  Right Ear: External ear normal.  Left Ear: External ear normal.  Mouth/Throat: Oropharynx is clear and moist.  Eyes: Conjunctivae and EOM are normal. Pupils are equal, round, and reactive to light.  Neck: Normal range of motion and phonation normal. Neck supple.  Cardiovascular: Normal rate, regular rhythm, normal heart sounds and intact distal pulses.   Pulmonary/Chest: Effort normal and breath sounds normal. He exhibits no bony tenderness.  Abdominal: Soft. Normal appearance.  There is no tenderness.  Musculoskeletal: Normal range of motion.  Neurological: He is alert and oriented to person, place, and time. He has normal strength. No cranial nerve deficit or sensory deficit. He exhibits normal muscle tone. Coordination normal.  Skin: Skin is warm, dry and intact.  Psychiatric: He has a normal mood and affect. His behavior is normal. Judgment and thought content normal.    ED Course  Procedures (including critical care time)  DIAGNOSTIC STUDIES: Oxygen Saturation is 100% on room air, normal by my interpretation.    COORDINATION OF CARE: 6:53 PM- Discussed obtaining an X-ray and blood work with patient at bedside and patient agreed.    7:49 PM-Consult complete  with Dr. Patty Sermons, cardiologist. Patient case explained and discussed. Dr. Patty Sermons advises that EKG changes are not representative of ischemia and states that patient can be admitted to [facility] for further evaluation and treatment. Call ended at  7:55 PM- Discussed EKG results with patient and discussed admitting him overnight to administer more blood tests at patient's bedside and patient agreed.  Patient denies being in any pain. 8:19 PM-Consult complete with Dr. Orvan Falconer, Hospitalist. Patient case explained and discussed. Dr. Orvan Falconer agrees to admit patient to obs tele, team 1 for further evaluation and treatment. Call ended at 8:20 PM.  Labs Review Labs Reviewed  CBC - Abnormal; Notable for the following:    WBC 10.6 (*)    RBC 3.47 (*)    Hemoglobin 10.5 (*)    HCT 31.1 (*)    Platelets 92 (*)    All other components within normal limits  BASIC METABOLIC PANEL - Abnormal; Notable for the following:    Potassium 3.4 (*)    Glucose, Bld 198 (*)    BUN 39 (*)    Creatinine, Ser 1.88 (*)    GFR calc non Af Amer 31 (*)    GFR calc Af Amer 36 (*)    All other components within normal limits  PRO B NATRIURETIC PEPTIDE - Abnormal; Notable for the following:    Pro B Natriuretic peptide (BNP) 2578.0 (*)    All other components within normal limits  D-DIMER, QUANTITATIVE - Abnormal; Notable for the following:    D-Dimer, Quant 0.61 (*)    All other components within normal limits  URINALYSIS, ROUTINE W REFLEX MICROSCOPIC - Abnormal; Notable for the following:    Specific Gravity, Urine >1.030 (*)    Hgb urine dipstick MODERATE (*)    Bilirubin Urine SMALL (*)    Protein, ur 100 (*)    Nitrite POSITIVE (*)    Leukocytes, UA SMALL (*)    All other components within normal limits  URINE MICROSCOPIC-ADD ON - Abnormal; Notable for the following:    Bacteria, UA MANY (*)    All other components within normal limits  URINE CULTURE  TROPONIN I  MAGNESIUM  HEPATIC FUNCTION PANEL   BASIC METABOLIC PANEL  CBC  TROPONIN I  TROPONIN I  VITAMIN B12  IRON AND TIBC  FERRITIN  TSH  TROPONIN I   Imaging Review Dg Chest Portable 1 View  03/29/2013   CLINICAL DATA:  Shortness of breath. Dizziness. Congestive heart failure. Prior myocardial infarction.  EXAM: PORTABLE CHEST - 1 VIEW  COMPARISON:  11/12/2012  FINDINGS: The patient is rotated to the right on today's radiograph, reducing diagnostic sensitivity and specificity. Tortuous and atherosclerotic thoracic aorta. Heart size within normal limits. No edema.  Prior CABG noted.  No pleural effusion. The lungs appear clear.  IMPRESSION: 1. Atherosclerosis and prior CABG. No acute radiographic findings.   Electronically Signed   By: Herbie Baltimore   On: 03/29/2013 19:10    MDM   1. EKG abnormality   2. Dyspnea    History of CAD status post CABG ischemic cardiac myopathy presenting from PCPs office with abnormal EKG. He denies any current chest pain or shortness of breath.  CXR negative. Troponin negative. EKG with worsening ST depressions. D/w DR. Brackbill of cardiology who viewed EKG and feels ST Changes are more due to RBBB than ischemia. He does not feel patient needs [facility] to [facility]. With intermittent dyspena, he feels serial troponins are indicated. D-dimer slightly elevated but Cr precludes CTA.  LOw suspicion for PE. VQ SCan can be obtained as inpatient. D/w Dr. Orvan Falconer.   Date: 03/29/2013  Rate: 88  Rhythm: normal sinus rhythm  QRS Axis: normal  Intervals: normal  ST/T Wave abnormalities: ST depressions anteriorly and ST depressions laterally  Conduction Disutrbances:nonspecific intraventricular conduction delay  Narrative Interpretation: Worsening ST depressions anterolaterally  Old EKG Reviewed: changes noted     I personally performed the services described in this documentation, which was scribed in my presence. The recorded information has been reviewed and is accurate.          Glynn Octave, MD 03/30/13 [phone]

## 2013-03-29 NOTE — H&P (Signed)
[facility] History and Physical  Evan Melendez  ZOX:[id number]  DOB: 1926/05/14   DOA: 03/29/2013   PCP:   Harlow Asa, MD   Chief Complaint:  Intermittent left-sided discomfort for one week  HPI: Evan Melendez is a 79 y.o. male.   Elderly Caucasian gentleman with a history of coronary artery disease status post CABG, presented to his primary care physician with the above symptoms today. He says he is not really been having a pain ; the left chest discomfort, no clear aggravating or relieving factors; not clearly related to exertion or rest; no radiation. It seems he is also been having episodic shortness of breath but this is onclear. He visited his primary care physician's office and while there became nauseous and vomited. An EEG was done which was abnormal and he was sent to the emergency room for evaluation. In the emergency room he is completely pain-free but his EKG was felt to be abnormal compared to previous EKGs in the hospitalist service was called for admission.  The patient has mildly impaired memory and does not seem to be able to remember all the details of his illness. He lives alone, although his daughter accompanies him to this visit..  There is no history of palpitations, leg edema, or worsening dyspnea on exertion.   He was admitted to this facility a few months ago and had severe sundowning after a couple days of admission  Rewiew of Systems:   All systems negative except as marked bold or noted in the HPI;  Constitutional:    malaise, fever and chills. ;  Eyes:   eye pain, redness and discharge. ;  ENMT:   ear pain, hoarseness, nasal congestion, sinus pressure and sore throat. ; hoh   Respiratory:   cough, hemoptysis, wheezing and stridor. ;  Gastrointestinal:  nausea, vomiting, diarrhea, constipation, abdominal pain, melena, blood in stool, hematemesis, jaundice and rectal bleeding. unusual weight loss..   Genitourinary:    frequency, dysuria,  incontinence,flank pain and hematuria; Musculoskeletal:   back pain and neck pain.  swelling and trauma.;  Skin: .  pruritus, rash, abrasions, bruising and skin lesion.; ulcerations Neuro:    headache, lightheadedness and neck stiffness.  weakness, altered level of consciousness, altered mental status, extremity weakness, burning feet, involuntary movement, seizure and syncope.  Psych:    anxiety, depression, insomnia, tearfulness, panic attacks, hallucinations, paranoia, suicidal or homicidal ideation    Past Medical History  Diagnosis Date  . Essential hypertension, benign   . Anemia   . Gout   . ED (erectile dysfunction)   . Mixed hyperlipidemia   . Coronary atherosclerosis of native coronary artery     Multivessel status post CABG  . Impaired fasting glucose   . Insomnia   . CTS (carpal tunnel syndrome)   . Chronic systolic heart failure   . Myocardial infarction 1985  . Ischemic cardiomyopathy     LVEF 30% with severe LVH  . DDD (degenerative disc disease), lumbar   . CHF (congestive heart failure)     Past Surgical History  Procedure Laterality Date  . Appendectomy    . Prostatectomy    . Cholecystectomy    . Tonsillectomy    . Colonoscopy    . Inguinal hernia repair    . Coronary artery bypass graft  2004    Dr. Cornelius Moras - LIMA to LAD, SVG to circumflex, SVG to RCA    Medications:  HOME MEDS: Prior to Admission medications   Medication Sig  Start Date End Date Taking? Authorizing Provider  allopurinol (ZYLOPRIM) 100 MG tablet Take 100 mg by mouth at bedtime.    Yes Historical Provider, MD  Ascorbic Acid (VITAMIN C) 100 MG tablet Take 100 mg by mouth daily.   Yes Historical Provider, MD  aspirin 81 MG tablet Take 81 mg by mouth every morning.    Yes Historical Provider, MD  carvedilol (COREG) 12.5 MG tablet Take 1 tablet (12.5 mg total) by mouth 2 (two) times daily with a meal. 11/27/12  Yes Jonelle Sidle, MD  enalapril (VASOTEC) 10 MG tablet Take 10 mg by mouth  every morning.    Yes Historical Provider, MD  ferrous sulfate 325 (65 FE) MG tablet Take 325 mg by mouth 2 (two) times daily.    Yes Historical Provider, MD  furosemide (LASIX) 40 MG tablet Take 1 tablet (40 mg total) by mouth daily. 12/21/12  Yes Merlyn Albert, MD  loratadine (CLARITIN) 10 MG tablet Take 10 mg by mouth daily as needed for allergies.   Yes Historical Provider, MD  potassium chloride SA (K-DUR,KLOR-CON) 20 MEQ tablet Take 40 mEq by mouth every morning.   Yes Historical Provider, MD  Pyridoxine HCl (VITAMIN B-6 PO) Take 1 tablet by mouth daily.    Yes Historical Provider, MD  simvastatin (ZOCOR) 40 MG tablet Take 40 mg by mouth every evening.   Yes Historical Provider, MD  ALPRAZolam Prudy Feeler) 0.5 MG tablet Take 1 tablet (0.5 mg total) by mouth 3 (three) times daily as needed for sleep or anxiety. 02/21/13 02/21/14  Merlyn Albert, MD  hydrocortisone (ANUSOL-HC) 2.5 % rectal cream Place 1 application rectally 2 (two) times daily as needed for hemorrhoids.    Historical Provider, MD     Allergies:  Allergies  Allergen Reactions  . Quinidine Other (See Comments)    Chest Pain    Social History:   reports that he has quit smoking. His smoking use included Cigarettes. He smoked 0.00 packs per day. He does not have any smokeless tobacco history on file. He reports that he does not drink alcohol or use illicit drugs.  Family History: Family History  Problem Relation Age of Onset  . Dementia Father   . Rheum arthritis Mother      Physical Exam: Filed Vitals:   03/29/13 1835 03/29/13 2127  BP: 140/86 119/57  Pulse: 90 77  Temp: 98.6 F (37 C)   TempSrc: Oral   Resp: 20   Height: 5\' 10"  (1.778 m)   Weight: 66.679 kg (147 lb)   SpO2: 95% 96%   Blood pressure 119/57, pulse 77, temperature 98.6 F (37 C), temperature source Oral, resp. rate 20, height 5\' 10"  (1.778 m), weight 66.679 kg (147 lb), SpO2 96.00%. Body mass index is 21.09 kg/(m^2).   GEN:  Pleasant  elderly Caucasian gentleman lying bed in no acute distress; cooperative with exam PSYCH:  alert and oriented;   affect is appropriate. Short and intermediate term memory seems impaired as he gives a history. HEENT: Mucous membranes pink and anicteric; PERRLA; EOM intact; no cervical lymphadenopathy nor thyromegaly or carotid bruit; no JVD; Breasts:: Not examined CHEST WALL: No tenderness CHEST: Normal respiration, clear to auscultation bilaterally HEART: Regular rate and rhythm; 1/6 systolic BACK: no scoliosis; no CVA tenderness ABDOMEN:  soft non-tender; no masses, no organomegaly, normal abdominal bowel sounds;  no intertriginous candida. Rectal Exam: Not done EXTREMITIES:  age-appropriate arthropathy of the hands and knees; no edema; no ulcerations. Genitalia: not examined  PULSES: 2+ and symmetric SKIN: Normal hydration no rash or ulceration CNS: Cranial nerves 2-12 grossly intact no focal lateralizing neurologic deficit   Labs on Admission:  Basic Metabolic Panel:  Recent Labs Lab 03/29/13 1859  NA 138  K 3.4*  CL 101  CO2 26  GLUCOSE 198*  BUN 39*  CREATININE 1.88*  CALCIUM 9.1   Liver Function Tests: No results found for this basename: AST, ALT, ALKPHOS, BILITOT, PROT, ALBUMIN,  in the last 168 hours No results found for this basename: LIPASE, AMYLASE,  in the last 168 hours No results found for this basename: AMMONIA,  in the last 168 hours CBC:  Recent Labs Lab 03/29/13 1859  WBC 10.6*  HGB 10.5*  HCT 31.1*  MCV 89.6  PLT 92*   Cardiac Enzymes:  Recent Labs Lab 03/29/13 1859  TROPONINI <0.30   BNP: No components found with this basename: POCBNP,  D-dimer: No components found with this basename: D-DIMER,  CBG: No results found for this basename: GLUCAP,  in the last 168 hours  Radiological Exams on Admission: Dg Chest Portable 1 View  03/29/2013   CLINICAL DATA:  Shortness of breath. Dizziness. Congestive heart failure. Prior myocardial infarction.   EXAM: PORTABLE CHEST - 1 VIEW  COMPARISON:  11/12/2012  FINDINGS: The patient is rotated to the right on today's radiograph, reducing diagnostic sensitivity and specificity. Tortuous and atherosclerotic thoracic aorta. Heart size within normal limits. No edema.  Prior CABG noted.  No pleural effusion. The lungs appear clear.  IMPRESSION: 1. Atherosclerosis and prior CABG. No acute radiographic findings.   Electronically Signed   By: Herbie Baltimore   On: 03/29/2013 19:10    EKG: Independently reviewed. Sinus rhythm; Nonspecific intraventricular conduction delay; anterior and lateral ST segment depression   Assessment/Plan   Active Problems: Recurrent chest discomfort rule out unstable angina   Essential hypertension, benign   Gout   Anemia   Chronic systolic heart failure   CKD (chronic kidney disease) stage 3, GFR 30-59 ml/min   Ischemic cardiomyopathy   Thrombocytopenia, unspecified   Dyspnea   EKG abnormality Hypokalemia  PLAN: We'll bring this gentleman on observation for serial cardiac enzymes, and cardiac monitoring. Can likely be discharged home if abnormalities are found within the next 24 hrs.  He does have a history of chronic anemia but the cause is not well documented; will check an anemia panel His thrombocytopenia has been extensively investigated. We'll hydrate him gently and temporarily hold his  When necessary Haldol for agitation   Other plans as per orders.  Code Status: Full Family Communication: Plans discuss with patient daughter family at bedside Disposition Plan: Home within a day if no problem; home asap to prevent in-hospital morbidity    Evan Melendez Nocturnist [facility] Pager [phone]   03/29/2013, 10:26 PM

## 2013-03-29 NOTE — Progress Notes (Signed)
  Subjective:    Patient ID: Evan Melendez, male    DOB: 04-03-1926, 79 y.o.   MRN: [id number]  Shortness of Breath This is a new problem. The current episode started in the past 7 days. The problem occurs constantly. The problem has been unchanged. Associated symptoms include vomiting. Nothing aggravates the symptoms. Associated symptoms comments: cough. The patient has no known risk factors for DVT/PE. He has tried nothing for the symptoms. The treatment provided no relief.    nonspec chest discomfort less than a minute  Short of breath with exertion  No major change, felt nauseated, some symptoms with exertion.  Patient has history of bypass surgery 10 years ago.  Patient is been experienced an atypical discomfort off-and-on in recent weeks but worse in recent days.  Patient actually vomited here in the office while waiting to see [place]  Review of Systems  Respiratory: Positive for shortness of breath.   Gastrointestinal: Positive for vomiting.   ROS otherwise negative    Objective:   Physical Exam  Alert no acute distress lungs clear to somewhat anxious appearing heart regular in rhythm. Vitals reviewed. H&T normal.  EKG right bundle branch block left anterior hemiblock. Sinus rhythm.      Assessment & Plan:  Impression atypical chest discomfort worrisome with this patient. Positive known history of coronary artery disease. At his age high risk of atypical nature. Long discussion with patient with nurse with the patient's daughter with emergency room doctor. Plan workup urgently for potential coronary ischemia. Easily 40 minutes spent most in discussion with various folks regarding proper management. WSL patient to go directly to the emergency room.

## 2013-03-29 NOTE — ED Notes (Addendum)
Sent to ER by Dr Gerda Diss for heart eval, denies pain, alert,

## 2013-03-30 DIAGNOSIS — I2589 Other forms of chronic ischemic heart disease: Secondary | ICD-10-CM

## 2013-03-30 DIAGNOSIS — I5022 Chronic systolic (congestive) heart failure: Secondary | ICD-10-CM

## 2013-03-30 DIAGNOSIS — R0789 Other chest pain: Secondary | ICD-10-CM | POA: Diagnosis present

## 2013-03-30 LAB — URINALYSIS, ROUTINE W REFLEX MICROSCOPIC
Glucose, UA: NEGATIVE mg/dL
Protein, ur: 100 mg/dL — AB

## 2013-03-30 LAB — BASIC METABOLIC PANEL
CO2: 27 mEq/L (ref 19–32)
Calcium: 8.5 mg/dL (ref 8.4–10.5)
Chloride: 103 mEq/L (ref 96–112)
Glucose, Bld: 139 mg/dL — ABNORMAL HIGH (ref 70–99)
Potassium: 3.3 mEq/L — ABNORMAL LOW (ref 3.5–5.1)
Sodium: 140 mEq/L (ref 135–145)

## 2013-03-30 LAB — TSH: TSH: 0.583 u[IU]/mL (ref 0.350–4.500)

## 2013-03-30 LAB — CBC
HCT: 30.1 % — ABNORMAL LOW (ref 39.0–52.0)
Hemoglobin: 10.1 g/dL — ABNORMAL LOW (ref 13.0–17.0)
MCH: 30 pg (ref 26.0–34.0)
MCV: 89.3 fL (ref 78.0–100.0)
RBC: 3.37 MIL/uL — ABNORMAL LOW (ref 4.22–5.81)

## 2013-03-30 LAB — FERRITIN: Ferritin: 34 ng/mL (ref 22–322)

## 2013-03-30 LAB — IRON AND TIBC
Iron: 21 ug/dL — ABNORMAL LOW (ref 42–135)
Saturation Ratios: 6 % — ABNORMAL LOW (ref 20–55)
TIBC: 326 ug/dL (ref 215–435)
UIBC: 305 ug/dL (ref 125–400)

## 2013-03-30 LAB — URINE MICROSCOPIC-ADD ON

## 2013-03-30 MED ORDER — ENOXAPARIN SODIUM 40 MG/0.4ML ~~LOC~~ SOLN: 40.0000 mg | SUBCUTANEOUS | Status: DC

## 2013-03-30 MED ORDER — CEFUROXIME AXETIL 250 MG PO TABS: 250.0000 mg | ORAL_TABLET | Freq: Two times a day (BID) | ORAL | Status: DC

## 2013-03-30 NOTE — Discharge Summary (Addendum)
Physician Discharge Summary  Evan Melendez ZOX:[id number] DOB: 06-Mar-1926 DOA: 03/29/2013  PCP: Harlow Asa, MD  Admit date: 03/29/2013 Discharge date: 03/30/2013  Recommendations for Outpatient Follow-up:  1. Followup chronic systolic congestive heart failure 2. Following resolution of UTI, urine culture   Pending studies:  TSH  Urine culture  Follow-up Information   Follow up with Harlow Asa, MD In 2 weeks.   Specialty:  Family Medicine   Contact information:   [address] [place] [address] [phone]      Discharge Diagnoses:  1. Atypical chest pain 2. Possible dyspnea 3. Vomiting 4. UTI 5. Stable ischemic cardiomyopathy, chronic systolic congestive heart failure, coronary artery disease  Discharge Condition: Improved Disposition: Home  Diet recommendation: Heart healthy  Filed Weights   03/29/13 1835 03/30/13 0557  Weight: 66.679 kg (147 lb) 68.085 kg (150 lb 1.6 oz)    History of present illness:  79 year old man with history of coronary artery disease, CABG who presented to his primary care physician's office with history of chest pain or shortness of breath over the last week. He had some vomiting in the office. EKG done in the office was abnormal and he was sent to the emergency department for further evaluation. Because of abnormal EKG cardiology recommended admission for further evaluation.  Hospital Course:  Evan Melendez was admitted for observation. He had no recurrence of atypical chest pain. Troponins were negative. Upon further review EKG demonstrated bundle branch block pattern which is similar to previous studies. There are no signs or symptoms to suggest ischemia which was also the opinion of the cardiologist consulted by telephone prior to admission. Telemetry unremarkable. Chronic comorbidities have remained stable. He has known coronary artery disease, ischemic cardiomyopathy and NYHA class II dyspnea. Hospitalization was uncomplicated.  Individual issues as below.  1. Possible dyspnea: According to chart review this is been present over the last week although the patient denies this now. No hypoxia or tachypnea. Lung sounds are clear and chest x-ray was unremarkable. Significance unclear. D-dimer mildly elevated, Wells score is 0. On previous cardiology outpatient evaluation he was noted to have NYHA class II dyspnea. I suspect this is the etiology. Weight is stable. No further evaluation is suggested. 2. Atypical chest pain: Resolved. Cardiac enzymes negative thus far. Telemetry unremarkable. No history, exam findings or data to suggest ACS. Possibly related to stress, see below. 3. History of coronary artery disease status post CABG, ischemic cardiomyopathy, chronic systolic heart failure: Weight very stable. No pulmonary symptoms. Continue current therapy. Followed by cardiology, ischemic cardiomyopathy with fairly large area of scar involving the inferior and inferoseptal myocardium, LVEF 28%. After outpatient discussion, plan was to continue conservative management. 4. One episode of vomiting: Etiology unclear. Abdominal exam benign. No recurrence. Monitor clinically. 5. Positive d-dimer: See above. No further evaluation is indicated. 6. UTI: Followup urine culture. Empiric antibiotics. 7. Chronic kidney disease stage III: Appears stable.  8. Chronic anemia: Stable. Followup as an outpatient. 9. Thrombocytopenia: Followup as an outpatient. 10. Dementia with history of sundowning: Currently stable. 11. A lot of stress with neighbors given him a hard time. Causing some trouble sleeping.   Consultants:  None Procedures:  None   Discharge Instructions  Discharge Orders   Future Appointments Provider Department Dept Phone   04/09/2013 9:00 AM Jonelle Sidle, MD [facility] [place] [place] [phone]   06/10/2013 1:30 PM Merlyn Albert, MD [facility] [phone]   Future Orders Complete By Expires  Activity as tolerated - No restrictions  As directed    Diet - low sodium heart healthy  As directed    Discharge instructions  As directed    Comments:     Call physician or seek immediate medical attention for chest pain, shortness of breath or worsening of condition. You have been prescribed Ceftin which is an antibiotic for a bladder infection.       Medication List         allopurinol 100 MG tablet  Commonly known as:  ZYLOPRIM  Take 100 mg by mouth at bedtime.     ALPRAZolam 0.5 MG tablet  Commonly known as:  XANAX  Take 1 tablet (0.5 mg total) by mouth 3 (three) times daily as needed for sleep or anxiety.     aspirin 81 MG tablet  Take 81 mg by mouth every morning.     carvedilol 12.5 MG tablet  Commonly known as:  COREG  Take 1 tablet (12.5 mg total) by mouth 2 (two) times daily with a meal.     cefUROXime 250 MG tablet  Commonly known as:  CEFTIN  Take 1 tablet (250 mg total) by mouth 2 (two) times daily with a meal.     enalapril 10 MG tablet  Commonly known as:  VASOTEC  Take 10 mg by mouth every morning.     ferrous sulfate 325 (65 FE) MG tablet  Take 325 mg by mouth 2 (two) times daily.     furosemide 40 MG tablet  Commonly known as:  LASIX  Take 1 tablet (40 mg total) by mouth daily.     hydrocortisone 2.5 % rectal cream  Commonly known as:  ANUSOL-HC  Place 1 application rectally 2 (two) times daily as needed for hemorrhoids.     loratadine 10 MG tablet  Commonly known as:  CLARITIN  Take 10 mg by mouth daily as needed for allergies.     potassium chloride SA 20 MEQ tablet  Commonly known as:  K-DUR,KLOR-CON  Take 40 mEq by mouth every morning.     simvastatin 40 MG tablet  Commonly known as:  ZOCOR  Take 40 mg by mouth every evening.     VITAMIN B-6 PO  Take 1 tablet by mouth daily.     vitamin C 100 MG tablet  Take 100 mg by mouth daily.       Allergies  Allergen Reactions  . Quinidine Other (See Comments)    Chest Pain    The  results of significant diagnostics from this hospitalization (including imaging, microbiology, ancillary and laboratory) are listed below for reference.    Significant Diagnostic Studies: Dg Chest Portable 1 View  03/29/2013   CLINICAL DATA:  Shortness of breath. Dizziness. Congestive heart failure. Prior myocardial infarction.  EXAM: PORTABLE CHEST - 1 VIEW  COMPARISON:  11/12/2012  FINDINGS: The patient is rotated to the right on today's radiograph, reducing diagnostic sensitivity and specificity. Tortuous and atherosclerotic thoracic aorta. Heart size within normal limits. No edema.  Prior CABG noted.  No pleural effusion. The lungs appear clear.  IMPRESSION: 1. Atherosclerosis and prior CABG. No acute radiographic findings.   Electronically Signed   By: Herbie Baltimore   On: 03/29/2013 19:10    Labs: Basic Metabolic Panel:  Recent Labs Lab 03/29/13 1859 03/30/13 0701  NA 138 140  K 3.4* 3.3*  CL 101 103  CO2 26 27  GLUCOSE 198* 139*  BUN 39* 34*  CREATININE 1.88* 1.46*  CALCIUM 9.1 8.5  MG 2.0  --    Liver Function Tests:  Recent Labs Lab 03/29/13 1859  AST 19  ALT 13  ALKPHOS 81  BILITOT 1.1  PROT 6.5  ALBUMIN 3.8   CBC:  Recent Labs Lab 03/29/13 1859 03/30/13 0701  WBC 10.6* 9.0  HGB 10.5* 10.1*  HCT 31.1* 30.1*  MCV 89.6 89.3  PLT 92* 88*   Cardiac Enzymes:  Recent Labs Lab 03/29/13 1859 03/30/13 0136 03/30/13 0701 03/30/13 1247  TROPONINI <0.30 <0.30 <0.30 <0.30     Recent Labs  11/11/12 0940 11/12/12 0500 03/29/13 1859  PROBNP 13370.0* 16206.0* 2578.0*    Active Problems:   Essential hypertension, benign   Gout   Anemia   Chronic systolic heart failure   CKD (chronic kidney disease) stage 3, GFR 30-59 ml/min   Ischemic cardiomyopathy   Thrombocytopenia, unspecified   Dyspnea   EKG abnormality   Chest discomfort   Time coordinating discharge: 35 minutes  Signed:  Brendia Sacks, MD [facility] 03/30/2013, 2:10  PM

## 2013-03-30 NOTE — Progress Notes (Signed)
[facility] PROGRESS NOTE  Evan Melendez WUJ:[id number] DOB: 08-08-1925 DOA: 03/29/2013 PCP: Harlow Asa, MD Cardiologist is Dr. Dietrich Pates.   Assessment/Plan: 1. Possible dyspnea: According to chart review this is been present over the last week although the patient denies this now. No hypoxia or tachypnea. Lung sounds are clear and chest x-ray was unremarkable. Significance unclear. D-dimer mildly elevated, Wells score is 0. On previous cardiology outpatient evaluation he was noted to have NYHA class II dyspnea. I suspect this is the etiology. Weight is stable. No further evaluation is suggested. 2. Atypical chest pain: Resolved. Cardiac enzymes negative thus far. Telemetry unremarkable. No history, exam findings or data to suggest ACS. Possibly related to stress, see below. 3. History of coronary artery disease status post CABG, ischemic cardiomyopathy, chronic systolic heart failure: Weight very stable. No pulmonary symptoms. Continue current therapy. Followed by cardiology, ischemic cardiomyopathy with fairly large area of scar involving the inferior and inferoseptal myocardium, LVEF 28%. After outpatient discussion, plan was to continue conservative management. 4. One episode of vomiting: Etiology unclear. Abdominal exam benign. No recurrence. Monitor clinically. 5. Positive d-dimer: See above. No further evaluation is indicated. 6. UTI: Followup urine culture. Empiric antibiotics. 7. Chronic kidney disease stage III: Appears stable.  8. Chronic anemia: Stable. Followup as an outpatient. 9. Thrombocytopenia: Followup as an outpatient. 10. Dementia with history of sundowning: Currently stable. 11. A lot of stress with neighbors given him a hard time. Causing some trouble sleeping.     followup last troponin.  Anticipate discharge home later today  Empiric antibiotics, followup urine culture  Pending studies:    TSH  Urine culture  Code Status:  full code  DVT prophylaxis:   Lovenox Family Communication:  discussed with daughter at bedside  Disposition Plan:  as above   Brendia Sacks, MD  [facility]  Pager [phone] If 7PM-7AM, please contact night-coverage at [email or web address], password [facility] 03/30/2013, 9:42 AM  LOS: 1 day   Summary: 79 year old man with history of coronary artery disease, CABG who presented to his primary care physician's office with history of chest pain or shortness of breath over the last week. He had some vomiting in the office. EKG done in the office was "abnormal" and he was sent to the emergency department for further evaluation. Because of abnormal EKG cardiology recommended admission for further evaluation.  Consultants:  None  Procedures:  None   HPI/Subjective: Feels fine now. No chest pain, shortness of breath, abdominal pain, nausea or vomiting. He does report a two-day history of vague left upper quadrant pain and an episode of vomiting. No chest pain. No shortness of breath.  Objective: Filed Vitals:   03/29/13 2127 03/30/13 0038 03/30/13 0209 03/30/13 0557  BP: 119/57  141/63 163/55  Pulse: 77 93 86 80  Temp:   99.6 F (37.6 C) 99.8 F (37.7 C)  TempSrc:   Oral Oral  Resp:  16 20 20   Height:      Weight:    68.085 kg (150 lb 1.6 oz)  SpO2: 96% 93% 97% 96%    Intake/Output Summary (Last 24 hours) at 03/30/13 0942 Last data filed at 03/30/13 0858  Gross per 24 hour  Intake   1200 ml  Output      3 ml  Net   1197 ml     Filed Weights   03/29/13 1835 03/30/13 0557  Weight: 66.679 kg (147 lb) 68.085 kg (150 lb 1.6 oz)    Exam:   Afebrile, vital signs  stable  General: He appears calm and comfortable. Speech fluent and clear.  Cardiovascular: Regular rate and rhythm. No murmur, rub, gallop. No lower edema.  Telemetry sinus rhythm.  Respiratory: Clear to auscultation bilaterally. No wheezes, rales, rhonchi. Normal respiratory effort.  Abdomen: Soft, nontender, nondistended.  Psychiatric:  Grossly normal mood and affect. Speech fluent and appropriate. Somewhat confused.  Data Reviewed:  Potassium 3.3.  BUN and creatinine stable, consistent with known chronic kidney disease stage III  Troponins negative thus far  Stable anemia and thrombocytopenia  Mild elevation of d-dimer  Urinalysis grossly positive   chest x-ray negative  EKG x2 sinus rhythm, right bundle branch block pattern, cannot assess for ischemia. Compared to previous study 11/12/2012: No acute changes are seen, right bundle branch block pattern is old. ST changes appear chronic without significant change.  Scheduled Meds: . allopurinol  100 mg Oral QHS  . aspirin  81 mg Oral q morning - 10a  . carvedilol  12.5 mg Oral BID WC  . enalapril  10 mg Oral q morning - 10a  . enoxaparin (LOVENOX) injection  30 mg Subcutaneous Q24H  . furosemide  40 mg Oral Daily  . pantoprazole  40 mg Oral Daily  . potassium chloride SA  40 mEq Oral q morning - 10a  . simvastatin  40 mg Oral QPM   Continuous Infusions:   Active Problems:   Essential hypertension, benign   Gout   Anemia   Chronic systolic heart failure   CKD (chronic kidney disease) stage 3, GFR 30-59 ml/min   Ischemic cardiomyopathy   Thrombocytopenia, unspecified   Dyspnea   EKG abnormality   Chest discomfort

## 2013-03-30 NOTE — Progress Notes (Signed)
Patient received discharge instructions along with follow up appointments and prescriptions. Patient verbalized understanding of all instructions. Patient was escorted by staff via wheelchair to vehicle. Patient discharged to home in stable condition. 

## 2013-03-31 DIAGNOSIS — R079 Chest pain, unspecified: Secondary | ICD-10-CM | POA: Insufficient documentation

## 2013-04-01 ENCOUNTER — Other Ambulatory Visit: Payer: Self-pay | Admitting: *Deleted

## 2013-04-01 ENCOUNTER — Encounter: Payer: Self-pay | Admitting: *Deleted

## 2013-04-01 DIAGNOSIS — I5022 Chronic systolic (congestive) heart failure: Secondary | ICD-10-CM

## 2013-04-01 DIAGNOSIS — I10 Essential (primary) hypertension: Secondary | ICD-10-CM

## 2013-04-01 LAB — URINE CULTURE: Colony Count: >=100000

## 2013-04-02 ENCOUNTER — Telehealth: Payer: Self-pay | Admitting: Family Medicine

## 2013-04-02 ENCOUNTER — Encounter: Payer: Self-pay | Admitting: Cardiology

## 2013-04-02 ENCOUNTER — Ambulatory Visit (INDEPENDENT_AMBULATORY_CARE_PROVIDER_SITE_OTHER): Payer: Medicare Other | Admitting: Cardiology

## 2013-04-02 ENCOUNTER — Encounter: Payer: Self-pay | Admitting: Family Medicine

## 2013-04-02 ENCOUNTER — Ambulatory Visit (INDEPENDENT_AMBULATORY_CARE_PROVIDER_SITE_OTHER): Payer: Medicare Other | Admitting: Family Medicine

## 2013-04-02 VITALS — BP 158/63 | HR 62 | Ht 70.0 in | Wt 153.0 lb

## 2013-04-02 VITALS — BP 142/62 | Ht 70.0 in | Wt 154.2 lb

## 2013-04-02 DIAGNOSIS — I5022 Chronic systolic (congestive) heart failure: Secondary | ICD-10-CM

## 2013-04-02 DIAGNOSIS — I10 Essential (primary) hypertension: Secondary | ICD-10-CM

## 2013-04-02 DIAGNOSIS — N400 Enlarged prostate without lower urinary tract symptoms: Secondary | ICD-10-CM | POA: Insufficient documentation

## 2013-04-02 DIAGNOSIS — N410 Acute prostatitis: Secondary | ICD-10-CM

## 2013-04-02 DIAGNOSIS — I2510 Atherosclerotic heart disease of native coronary artery without angina pectoris: Secondary | ICD-10-CM

## 2013-04-02 MED ORDER — CIPROFLOXACIN HCL 500 MG PO TABS: 500.0000 mg | ORAL_TABLET | Freq: Two times a day (BID) | ORAL | Status: AC

## 2013-04-02 MED ORDER — TAMSULOSIN HCL 0.4 MG PO CAPS: 0.4000 mg | ORAL_CAPSULE | Freq: Every day | ORAL | Status: DC

## 2013-04-02 NOTE — Assessment & Plan Note (Signed)
Symptomatically stable, recent ECG is reviewed. Plan will be to continue medical therapy and observation.

## 2013-04-02 NOTE — Progress Notes (Signed)
Clinical Summary Mr. Whitmoyer is an 79 y.o.male last seen in June. Recent records reviewed including brief hospitalization just recently with atypical chest pain. ECG abnormal, but largely consistent with prior tracings. Cardiac markers were normal. He was treated medically and discharged home.  Weight is fairly stable overall. He reports no chest pain symptoms, stable dyspnea on exertion, NYHA class II. No significant leg edema. No syncope.  Exercise Myoview from May 2014 demonstrated significant area of scar involving the inferior, inferoseptal, and inferoapical myocardium. No large ischemic zones noted and LVEF was 28%, consistent with recent echocardiogram. After discussions with the patient, we have been treating him conservatively with medical therapy, no plans for invasive testing or a device at this point.  Recent lab work showed BUN 34, creatinine 1.4, hemoglobin 10.1, platelets 88. Chest x-ray showed no acute findings with evidence of atherosclerotic aorta and prior CABG.  Allergies  Allergen Reactions  . Quinidine Other (See Comments)    Chest Pain    Current Outpatient Prescriptions  Medication Sig Dispense Refill  . allopurinol (ZYLOPRIM) 100 MG tablet Take 100 mg by mouth at bedtime.       . ALPRAZolam (XANAX) 0.5 MG tablet Take 1 tablet (0.5 mg total) by mouth 3 (three) times daily as needed for sleep or anxiety.  24 tablet  0  . Ascorbic Acid (VITAMIN C) 100 MG tablet Take 100 mg by mouth daily.      Marland Kitchen aspirin 81 MG tablet Take 81 mg by mouth every morning.       . carvedilol (COREG) 12.5 MG tablet Take 1 tablet (12.5 mg total) by mouth 2 (two) times daily with a meal.  30 tablet  6  . enalapril (VASOTEC) 10 MG tablet Take 10 mg by mouth every morning.       . ferrous sulfate 325 (65 FE) MG tablet Take 325 mg by mouth 2 (two) times daily.       . furosemide (LASIX) 40 MG tablet Take 1 tablet (40 mg total) by mouth daily.  30 tablet  5  . hydrocortisone (ANUSOL-HC) 2.5 %  rectal cream Place 1 application rectally 2 (two) times daily as needed for hemorrhoids.      Marland Kitchen loratadine (CLARITIN) 10 MG tablet Take 10 mg by mouth daily as needed for allergies.      . potassium chloride SA (K-DUR,KLOR-CON) 20 MEQ tablet Take 40 mEq by mouth every morning.      . Pyridoxine HCl (VITAMIN B-6 PO) Take 1 tablet by mouth daily.       . simvastatin (ZOCOR) 40 MG tablet Take 40 mg by mouth every evening.       No current facility-administered medications for this visit.    Past Medical History  Diagnosis Date  . Essential hypertension, benign   . Anemia   . Gout   . ED (erectile dysfunction)   . Mixed hyperlipidemia   . Coronary atherosclerosis of native coronary artery     Multivessel status post CABG  . Impaired fasting glucose   . Insomnia   . CTS (carpal tunnel syndrome)   . Chronic systolic heart failure   . Myocardial infarction 1985  . Ischemic cardiomyopathy     LVEF 30% with severe LVH  . DDD (degenerative disc disease), lumbar   . CHF (congestive heart failure)     Past Surgical History  Procedure Laterality Date  . Appendectomy    . Prostatectomy    . Cholecystectomy    .  Tonsillectomy    . Colonoscopy    . Inguinal hernia repair    . Coronary artery bypass graft  2004    Dr. Cornelius Moras - LIMA to LAD, SVG to circumflex, SVG to RCA    Social History Mr. Woodin reports that he has quit smoking. His smoking use included Cigarettes. He smoked 0.00 packs per day. He does not have any smokeless tobacco history on file. Mr. Obara reports that he does not drink alcohol.  Review of Systems As outlined above.  Physical Examination Filed Vitals:   04/02/13 0907  BP: 158/63  Pulse: 62   Filed Weights   04/02/13 0907  Weight: 153 lb (69.4 kg)    Comfortable at rest.  HEENT: Conjunctiva and lids normal, oropharynx clear.  Neck: Supple, no elevated JVP, no thyromegaly.  Lungs: Diminished but clear to auscultation, nonlabored breathing at rest.    Cardiac: Regular rate and rhythm, no S3 or significant systolic murmur, no pericardial rub.  Abdomen: Soft, nontender,bowel sounds present.  Extremities: No pitting edema, distal pulses 1-2+.    Problem List and Plan   Coronary atherosclerosis of native coronary artery Symptomatically stable, recent ECG is reviewed. Plan will be to continue medical therapy and observation.  Chronic systolic heart failure Managing conservatively as noted above. Weights have been stable overall.  CKD (chronic kidney disease) stage 3, GFR 30-59 ml/min Most recent creatinine 1.4.    Jonelle Sidle, M.D., F.A.C.C.

## 2013-04-02 NOTE — Assessment & Plan Note (Signed)
Managing conservatively as noted above. Weights have been stable overall.

## 2013-04-02 NOTE — Telephone Encounter (Signed)
Urine culture results noted. Resistant to Ceftin.  I have been unable to reach the patient at his cell phone or home phone. I did speak with his daughter Evan Melendez and explained the need to stop Ceftin and take a different antibiotic. She advised prescription should be called to [facility]. She will have someone pick it up and bring it to his house and explain the situation to him.  Rx Cipro 250mg  po BID x6  Brendia Sacks, MD [facility] [phone]

## 2013-04-02 NOTE — Progress Notes (Signed)
Subjective:    Patient ID: Evan Melendez, male    DOB: Dec 18, 1925, 79 y.o.   MRN: [id number]  HPI Here today for swollen prostate gland. Noticed it this morning and it is progressively getting worst.   Patient just the hospital for multiple symptoms. Had atypical chest pain. Ruled out MI. Saw the cardiologist just today. They recommended no further therapy at this time.  Was identified with a urinary tract in the hospital. Infection. Had culture which grew out Enterobacter. Patient now notes increased urinating. Positive dysuria. Up frequently at nighttime.  No fever or chills.  Review of Systems No chest pain no abdominal pain no back pain review systems otherwise negative.    Objective:   Physical Exam  Alert no acute distress. Lungs clear. Heart regular rate and rhythm. HEENT normal. No CVA tenderness. Prostate gland inflamed and boggy.  Urine culture are reviewed and positive for Enterobacter sensitive to Cipro.      Assessment & Plan:  Impression #1 acute prostatitis. #2 recent atypical chest pain ruled out MI. Hospital notes and cardiologist notes reviewed. #3 prostate hypertrophy with worsening symptoms secondary to #1. Plan Cipro twice a day 21 days. Symptomatic care discussed. Flomax each bedtime. Followup regular appointment. Warning signs discussed. 25 minutes spent most in assessment of records and discussion with patient. WSL Results for orders placed during the hospital encounter of 03/29/13  URINE CULTURE      Result Value Range   Specimen Description URINE, CLEAN CATCH     Special Requests NONE     Culture  Setup Time       Value: 03/30/2013 20:22     Performed at [facility] Count       Value: >=100,000 COLONIES/ML     Performed at [facility]   Culture       Value: ENTEROBACTER AEROGENES     Performed at [facility]   Report Status 04/01/2013 FINAL     Organism ID, Bacteria ENTEROBACTER AEROGENES    TROPONIN I   Result Value Range   Troponin I <0.30  <0.30 ng/mL  CBC      Result Value Range   WBC 10.6 (*) 4.0 - 10.5 K/uL   RBC 3.47 (*) 4.22 - 5.81 MIL/uL   Hemoglobin 10.5 (*) 13.0 - 17.0 g/dL   HCT 56.2 (*) 13.0 - 86.5 %   MCV 89.6  78.0 - 100.0 fL   MCH 30.3  26.0 - 34.0 pg   MCHC 33.8  30.0 - 36.0 g/dL   RDW 78.4  69.6 - 29.5 %   Platelets 92 (*) 150 - 400 K/uL  BASIC METABOLIC PANEL      Result Value Range   Sodium 138  135 - 145 mEq/L   Potassium 3.4 (*) 3.5 - 5.1 mEq/L   Chloride 101  96 - 112 mEq/L   CO2 26  19 - 32 mEq/L   Glucose, Bld 198 (*) 70 - 99 mg/dL   BUN 39 (*) 6 - 23 mg/dL   Creatinine, Ser 2.84 (*) 0.50 - 1.35 mg/dL   Calcium 9.1  8.4 - 13.2 mg/dL   GFR calc non Af Amer 31 (*) >90 mL/min   GFR calc Af Amer 36 (*) >90 mL/min  PRO B NATRIURETIC PEPTIDE      Result Value Range   Pro B Natriuretic peptide (BNP) 2578.0 (*) 0 - 450 pg/mL  D-DIMER, QUANTITATIVE      Result Value Range  D-Dimer, Quant 0.61 (*) 0.00 - 0.48 ug/mL-FEU  MAGNESIUM      Result Value Range   Magnesium 2.0  1.5 - 2.5 mg/dL  HEPATIC FUNCTION PANEL      Result Value Range   Total Protein 6.5  6.0 - 8.3 g/dL   Albumin 3.8  3.5 - 5.2 g/dL   AST 19  0 - 37 U/L   ALT 13  0 - 53 U/L   Alkaline Phosphatase 81  39 - 117 U/L   Total Bilirubin 1.1  0.3 - 1.2 mg/dL   Bilirubin, Direct 0.3  0.0 - 0.3 mg/dL   Indirect Bilirubin 0.8  0.3 - 0.9 mg/dL  URINALYSIS, ROUTINE W REFLEX MICROSCOPIC      Result Value Range   Color, Urine YELLOW  YELLOW   APPearance CLEAR  CLEAR   Specific Gravity, Urine >1.030 (*) 1.005 - 1.030   pH 6.0  5.0 - 8.0   Glucose, UA NEGATIVE  NEGATIVE mg/dL   Hgb urine dipstick MODERATE (*) NEGATIVE   Bilirubin Urine SMALL (*) NEGATIVE   Ketones, ur NEGATIVE  NEGATIVE mg/dL   Protein, ur 161 (*) NEGATIVE mg/dL   Urobilinogen, UA 0.2  0.0 - 1.0 mg/dL   Nitrite POSITIVE (*) NEGATIVE   Leukocytes, UA SMALL (*) NEGATIVE  BASIC METABOLIC PANEL      Result Value Range   Sodium  140  135 - 145 mEq/L   Potassium 3.3 (*) 3.5 - 5.1 mEq/L   Chloride 103  96 - 112 mEq/L   CO2 27  19 - 32 mEq/L   Glucose, Bld 139 (*) 70 - 99 mg/dL   BUN 34 (*) 6 - 23 mg/dL   Creatinine, Ser 0.96 (*) 0.50 - 1.35 mg/dL   Calcium 8.5  8.4 - 04.5 mg/dL   GFR calc non Af Amer 42 (*) >90 mL/min   GFR calc Af Amer 49 (*) >90 mL/min  CBC      Result Value Range   WBC 9.0  4.0 - 10.5 K/uL   RBC 3.37 (*) 4.22 - 5.81 MIL/uL   Hemoglobin 10.1 (*) 13.0 - 17.0 g/dL   HCT 40.9 (*) 81.1 - 91.4 %   MCV 89.3  78.0 - 100.0 fL   MCH 30.0  26.0 - 34.0 pg   MCHC 33.6  30.0 - 36.0 g/dL   RDW 78.2  95.6 - 21.3 %   Platelets 88 (*) 150 - 400 K/uL  TROPONIN I      Result Value Range   Troponin I <0.30  <0.30 ng/mL  TROPONIN I      Result Value Range   Troponin I <0.30  <0.30 ng/mL  VITAMIN B12      Result Value Range   Vitamin B-12 266  211 - 911 pg/mL  IRON AND TIBC      Result Value Range   Iron 21 (*) 42 - 135 ug/dL   TIBC 086  578 - 469 ug/dL   Saturation Ratios 6 (*) 20 - 55 %   UIBC 305  125 - 400 ug/dL  FERRITIN      Result Value Range   Ferritin 34  22 - 322 ng/mL  TSH      Result Value Range   TSH 0.583  0.350 - 4.500 uIU/mL  TROPONIN I      Result Value Range   Troponin I <0.30  <0.30 ng/mL  URINE MICROSCOPIC-ADD ON      Result Value Range  Squamous Epithelial / LPF RARE  RARE   WBC, UA TOO NUMEROUS TO COUNT  <3 WBC/hpf   RBC / HPF 7-10  <3 RBC/hpf   Bacteria, UA MANY (*) RARE

## 2013-04-02 NOTE — Assessment & Plan Note (Signed)
Most recent creatinine 1.4. 

## 2013-04-02 NOTE — Patient Instructions (Signed)
Your physician recommends that you schedule a follow-up appointment in 3 MONTHS. 

## 2013-04-04 LAB — BASIC METABOLIC PANEL
BUN: 42 mg/dL — ABNORMAL HIGH (ref 6–23)
CO2: 27 mEq/L (ref 19–32)
Chloride: 104 mEq/L (ref 96–112)
Creat: 1.7 mg/dL — ABNORMAL HIGH (ref 0.50–1.35)
Potassium: 5.4 mEq/L — ABNORMAL HIGH (ref 3.5–5.3)

## 2013-04-08 ENCOUNTER — Encounter: Payer: Self-pay | Admitting: *Deleted

## 2013-04-08 ENCOUNTER — Ambulatory Visit: Payer: MEDICARE | Admitting: Family Medicine

## 2013-04-08 NOTE — Addendum Note (Signed)
Addended by: Derry Lory A on: 04/08/2013 04:32 PM   Modules accepted: Orders

## 2013-04-09 ENCOUNTER — Ambulatory Visit: Payer: Medicare Other | Admitting: Cardiology

## 2013-04-11 NOTE — Progress Notes (Signed)
Pt told

## 2013-04-13 LAB — BASIC METABOLIC PANEL
BUN: 38 mg/dL — ABNORMAL HIGH (ref 6–23)
Creat: 1.71 mg/dL — ABNORMAL HIGH (ref 0.50–1.35)
Glucose, Bld: 107 mg/dL — ABNORMAL HIGH (ref 70–99)
Potassium: 3.5 mEq/L (ref 3.5–5.3)

## 2013-04-15 ENCOUNTER — Encounter: Payer: Self-pay | Admitting: *Deleted

## 2013-04-16 ENCOUNTER — Encounter: Payer: Self-pay | Admitting: Cardiology

## 2013-04-17 ENCOUNTER — Encounter: Payer: Self-pay | Admitting: *Deleted

## 2013-04-17 ENCOUNTER — Other Ambulatory Visit: Payer: Self-pay | Admitting: *Deleted

## 2013-04-17 DIAGNOSIS — I10 Essential (primary) hypertension: Secondary | ICD-10-CM

## 2013-04-25 ENCOUNTER — Encounter: Payer: Self-pay | Admitting: *Deleted

## 2013-04-25 LAB — BASIC METABOLIC PANEL
BUN: 42 mg/dL — ABNORMAL HIGH (ref 6–23)
Calcium: 9.3 mg/dL (ref 8.4–10.5)
Glucose, Bld: 118 mg/dL — ABNORMAL HIGH (ref 70–99)
Potassium: 3.7 mEq/L (ref 3.5–5.3)
Sodium: 143 mEq/L (ref 135–145)

## 2013-04-26 ENCOUNTER — Other Ambulatory Visit: Payer: Self-pay | Admitting: Cardiology

## 2013-04-29 ENCOUNTER — Ambulatory Visit: Payer: MEDICARE | Admitting: Family Medicine

## 2013-04-30 ENCOUNTER — Ambulatory Visit (INDEPENDENT_AMBULATORY_CARE_PROVIDER_SITE_OTHER): Payer: Medicare Other | Admitting: Family Medicine

## 2013-04-30 ENCOUNTER — Encounter: Payer: Self-pay | Admitting: Family Medicine

## 2013-04-30 VITALS — BP 112/58 | Ht 70.0 in | Wt 150.4 lb

## 2013-04-30 DIAGNOSIS — R413 Other amnesia: Secondary | ICD-10-CM

## 2013-04-30 DIAGNOSIS — I5022 Chronic systolic (congestive) heart failure: Secondary | ICD-10-CM

## 2013-04-30 DIAGNOSIS — K644 Residual hemorrhoidal skin tags: Secondary | ICD-10-CM

## 2013-04-30 MED ORDER — HYDROCORTISONE ACETATE 25 MG RE SUPP: 25.0000 mg | Freq: Three times a day (TID) | RECTAL | Status: DC

## 2013-04-30 NOTE — Progress Notes (Signed)
  Subjective:    Patient ID: Evan Melendez, male    DOB: 12-30-1925, 79 y.o.   MRN: [id number]  HPI Patient is here today b/c his hemorrhoids are bothering him. He does have a prescription for them, but he states they are getting worst.  Patient also comes in with other concerns. He arrives with his daughter. I have been concerned by occasional slight confusion. Particularly when the patient a stress. Both the patient admits this is a problem and the daughter admits it's a problem.  At the same time the patient is able to do activities of daily living. The daughter feels safe driving with him. He is lucid and completely oriented.  Daughter somewhat concerned about some short-term memory issues in terms of compliance with medication.  Patient reports no chest pain no shortness breath no abdominal pain no blood per stools.   Review of Systems ROS otherwise negative    Objective:   Physical Exam Alert HEENT normal. Lungs clear. Heart regular in rhythm. Abdomen benign. Rectal exam small irritated hemorrhoid. Nonthrombosed rectal exam otherwise normal.       Assessment & Plan:  Impression irritated hemorrhoid discussed #2 short-term memory issues discussed #3 a motional challenges when upset discussed plan no neurological workup at this time rationale discussed Anusol suppositories when necessary for rectal irritation followup as scheduled. WSL

## 2013-05-06 ENCOUNTER — Telehealth: Payer: Self-pay | Admitting: Family Medicine

## 2013-05-06 ENCOUNTER — Other Ambulatory Visit: Payer: Self-pay

## 2013-05-06 MED ORDER — HYDROCORTISONE 2.5 % RE CREA: 1.0000 "application " | TOPICAL_CREAM | Freq: Two times a day (BID) | RECTAL | Status: DC | PRN

## 2013-05-06 NOTE — Telephone Encounter (Signed)
Notified daughter Elease Hashimoto) that medication refill was sent to [facility].

## 2013-05-06 NOTE — Telephone Encounter (Signed)
Daughter called to state Evan Melendez is out hydrocortisone (ANUSOL-HC) 2.5 % rectal cream.  Hemorrhoids are a little better but are still bothering him.  [facility].  Please call Patient's daughter if our office is able to refill this prescription.

## 2013-05-06 NOTE — Telephone Encounter (Signed)
Ref times three

## 2013-05-07 ENCOUNTER — Encounter: Payer: Self-pay | Admitting: Family Medicine

## 2013-05-07 ENCOUNTER — Ambulatory Visit (INDEPENDENT_AMBULATORY_CARE_PROVIDER_SITE_OTHER): Payer: Medicare Other | Admitting: Family Medicine

## 2013-05-07 VITALS — BP 162/70 | Temp 99.7°F | Ht 70.0 in | Wt 152.4 lb

## 2013-05-07 DIAGNOSIS — R300 Dysuria: Secondary | ICD-10-CM

## 2013-05-07 DIAGNOSIS — N410 Acute prostatitis: Secondary | ICD-10-CM

## 2013-05-07 DIAGNOSIS — R509 Fever, unspecified: Secondary | ICD-10-CM

## 2013-05-07 LAB — POCT URINALYSIS DIPSTICK
Nitrite, UA: POSITIVE
pH, UA: 6

## 2013-05-07 MED ORDER — CEFTRIAXONE SODIUM 1 G IJ SOLR
500.0000 mg | Freq: Once | INTRAMUSCULAR | Status: AC
  Administered 2013-05-07: 500 mg via INTRAMUSCULAR

## 2013-05-07 MED ORDER — CEFTRIAXONE SODIUM 500 MG IJ SOLR: 500.0000 mg | Freq: Once | INTRAMUSCULAR | Status: DC

## 2013-05-07 MED ORDER — CIPROFLOXACIN HCL 500 MG PO TABS: 500.0000 mg | ORAL_TABLET | Freq: Two times a day (BID) | ORAL | Status: AC

## 2013-05-07 NOTE — Progress Notes (Signed)
  Subjective:    Patient ID: Evan Melendez, male    DOB: 17-Jun-1926, 79 y.o.   MRN: [id number]  HPI Patient is here today b/c he states his rectum is burning and irritated. This started yesterday. No bleeding.   Patient has been using Anusol HC for hemorrhoids.  Notes dysuria and increased frequency.  Possible low-grade fever.  No back pain no nausea no vomiting no chills    Review of Systems ROS otherwise negative    Objective:   Physical Exam Alert no apparent distress. HEENT normal. No CVA tenderness. Abdomen benign. Prostate boggy and tender. Hemorrhoids have calm down.  Urinalysis numerous white blood cells noted.       Assessment & Plan:  Impression acute prostatitis with secondary urinary tract infection. Plan Rocephin injection Cipro twice a day. Warning signs discussed. Followup in several weeks as discussed. 25 minutes spent with patient WSL

## 2013-05-08 ENCOUNTER — Ambulatory Visit: Payer: Medicare Other | Admitting: Family Medicine

## 2013-05-09 ENCOUNTER — Other Ambulatory Visit: Payer: Self-pay | Admitting: Family Medicine

## 2013-05-10 ENCOUNTER — Encounter: Payer: Self-pay | Admitting: Family Medicine

## 2013-05-10 ENCOUNTER — Ambulatory Visit (INDEPENDENT_AMBULATORY_CARE_PROVIDER_SITE_OTHER): Payer: Medicare Other | Admitting: Family Medicine

## 2013-05-10 VITALS — BP 140/60 | Temp 98.3°F | Ht 70.0 in | Wt 152.8 lb

## 2013-05-10 DIAGNOSIS — N400 Enlarged prostate without lower urinary tract symptoms: Secondary | ICD-10-CM

## 2013-05-10 DIAGNOSIS — R413 Other amnesia: Secondary | ICD-10-CM

## 2013-05-10 DIAGNOSIS — I10 Essential (primary) hypertension: Secondary | ICD-10-CM

## 2013-05-10 DIAGNOSIS — I5022 Chronic systolic (congestive) heart failure: Secondary | ICD-10-CM

## 2013-05-10 DIAGNOSIS — G609 Hereditary and idiopathic neuropathy, unspecified: Secondary | ICD-10-CM

## 2013-05-10 DIAGNOSIS — D649 Anemia, unspecified: Secondary | ICD-10-CM

## 2013-05-10 DIAGNOSIS — F0391 Unspecified dementia with behavioral disturbance: Secondary | ICD-10-CM

## 2013-05-10 DIAGNOSIS — I2510 Atherosclerotic heart disease of native coronary artery without angina pectoris: Secondary | ICD-10-CM

## 2013-05-10 DIAGNOSIS — E782 Mixed hyperlipidemia: Secondary | ICD-10-CM

## 2013-05-10 DIAGNOSIS — R5381 Other malaise: Secondary | ICD-10-CM

## 2013-05-10 LAB — URINE CULTURE

## 2013-05-10 NOTE — Progress Notes (Signed)
  Subjective:    Patient ID: Evan Melendez, male    DOB: March 23, 1926, 79 y.o.   MRN: [id number]  HPI Patient is here today b/c his hemorrhoids are bothering him again.   Daughter said that patient complained of dizziness and nausea this morning as well.  Ongoing increased urinary frequency. Some burning at times. Some rectal pain. Had positive culture for Enterobacter. Sensitive to current medication.  Family also concerned because it patient is having increased confusion. Increase difficulty memory. Particularly short-term memory. Often worse during periods of stress.  Positive family history of dementia discussed.  Claims compliance with all other medications.    Review of Systems No headache no chest pain decent appetite ROS otherwise negative    Objective:   Physical Exam Alert afebrile currently HEENT normal lungs clear. Heart rare in rhythm. Abdomen benign. Prostate exam not repeated.  Patient quite forgetful oriented x1 but could not remember short-term words x3 on recent analysis.       Assessment & Plan:  Impression 1 UTI clinically improved. Discussed #2 rectal hemorrhoids also improved. Encouraged to maintain suspecting cream but stopped suppositories #3 early dementia see prior note there long discussion held with family. I feel like this is an accurate diagnosis. Plan CT scan of head rationale discussed. Appropriate blood work. Maintain antibiotics. 35 minutes spent with family most in discussion. WSL

## 2013-05-13 ENCOUNTER — Other Ambulatory Visit: Payer: Self-pay | Admitting: *Deleted

## 2013-05-17 ENCOUNTER — Encounter: Payer: Self-pay | Admitting: Family Medicine

## 2013-05-17 ENCOUNTER — Ambulatory Visit (HOSPITAL_COMMUNITY)
Admission: RE | Admit: 2013-05-17 | Discharge: 2013-05-17 | Disposition: A | Discharge status: Home / Self Care | Payer: Medicare Other | Source: Ambulatory Visit | Attending: Family Medicine | Admitting: Family Medicine

## 2013-05-17 DIAGNOSIS — G319 Degenerative disease of nervous system, unspecified: Secondary | ICD-10-CM | POA: Insufficient documentation

## 2013-05-17 DIAGNOSIS — I10 Essential (primary) hypertension: Secondary | ICD-10-CM | POA: Insufficient documentation

## 2013-05-17 DIAGNOSIS — F0391 Unspecified dementia with behavioral disturbance: Secondary | ICD-10-CM | POA: Insufficient documentation

## 2013-05-17 DIAGNOSIS — F03918 Unspecified dementia, unspecified severity, with other behavioral disturbance: Secondary | ICD-10-CM | POA: Insufficient documentation

## 2013-05-17 LAB — CBC
HCT: 27.3 % — ABNORMAL LOW (ref 39.0–52.0)
MCH: 28.7 pg (ref 26.0–34.0)
MCHC: 33.3 g/dL (ref 30.0–36.0)
MCV: 86.1 fL (ref 78.0–100.0)
Platelets: 147 10*3/uL — ABNORMAL LOW (ref 150–400)
RDW: 15.2 % (ref 11.5–15.5)

## 2013-05-20 ENCOUNTER — Other Ambulatory Visit: Payer: Self-pay | Admitting: Family Medicine

## 2013-05-27 ENCOUNTER — Ambulatory Visit (HOSPITAL_COMMUNITY)
Admission: RE | Admit: 2013-05-27 | Discharge: 2013-05-27 | Disposition: A | Discharge status: Home / Self Care | Payer: Medicare Other | Source: Ambulatory Visit | Attending: Family Medicine | Admitting: Family Medicine

## 2013-05-27 ENCOUNTER — Ambulatory Visit (INDEPENDENT_AMBULATORY_CARE_PROVIDER_SITE_OTHER): Payer: Medicare Other | Admitting: Family Medicine

## 2013-05-27 ENCOUNTER — Encounter: Payer: Self-pay | Admitting: Family Medicine

## 2013-05-27 VITALS — BP 112/50 | Ht 70.0 in | Wt 150.4 lb

## 2013-05-27 DIAGNOSIS — M549 Dorsalgia, unspecified: Secondary | ICD-10-CM

## 2013-05-27 DIAGNOSIS — M545 Low back pain, unspecified: Secondary | ICD-10-CM | POA: Insufficient documentation

## 2013-05-27 NOTE — Patient Instructions (Signed)
Take two regular strength tylenol 325s every 6 hours as needed for pain  You can stop the sitz baths

## 2013-05-27 NOTE — Progress Notes (Signed)
  Subjective:    Patient ID: Evan Melendez, male    DOB: 09-03-1925, 79 y.o.   MRN: [id number]  HPI Patient is here today for a f/u visit on possible dementia. Had a CT scan. We discussed results. No acute lesions.  Still claims his hemorrhoids are acting up. Last exam revealed no inflamed hemorrhoids.  Patient reports his prostate hasn't been taking out. On further history it sounds like a transurethral prostatectomy. Patient notes dysuria and increased frequency.  Patient notes no particular chest pain or shortness of breath.  His back still hurts. Primarily low back. Minimal radiation into legs.  Patient's hemorrhoids are still bothering him. He is no longer doing the sitz bath b/c he states it hurts his hips.      Review of Systems No chest pain no shortness breath no abdominal pain no change in urinary or bowel habits. No major swelling the feet. Decent appetite.    Objective:   Physical Exam  Alert no acute distress. HEENT normal. Lungs clear heart regular in rhythm. Abdomen benign. Rectal exam normal. No palpable hemorrhoids. Low back pain for percussion. Negative straight leg raise  Patient is oriented x2. Our former attempt for him to recall 3 words at short-term failed patient having tremendous difficulties with short-term memory    Assessment & Plan:  Impression probable early dementia discussed. With CT scan we'll press on with neuro consult. I saw this and RE been made review of recent notes revealed no neuro consult. #2 back pain likely musculoskeletal x-ray done that showed significant arthritis of the low back. #3 urinary tract infection clinically improved we'll do a followup urine and a few days. #4 hypertension stable. #5 coronary artery disease asymptomatic. #6 inflamed hemorrhoids. Resolved neuro consult. Plan Back x-ray as noted. See above for results. Plan diet exercise discussed. Recheck in office in one month. Easily 35-40 minutes spent with patient most in  discussion

## 2013-05-28 ENCOUNTER — Telehealth: Payer: Self-pay | Admitting: *Deleted

## 2013-05-28 NOTE — Telephone Encounter (Signed)
[facility] medical records to get surgical report on patient. [facility] stated only sugery pt had there was a colonoscopy. I called patient to find out where he went to get prostate surgery because [facility] stated they only did a colonoscopy there and pt stated he didn't think he had a colonoscopy he thought it was prostate surgery.

## 2013-05-29 ENCOUNTER — Telehealth: Payer: Self-pay | Admitting: Family Medicine

## 2013-05-29 NOTE — Telephone Encounter (Signed)
Please initiate referral in system for neurology.  You told me about this yesterday morning, have not seen referral in system yet.  You asked me to work on fairly urgently.  Thanks

## 2013-05-31 ENCOUNTER — Encounter: Payer: Self-pay | Admitting: Family Medicine

## 2013-05-31 ENCOUNTER — Ambulatory Visit (INDEPENDENT_AMBULATORY_CARE_PROVIDER_SITE_OTHER): Payer: Medicare Other | Admitting: Family Medicine

## 2013-05-31 VITALS — BP 148/70 | Ht 70.0 in | Wt 150.6 lb

## 2013-05-31 DIAGNOSIS — R300 Dysuria: Secondary | ICD-10-CM

## 2013-05-31 DIAGNOSIS — M545 Low back pain, unspecified: Secondary | ICD-10-CM

## 2013-05-31 LAB — POCT URINALYSIS DIPSTICK
Spec Grav, UA: 1.01
pH, UA: 5

## 2013-05-31 MED ORDER — HYDROCORTISONE 2.5 % RE CREA: 1.0000 "application " | TOPICAL_CREAM | Freq: Two times a day (BID) | RECTAL | Status: DC | PRN

## 2013-05-31 NOTE — Progress Notes (Signed)
  Subjective:    Patient ID: Evan Melendez, male    DOB: 1926-06-02, 79 y.o.   MRN: [id number]  HPI Patient is here for a f/u appt on his hemorrhoids.   He states he is feeling better and that everything is going well. Patient having significantly less back pain now. Using Anusol cream only on an as-needed basis.  No concerns.    Patient recently treated for protracted urinary tract infection. His is discussed with patient. Complicated because the patient felt that he had had an entire radical prostatectomy a number of years ago. We did a research project and discovered he had only partial simple prostatectomy for hypertrophy resolution.   Review of Systems No chest pain no back pain no abdominal pain no shortness of breath ROS otherwise negative.    Objective:   Physical Exam  Alert no acute distress. Vitals stable. HEENT normal. Lungs clear. Heart regular rate and rhythm.  Urinalysis no white blood cells no red blood cells no bacteria    Assessment & Plan:  Impression 1 hemorrhoids. #2 back pain likely due to degenerative lumbar spine disease discussed improved on Tylenol. #3 early dementia await neurology consult rationale discussed. #4 urinary tract infection resolved Plan followup as scheduled. Compliance with meds discussed. Diet exercise discussed. 25 minutes spent most in discussion. WSL

## 2013-05-31 NOTE — Patient Instructions (Signed)
May take regular strength tylenol 325 mg--up to two three times per day when hurting

## 2013-06-02 DIAGNOSIS — M545 Low back pain: Secondary | ICD-10-CM | POA: Insufficient documentation

## 2013-06-06 ENCOUNTER — Ambulatory Visit (INDEPENDENT_AMBULATORY_CARE_PROVIDER_SITE_OTHER): Payer: Medicare Other | Admitting: Family Medicine

## 2013-06-06 ENCOUNTER — Encounter: Payer: Self-pay | Admitting: Family Medicine

## 2013-06-06 ENCOUNTER — Other Ambulatory Visit: Payer: Self-pay | Admitting: Family Medicine

## 2013-06-06 VITALS — BP 132/70 | Ht 68.5 in | Wt 150.0 lb

## 2013-06-06 DIAGNOSIS — R300 Dysuria: Secondary | ICD-10-CM

## 2013-06-06 DIAGNOSIS — M549 Dorsalgia, unspecified: Secondary | ICD-10-CM

## 2013-06-06 DIAGNOSIS — Z125 Encounter for screening for malignant neoplasm of prostate: Secondary | ICD-10-CM

## 2013-06-06 DIAGNOSIS — G8929 Other chronic pain: Secondary | ICD-10-CM

## 2013-06-06 DIAGNOSIS — F0391 Unspecified dementia with behavioral disturbance: Secondary | ICD-10-CM

## 2013-06-06 LAB — POCT URINALYSIS DIPSTICK
Spec Grav, UA: <=1.005
pH, UA: 5

## 2013-06-06 NOTE — Progress Notes (Signed)
  Subjective:    Patient ID: Evan Melendez, male    DOB: January 30, 1926, 79 y.o.   MRN: [id number]  HPIBurning with urination.  Patient arrives to the office as a walk-in again. He basically is experiencing low back discomfort. His store clear he is a bit difficult because he often has forgetfulness. In fact is in the midst of a workup for dementia at this time. The best I can tell his discomfort is more when sitting on hard surfaces for some period of time. Slight dysuria at most. No increased frequency. Some recent history of urinary tract infection.  We did a research project recently and found that he had a partial prostatectomy many years ago. States he gets up once or twice per night.  Reports low back pain ongoing. See prior notes.   Review of Systems    no fever no chills no vomiting no abdominal pain good appetite claims compliance with medications. Objective:   Physical Exam Alert. Lungs clear. Heart regular in rhythm. No acute distress. Talkative. Abdomen benign. Low back slight tenderness to percussion and sacral region and coccyx region. Rectal exam negative. Prostate normal. No masses.  Urinalysis normal.       Assessment & Plan:  Impression musculoskeletal pain. Advised patient to take 2 Tylenol twice per day. Add ibuprofen as needed. #2 early dementia discussed once again we'll do a little further blood work. #3 recent urinary tract infection with clarification that the patient only had a partial prostatectomy. Therefore with ongoing low-back concerns and some urinary concerns we'll check a PSA. Plan appropriate blood work. Patient strongly encouraged to followup with specialist as scheduled. WSL

## 2013-06-06 NOTE — Patient Instructions (Signed)
Please take two tylenol 325 at least twice per day for your back pain.   Add Ibuprofen two or even three 200 mg tabs if necessary

## 2013-06-07 ENCOUNTER — Ambulatory Visit: Payer: Medicare Other | Admitting: Family Medicine

## 2013-06-07 NOTE — Progress Notes (Signed)
Spoke to patient to let him know his psa normal for age and folic acid fine i e blood work good. Patient verbalized understanding.

## 2013-06-10 ENCOUNTER — Encounter: Payer: Self-pay | Admitting: Neurology

## 2013-06-10 ENCOUNTER — Ambulatory Visit (INDEPENDENT_AMBULATORY_CARE_PROVIDER_SITE_OTHER): Payer: Medicare Other | Admitting: Neurology

## 2013-06-10 ENCOUNTER — Ambulatory Visit: Payer: Medicare Other | Admitting: Family Medicine

## 2013-06-10 VITALS — BP 164/72 | HR 66 | Ht 68.0 in | Wt 150.0 lb

## 2013-06-10 DIAGNOSIS — R413 Other amnesia: Secondary | ICD-10-CM

## 2013-06-10 MED ORDER — DONEPEZIL HCL 5 MG PO TABS: 5.0000 mg | ORAL_TABLET | Freq: Every day | ORAL | Status: DC

## 2013-06-10 NOTE — Patient Instructions (Signed)
Began Aricept (donepezil) at 5 mg at night for one month. If this medication is well-tolerated, please call our office and we will call in a prescription for the 10 mg tablets. Look out for side effects that may include nausea, diarrhea, weight loss, or stomach cramps. This medication will also cause a runny nose, therefore there is no need for allergy medications for this purpose.    

## 2013-06-10 NOTE — Progress Notes (Signed)
Reason for visit: Memory disorder  Evan Melendez is a 79 y.o. male  History of present illness:  Evan Melendez is an 79 year old right-handed white male with a history of problems with memory for an undetermined period time. The patient himself has not indicated that he has any issues with memory. He will misplace things about the house on occasion. The daughter indicates that he will call up to 5 times a day concerning an appointment date, and he will at times repeat himself. The patient reports he does have problems with remembering names of people, but this has been a problem throughout his life. The patient operates a motor vehicle, and he indicates that he has no problems with directions or safety issues. The patient handles his own finances, and keeps up with his medications well. The patient lives alone, and he handles all of his activities of daily living independently. The patient denies any issues with sleep or insomnia, and he denies any excessive daytime drowsiness. The patient has not had any numbness or weakness of the face, arms, or legs. The patient denies any balance issues or problems controlling the bowels or the bladder. The patient has undergone a CT scan of the brain that is relatively unremarkable, with only minimal small vessel disease, and some mild ventriculomegaly. The patient is sent to this office for an evaluation.   Past Medical History  Diagnosis Date  . Essential hypertension, benign   . Anemia   . Gout   . ED (erectile dysfunction)   . Mixed hyperlipidemia   . Coronary atherosclerosis of native coronary artery     Multivessel status post CABG  . Impaired fasting glucose   . Insomnia   . CTS (carpal tunnel syndrome)   . Chronic systolic heart failure   . Myocardial infarction 1985  . Ischemic cardiomyopathy     LVEF 30% with severe LVH  . DDD (degenerative disc disease), lumbar   . CHF (congestive heart failure)   . Hearing difficulty     Past  Surgical History  Procedure Laterality Date  . Appendectomy    . Prostatectomy    . Cholecystectomy    . Tonsillectomy    . Colonoscopy    . Inguinal hernia repair    . Coronary artery bypass graft  2004    Dr. Cornelius Moras - LIMA to LAD, SVG to circumflex, SVG to RCA  . Hand surgery Bilateral     Dupuytren's contracture repair bilaterally, right carpal tunnel syndrome surgery  . Nasal sinus surgery      Family History  Problem Relation Age of Onset  . Dementia Father   . Stroke Father   . Rheum arthritis Mother   . Heart disease Mother     Social history:  reports that he has quit smoking. His smoking use included Cigarettes. He smoked 0.00 packs per day. He has never used smokeless tobacco. He reports that he does not drink alcohol or use illicit drugs.  Medications:  Current Outpatient Prescriptions on File Prior to Visit  Medication Sig Dispense Refill  . allopurinol (ZYLOPRIM) 100 MG tablet Take 100 mg by mouth at bedtime.       . ALPRAZolam (XANAX) 0.5 MG tablet Take 1 tablet (0.5 mg total) by mouth 3 (three) times daily as needed for sleep or anxiety.  24 tablet  0  . Ascorbic Acid (VITAMIN C) 100 MG tablet Take 100 mg by mouth daily.      Marland Kitchen aspirin 81 MG  tablet Take 81 mg by mouth every morning.       . carvedilol (COREG) 12.5 MG tablet TAKE 1 TABLET BY MOUTH TWICE DAILY WITH A MEAL  30 tablet  6  . enalapril (VASOTEC) 10 MG tablet Take 10 mg by mouth every morning.       . etodolac (LODINE) 400 MG tablet TAKE 1 TABLET TWICE DAILY WITH FOOD AS NEEDED FOR PAIN.  40 tablet  2  . ferrous sulfate 325 (65 FE) MG tablet Take 325 mg by mouth 2 (two) times daily.       . furosemide (LASIX) 40 MG tablet Take 1 tablet (40 mg total) by mouth daily.  30 tablet  5  . hydrocortisone (ANUSOL-HC) 2.5 % rectal cream Place 1 application rectally 2 (two) times daily as needed for hemorrhoids.  30 g  3  . loratadine (CLARITIN) 10 MG tablet Take 10 mg by mouth daily as needed for allergies.      .  potassium chloride SA (K-DUR,KLOR-CON) 20 MEQ tablet Take 40 mEq by mouth every morning.      . Pyridoxine HCl (VITAMIN B-6 PO) Take 1 tablet by mouth daily.       . simvastatin (ZOCOR) 40 MG tablet Take 40 mg by mouth every evening.      . tamsulosin (FLOMAX) 0.4 MG CAPS capsule Take 1 capsule (0.4 mg total) by mouth daily after supper.  21 capsule  0   No current facility-administered medications on file prior to visit.      Allergies  Allergen Reactions  . Quinidine Other (See Comments)    Chest Pain    ROS:  Out of a complete 14 system review of symptoms, the patient complains only of the following symptoms, and all other reviewed systems are negative.  Hearing loss, ringing in the ears  Muscle cramps Memory problems, confusion  Blood pressure 164/72, pulse 66, height 5\' 8"  (1.727 m), weight 150 lb (68.04 kg).  Physical Exam  General: The patient is alert and cooperative at the time of the examination.  Head: Pupils are equal, round, and reactive to light. Discs are flat bilaterally.  Neck: The neck is supple, a questionable mild right carotid bruit is noted, none present on the left.   Respiratory: The respiratory examination is clear.  Cardiovascular: The cardiovascular examination reveals a regular rate and rhythm, no obvious murmurs or rubs are noted.  Skin: Extremities are without significant edema.  Neurologic Exam  Mental status: Mini-Mental status examination done today shows a total score of 21/30.   Cranial nerves: Facial symmetry is present. There is good sensation of the face to pinprick and soft touch bilaterally. The strength of the facial muscles and the muscles to head turning and shoulder shrug are normal bilaterally. Speech is well enunciated, no aphasia or dysarthria is noted. Extraocular movements are full. Visual fields are full.  Motor: The motor testing reveals 5 over 5 strength of all 4 extremities. Good symmetric motor tone is noted  throughout.  Sensory: Sensory testing is intact to pinprick, soft touch, vibration sensation, and position sense on all 4 extremities. No evidence of extinction is noted.  Coordination: Cerebellar testing reveals good finger-nose-finger and heel-to-shin bilaterally.  Gait and station: Gait is normal. Tandem gait is slightly unsteady. Romberg is negative. No drift is seen.  Reflexes: Deep tendon reflexes are symmetric and normal bilaterally. Toes are downgoing bilaterally.   Assessment/Plan:   1. Memory disorder  The patient appears to have a mild  dementia at this time. The driving issue will need to be scrutinized closely. The patient lives alone, and over time, he may require increasing supervision. The patient is able to manage independently at this time. The patient will be placed on low-dose Aricept at this time, and he will followup in 6 months. The patient has had blood work done to include a vitamin B12 level, and a TSH that were unremarkable.  Marlan Palau MD 06/10/2013 9:04 PM  [facility] [address] [place], [place] [address]  Phone [phone] Fax [phone]

## 2013-06-21 ENCOUNTER — Telehealth: Payer: Self-pay | Admitting: *Deleted

## 2013-06-21 ENCOUNTER — Other Ambulatory Visit: Payer: Self-pay | Admitting: *Deleted

## 2013-06-21 NOTE — Telephone Encounter (Signed)
Patient dropped off urine at office. Urine came back negative. Patient's family notified.

## 2013-06-28 ENCOUNTER — Ambulatory Visit (INDEPENDENT_AMBULATORY_CARE_PROVIDER_SITE_OTHER): Payer: Medicare Other | Admitting: Family Medicine

## 2013-06-28 ENCOUNTER — Encounter: Payer: Self-pay | Admitting: Family Medicine

## 2013-06-28 VITALS — BP 128/58 | Ht 70.0 in | Wt 153.2 lb

## 2013-06-28 DIAGNOSIS — I10 Essential (primary) hypertension: Secondary | ICD-10-CM

## 2013-06-28 DIAGNOSIS — M545 Low back pain: Secondary | ICD-10-CM

## 2013-06-28 DIAGNOSIS — N400 Enlarged prostate without lower urinary tract symptoms: Secondary | ICD-10-CM

## 2013-06-28 DIAGNOSIS — R413 Other amnesia: Secondary | ICD-10-CM

## 2013-06-28 NOTE — Progress Notes (Signed)
   Subjective:    Patient ID: Evan Melendez, male    DOB: May 06, 1926, 79 y.o.   MRN: [id number]  HPI Patient is here today for a f/u visit.  He states his hemorrhoids are bothering him today. He c/o of loose stools this morning.  Once again, patient presents with ongoing challenges for multiple reasons. He notes ongoing low back pain. An x-ray recently revealed arthritis. No significant ration radiation. Deep ache in nature. Also claims he has "hemorrhoids". This despite the fact of multiple exams every field no such thing. The situation is compromised by his dementia. Which is early in nature. It unfortunately leads him to often forgetting in our discussions of this very matter only days prior.  Accompanied by his daughter. She reports she is encouraged that he seems to be responding to low-dose Aricept. She is keeping a close eye on him. As far as stain in the community and sticking with his medications. Only driving during the day. Only to places where he notes. [place] not [place]    Review of Systems No vomiting no diarrhea no weight loss no chest pain no abdominal pain no change in urinary or bowel habits    Objective:   Physical Exam Alert HEENT normal. Vital stable. Lungs clear. Heart regular in rhythm. Abdomen benign. Prostate normal. No hemorrhoids. Slight external rectal irritation. Some low back pain the 20 percussion     impression 1 mild dementia though creating significant challenges. Once again asked patient to call us rather than walking. Once again reviewed his symptoms which we reviewed for 5 times in the past couple months. #2 low back pain felt to be muscular skeletal. #3 recent prostatitis no evidence now. We have track down records at [facility] patient had a partial prostatectomy. #4 proceed flare of hemorrhoids with no presence of this on exam. Likely secondary to perianal irritation from poor cleansing discussed with both patient and daughter. Easily  35-40 minutes spent discussing all these once again. Plan reschedule followup visit. Warning signs discussed. WSL      Assessment & Plan:  See above

## 2013-07-01 ENCOUNTER — Other Ambulatory Visit: Payer: Self-pay | Admitting: *Deleted

## 2013-07-01 MED ORDER — POTASSIUM CHLORIDE CRYS ER 20 MEQ PO TBCR: 40.0000 meq | EXTENDED_RELEASE_TABLET | Freq: Every morning | ORAL | Status: DC

## 2013-07-05 ENCOUNTER — Ambulatory Visit: Payer: Medicare Other | Admitting: Cardiology

## 2013-07-09 ENCOUNTER — Ambulatory Visit (INDEPENDENT_AMBULATORY_CARE_PROVIDER_SITE_OTHER): Payer: Medicare Other | Admitting: Cardiology

## 2013-07-09 ENCOUNTER — Encounter: Payer: Self-pay | Admitting: Cardiology

## 2013-07-09 VITALS — BP 148/65 | HR 63 | Ht 70.0 in | Wt 150.0 lb

## 2013-07-09 DIAGNOSIS — I2510 Atherosclerotic heart disease of native coronary artery without angina pectoris: Secondary | ICD-10-CM

## 2013-07-09 DIAGNOSIS — I10 Essential (primary) hypertension: Secondary | ICD-10-CM

## 2013-07-09 DIAGNOSIS — E782 Mixed hyperlipidemia: Secondary | ICD-10-CM

## 2013-07-09 NOTE — Assessment & Plan Note (Signed)
He continues on Zocor. 

## 2013-07-09 NOTE — Progress Notes (Signed)
Clinical Summary Mr. Holsworth is an 79 y.o.male last seen in September. He is here with his granddaughter. Fortunately, he reports no  progressive cardiac symptoms, specifically denies chest pain or unusual shortness of breath. No orthopnea or leg swelling. He is now on Aricept for treatment of dementia. Cardiac medicines are stable.  Exercise Myoview from May 2014 demonstrated significant area of scar involving the inferior, inferoseptal, and inferoapical myocardium. No large ischemic zones noted and LVEF was 28%, consistent with recent echocardiogram. After discussions with the patient and family, we have been treating him conservatively with medical therapy, no plans for invasive testing or a device at this point.  Lab work from October showed potassium 3.7, BUN 42, creatinine 1.7 (stable).   Allergies  Allergen Reactions  . Quinidine Other (See Comments)    Chest Pain    Current Outpatient Prescriptions  Medication Sig Dispense Refill  . allopurinol (ZYLOPRIM) 100 MG tablet Take 100 mg by mouth at bedtime.       . ALPRAZolam (XANAX) 0.5 MG tablet Take 1 tablet (0.5 mg total) by mouth 3 (three) times daily as needed for sleep or anxiety.  24 tablet  0  . Ascorbic Acid (VITAMIN C) 100 MG tablet Take 100 mg by mouth daily.      Marland Kitchen aspirin 81 MG tablet Take 81 mg by mouth every morning.       . carvedilol (COREG) 12.5 MG tablet TAKE 1 TABLET BY MOUTH TWICE DAILY WITH A MEAL  30 tablet  6  . donepezil (ARICEPT) 5 MG tablet Take 1 tablet (5 mg total) by mouth at bedtime.  30 tablet  1  . enalapril (VASOTEC) 10 MG tablet Take 10 mg by mouth every morning.       . etodolac (LODINE) 400 MG tablet TAKE 1 TABLET TWICE DAILY WITH FOOD AS NEEDED FOR PAIN.  40 tablet  2  . ferrous sulfate 325 (65 FE) MG tablet Take 325 mg by mouth 2 (two) times daily.       . furosemide (LASIX) 40 MG tablet Take 1 tablet (40 mg total) by mouth daily.  30 tablet  5  . hydrocortisone (ANUSOL-HC) 2.5 % rectal cream  Place 1 application rectally 2 (two) times daily as needed for hemorrhoids.  30 g  3  . potassium chloride SA (K-DUR,KLOR-CON) 20 MEQ tablet Take 2 tablets (40 mEq total) by mouth every morning.  60 tablet  11  . Pyridoxine HCl (VITAMIN B-6 PO) Take 1 tablet by mouth daily.       . simvastatin (ZOCOR) 40 MG tablet Take 40 mg by mouth every evening.      . tamsulosin (FLOMAX) 0.4 MG CAPS capsule Take 1 capsule (0.4 mg total) by mouth daily after supper.  21 capsule  0   No current facility-administered medications for this visit.    Past Medical History  Diagnosis Date  . Essential hypertension, benign   . Anemia   . Gout   . ED (erectile dysfunction)   . Mixed hyperlipidemia   . Coronary atherosclerosis of native coronary artery     Multivessel status post CABG  . Impaired fasting glucose   . Insomnia   . CTS (carpal tunnel syndrome)   . Chronic systolic heart failure   . Myocardial infarction 1985  . Ischemic cardiomyopathy     LVEF 30% with severe LVH  . DDD (degenerative disc disease), lumbar   . CHF (congestive heart failure)   . Hearing difficulty  Past Surgical History  Procedure Laterality Date  . Appendectomy    . Prostatectomy    . Cholecystectomy    . Tonsillectomy    . Colonoscopy    . Inguinal hernia repair    . Coronary artery bypass graft  2004    Dr. Cornelius Moras - LIMA to LAD, SVG to circumflex, SVG to RCA  . Hand surgery Bilateral     Dupuytren's contracture repair bilaterally, right carpal tunnel syndrome surgery  . Nasal sinus surgery      Social History Mr. Korff reports that he has quit smoking. His smoking use included Cigarettes. He smoked 0.00 packs per day. He has never used smokeless tobacco. Mr. Montanye reports that he does not drink alcohol.  Review of Systems No palpitations or syncope. Reports good appetite.  Physical Examination Filed Vitals:   07/09/13 1028  BP: 148/65  Pulse: 63   Filed Weights   07/09/13 1028  Weight: 150 lb  (68.04 kg)    Comfortable at rest.  HEENT: Conjunctiva and lids normal, oropharynx clear.  Neck: Supple, no elevated JVP, no thyromegaly.  Lungs: Diminished but clear to auscultation, nonlabored breathing at rest.  Cardiac: Regular rate and rhythm, no S3 or significant systolic murmur, no pericardial rub.  Abdomen: Soft, nontender,bowel sounds present.  Extremities: No pitting edema, distal pulses 1-2+.    Problem List and Plan   Coronary atherosclerosis of native coronary artery Multivessel disease status post prior CABG with evidence of ischemic cardiomyopathy as outlined above. Plan is to continue conservative treatment, medical therapy and observation for now. Followup arranged.  Essential hypertension, benign Blood pressure is mildly elevated today. Still has home health nurse that checks on him. If trend remains up, further medication adjustments may be needed.  CKD (chronic kidney disease) stage 3, GFR 30-59 ml/min Recent creatinine stable at 1.7.  Mixed hyperlipidemia He continues on Zocor.    Jonelle Sidle, M.D., F.A.C.C.

## 2013-07-09 NOTE — Assessment & Plan Note (Signed)
Multivessel disease status post prior CABG with evidence of ischemic cardiomyopathy as outlined above. Plan is to continue conservative treatment, medical therapy and observation for now. Followup arranged.

## 2013-07-09 NOTE — Patient Instructions (Signed)
Your physician recommends that you schedule a follow-up appointment in: 4 MONTHS. 

## 2013-07-09 NOTE — Assessment & Plan Note (Signed)
Blood pressure is mildly elevated today. Still has home health nurse that checks on him. If trend remains up, further medication adjustments may be needed.

## 2013-07-09 NOTE — Assessment & Plan Note (Signed)
Recent creatinine stable at 1.7.

## 2013-07-16 ENCOUNTER — Encounter: Payer: Self-pay | Admitting: Family Medicine

## 2013-07-16 ENCOUNTER — Ambulatory Visit (INDEPENDENT_AMBULATORY_CARE_PROVIDER_SITE_OTHER): Payer: Medicare Other | Admitting: Family Medicine

## 2013-07-16 VITALS — BP 128/70 | Temp 97.9°F | Ht 68.5 in | Wt 151.0 lb

## 2013-07-16 DIAGNOSIS — R21 Rash and other nonspecific skin eruption: Secondary | ICD-10-CM

## 2013-07-16 NOTE — Progress Notes (Signed)
   Subjective:    Patient ID: Evan Melendez, male    DOB: 1926-04-30, 79 y.o.   MRN: [id number]  HPIRecheck on hemorroids. Please see prior notes. Patient presented multiple times for irritation in the rectum. He calls these hemorrhoids. No abdominal pain has had some low back pain. Complicated by recent presentation of dementia.  Having neck pain on left side. Feels like a muscle cramp. Started a few months ago. Comes and goes sometimes worse when he first wakes up. No radiation of pain down to the arm.    Review of Systems No chest pain no headache no back pain no change in bowel habits no blood in stool ROS otherwise negative    Objective:   Physical Exam  Alert no apparent distress. Lungs clear. Prostrate normal. Perirectal irritation noted no frank hemorrhoids. Neck supple some posterior lateral tenderness on left      Assessment & Plan:  Impression 1 cervical strain. #2 perirectal irritation with perception on the part of the patient that these are "hemorrhoids" plan Anusol a.c. cream 3 times a day long term. I think this patient will need this. Symptomatic care also discussed. Once again 25 minutes spent with patient most involving discussion and reading discussion of chronic issues stretched out considerably by patient's ongoing early dementia WSL

## 2013-07-16 NOTE — Patient Instructions (Addendum)
You have a somewhat irritated rectal area with cracking in the skin  tart using the rectal cream three times per day regularly--rarely some folks have to use it every day of their life and it looks like this will be the case for you.  If you continue to have severe symptoms when I see you again, I'll set you up with a stomach specialist to put a scope in this area to be 100 % sure nothing else is going on   Follow up as originally scheduled

## 2013-07-24 ENCOUNTER — Ambulatory Visit (INDEPENDENT_AMBULATORY_CARE_PROVIDER_SITE_OTHER): Payer: Medicare Other | Admitting: Family Medicine

## 2013-07-24 ENCOUNTER — Encounter: Payer: Self-pay | Admitting: Family Medicine

## 2013-07-24 VITALS — BP 116/60 | Ht 70.0 in | Wt 154.0 lb

## 2013-07-24 DIAGNOSIS — K6289 Other specified diseases of anus and rectum: Secondary | ICD-10-CM

## 2013-07-24 NOTE — Patient Instructions (Signed)
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  For the arthritis pain, take regular strength tylenol (325 mg) two at least twice per day and may take take two even three times per day as necessary.  I'm sorry but we do not operate a walk in clinic like some of the old family doctors, that way we can spend enough time per visit. Please call ahead when you need to see [place].  Since this is causing such ongoing recurrent symptpoms, we are going to scheuled you a visit with a gastroenterologist to see if you need a scope exax

## 2013-07-24 NOTE — Progress Notes (Signed)
   Subjective:    Patient ID: Evan Melendez, male    DOB: 16-Jan-1926, 79 y.o.   MRN: [id number]  HPI Patient is here today b/c his hemorrhoids are irritating him.   No other concerns.   Ongoing low back discomfort. Primarily in the tailbone region.  Patient also notes recurrent rectal irritation. He and his legs feet cream helps some. No obvious blood in stool. Patient continues to maintain that his "hemorrhoids are bothering him" no constipation no chest pain no back pain no abdominal pain no change in urinary habits. No change in bowel habits.  Review of Systems See above    Objective:   Physical Exam Alert vitals reviewed. No apparent distress. H&T normal. Lungs clear. Heart regular in rhythm. Abdomen benign. Some tenderness to deep palp patient of the coccyx. Rectal area some perirectal irritation. No masses. Prostate normal.       Assessment & Plan:  Impression persistent rectal irritation with patient having presented multiple times in the past 6 weeks, generally by walking in, stating that "his hemorrhoids are bothering him" challenging in that the patient has dementia early, and this is impacting upon high recurrence of similar symptomatology, along with difficulty describing it plan Anusol HC cream long-term, to help combat irritation. Tylenol 2-3 times a day for back pain. GI referral to look for other rectal pathology WSL 25 minutes spent most in discussion

## 2013-07-29 ENCOUNTER — Telehealth: Payer: Self-pay | Admitting: Family Medicine

## 2013-07-29 ENCOUNTER — Encounter: Payer: Self-pay | Admitting: Gastroenterology

## 2013-07-29 NOTE — Telephone Encounter (Signed)
Patient stated it was going to be too cold to come in this week and he wanted to wait. Advised patient that he should be checked out if he is having problems but he said he didn't want to come in now.

## 2013-07-29 NOTE — Telephone Encounter (Signed)
appt tom ok, have pt come able to give a urine spec if [employer]

## 2013-07-29 NOTE — Telephone Encounter (Signed)
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 Please advise.

## 2013-08-13 ENCOUNTER — Encounter: Payer: Self-pay | Admitting: Family Medicine

## 2013-08-13 ENCOUNTER — Ambulatory Visit (INDEPENDENT_AMBULATORY_CARE_PROVIDER_SITE_OTHER): Payer: Medicare Other | Admitting: Family Medicine

## 2013-08-13 VITALS — BP 134/60 | Temp 98.9°F | Ht 69.25 in | Wt 155.0 lb

## 2013-08-13 DIAGNOSIS — N410 Acute prostatitis: Secondary | ICD-10-CM

## 2013-08-13 DIAGNOSIS — R300 Dysuria: Secondary | ICD-10-CM

## 2013-08-13 LAB — POCT URINALYSIS DIPSTICK
Protein, UA: 30
Spec Grav, UA: <=1.005
pH, UA: 5

## 2013-08-13 MED ORDER — CIPROFLOXACIN HCL 500 MG PO TABS
500.0000 mg | ORAL_TABLET | Freq: Two times a day (BID) | ORAL | Status: AC
Start: 2013-08-13 — End: 2013-08-23

## 2013-08-13 NOTE — Progress Notes (Signed)
   Subjective:    Patient ID: Evan Melendez, male    DOB: September 16, 1925, 80 y.o.   MRN: [id number]  HPIRecheck on hemorroids.  Urinatingisokay,occasionallyburns  discomvfortinthelowbkattimes Patient due to see a gastroenterologist soon.  Some constipation at times.  Patient presents once again stating "my hemorrhoids are acting up".  No abdominal pain. Review of Systems Possible some increased urinary frequency no fever no upper back pain no chest pain no shortness of breath ROS otherwise negative    Objective:   Physical Exam  Alert pleasant no acute distress. Lungs clear heart regular in rhythm. Abdominal exam benign. Prostate exam potential bogginess and tenderness. Rectal exam reveals no inflamed hemorrhoids.  Urinalysis a couple white blood cells per high-power field otherwise unremarkable      Assessment & Plan:  Impression challenging presentation. This is approximately the fourth or fifth visit in the last couple months in this patient, each time complaining of hemorrhoids. Exam thus far has not always been fruitful. Today patient may have element of prostatitis, but often his prostate feels completely normal when he sense with the symptoms. Due to recurrent nature of her presentation, and the patient's insistence that these feel like hemorrhoids, patient does have GI consultation soon. Also complicated by the patient's dementia. Plan trial of antibiotics. Culture urine. Patient encouraged to followup with GI specialist as scheduled. WSL

## 2013-08-14 ENCOUNTER — Telehealth: Payer: Self-pay | Admitting: Neurology

## 2013-08-14 MED ORDER — DONEPEZIL HCL 5 MG PO TABS: 5.0000 mg | ORAL_TABLET | Freq: Every day | ORAL | Status: DC

## 2013-08-14 NOTE — Telephone Encounter (Signed)
Delrae Rend, Dr. Lance Sell nurse case manager calling to ask Dr. Jannifer Franklin about patient's Aricept dosage as well as needing a new prescription written for that. Please call her back.

## 2013-08-14 NOTE — Telephone Encounter (Signed)
I called back.  Got no answer.  Left message indicating current dose is listed at 5mg .  As well, we will send refills to the pharmacy on file.  Asked her to call back if anything further is needed.

## 2013-08-15 LAB — URINE CULTURE
COLONY COUNT: NO GROWTH
Organism ID, Bacteria: NO GROWTH

## 2013-08-22 ENCOUNTER — Encounter: Payer: Self-pay | Admitting: Gastroenterology

## 2013-08-22 ENCOUNTER — Ambulatory Visit (INDEPENDENT_AMBULATORY_CARE_PROVIDER_SITE_OTHER): Payer: Medicare Other | Admitting: Gastroenterology

## 2013-08-22 VITALS — BP 144/65 | HR 56 | Temp 97.4°F | Wt 153.8 lb

## 2013-08-22 DIAGNOSIS — K649 Unspecified hemorrhoids: Secondary | ICD-10-CM

## 2013-08-22 MED ORDER — LIDOCAINE-HYDROCORTISONE ACE 2-2 % RE KIT: PACK | RECTAL | Status: DC

## 2013-08-22 NOTE — Assessment & Plan Note (Signed)
80 year old male with persistent rectal irritation and discomfort, no rectal bleeding, and no significant findings on external exam. Upon discussion with patient, it appears he may have intermittent constipation which is likely exacerbating his symptoms. Colonoscopy fairly up-to-date per daughter, who is also an Therapist, sports. She does report patient complaining extensively regarding hemorrhoid discomfort. Again, no obvious abnormality on rectal exam.  Will trial Ana-lex BID for 12 days. Refill provided and samples to get started.  Obtain last colonoscopy reports.  Add supplemental fiber daily Miralax on any given day as needed Return in 4 weeks Consider flex sig if persistent issues; patient without any signs of rectal bleeding. ?occult anal fissure? Hopefully with new hemorrhoid cream, he will find some relief.

## 2013-08-22 NOTE — Progress Notes (Signed)
Received outside records.   Appears history of adenomatous polyps, originally in 2003. Most recent colonoscopy in 2010 with tubular adenoma. Recommendations were for surveillance in 2013. He is overdue, but he is also age 80. No rectal bleeding. If rectal discomfort persists, I would recommend one more surveillance colonoscopy. Will discuss this with patient at next appt in 4 weeks.

## 2013-08-22 NOTE — Progress Notes (Signed)
Primary Care Physician:  Rubbie Battiest, MD Primary Gastroenterologist:  Dr. Gala Romney   Chief Complaint  Patient presents with  . Hemorrhoids    HPI:   Evan Melendez presents today at the request of Dr. Wolfgang Phoenix secondary to rectal irritation. He has been seen by Dr. Wolfgang Phoenix on multiple occasions with hemorrhoid issues, challenging secondary to dementia issues. Physical exam without significant findings. No overt signs of GI bleeding.   Daughter present at time of visit. States last colonoscopy several years ago in [place], [place]. Rectum stays irritated. Occasional itching. Right now sore. Feels swollen when sitting. No bright red blood per rectum. No constipation. Denies straining with bowel movements. Denies sitting on toilet for long periods of time. BMs usually once per day. No abdominal pain, discomfort. Suppositories used in the past, sitz baths. States will go a good while doing ok, then hemorrhoids flare up again. Thinks he may have to strain a little here and there.   No supplemental fiber. Used mineral oil recently.   Has history of chronic anemia. Declining further work-up for this.   Past Medical History  Diagnosis Date  . Essential hypertension, benign   . Anemia   . Gout   . ED (erectile dysfunction)   . Mixed hyperlipidemia   . Coronary atherosclerosis of native coronary artery     Multivessel status post CABG  . Impaired fasting glucose   . Insomnia   . CTS (carpal tunnel syndrome)   . Chronic systolic heart failure   . Myocardial infarction 1985  . Ischemic cardiomyopathy     LVEF 30% with severe LVH  . DDD (degenerative disc disease), lumbar   . CHF (congestive heart failure)   . Hearing difficulty     Past Surgical History  Procedure Laterality Date  . Appendectomy    . Prostatectomy    . Cholecystectomy    . Tonsillectomy    . Colonoscopy    . Inguinal hernia repair    . Coronary artery bypass graft  2004    Dr. Roxy Manns - LIMA to LAD, SVG to circumflex,  SVG to RCA  . Hand surgery Bilateral     Dupuytren's contracture repair bilaterally, right carpal tunnel syndrome surgery  . Nasal sinus surgery      Current Outpatient Prescriptions  Medication Sig Dispense Refill  . allopurinol (ZYLOPRIM) 100 MG tablet Take 100 mg by mouth at bedtime.       . ALPRAZolam (XANAX) 0.5 MG tablet Take 1 tablet (0.5 mg total) by mouth 3 (three) times daily as needed for sleep or anxiety.  24 tablet  0  . Ascorbic Acid (VITAMIN C) 100 MG tablet Take 100 mg by mouth daily.      Marland Kitchen aspirin 81 MG tablet Take 81 mg by mouth every morning.       . carvedilol (COREG) 12.5 MG tablet TAKE 1 TABLET BY MOUTH TWICE DAILY WITH A MEAL  30 tablet  6  . ciprofloxacin (CIPRO) 500 MG tablet Take 1 tablet (500 mg total) by mouth 2 (two) times daily.  42 tablet  0  . donepezil (ARICEPT) 5 MG tablet Take 1 tablet (5 mg total) by mouth at bedtime.  30 tablet  3  . enalapril (VASOTEC) 10 MG tablet Take 10 mg by mouth every morning.       . etodolac (LODINE) 400 MG tablet TAKE 1 TABLET TWICE DAILY WITH FOOD AS NEEDED FOR PAIN.  40 tablet  2  .  ferrous sulfate 325 (65 FE) MG tablet Take 325 mg by mouth 2 (two) times daily.       . furosemide (LASIX) 40 MG tablet Take 1 tablet (40 mg total) by mouth daily.  30 tablet  5  . hydrocortisone (ANUSOL-HC) 2.5 % rectal cream Place 1 application rectally 2 (two) times daily as needed for hemorrhoids.  30 g  3  . potassium chloride SA (K-DUR,KLOR-CON) 20 MEQ tablet Take 2 tablets (40 mEq total) by mouth every morning.  60 tablet  11  . Pyridoxine HCl (VITAMIN B-6 PO) Take 1 tablet by mouth daily.       . simvastatin (ZOCOR) 40 MG tablet Take 40 mg by mouth every evening.       No current facility-administered medications for this visit.    Allergies as of 08/22/2013 - Review Complete 08/22/2013  Allergen Reaction Noted  . Quinidine Other (See Comments) 11/01/2012    Family History  Problem Relation Age of Onset  . Dementia Father   .  Stroke Father   . Rheum arthritis Mother   . Heart disease Mother   . Colon cancer Neg Hx     History   Social History  . Marital Status: Widowed    Spouse Name: N/A    Number of Children: N/A  . Years of Education: military   Occupational History  . Not on file.   Social History Main Topics  . Smoking status: Former Smoker    Types: Cigarettes  . Smokeless tobacco: Never Used  . Alcohol Use: No  . Drug Use: No  . Sexual Activity: Not on file   Other Topics Concern  . Not on file   Social History Narrative  . No narrative on file    Review of Systems: Gen: Denies any fever, chills, fatigue, weight loss, lack of appetite.  CV: Denies chest pain, heart palpitations, peripheral edema, syncope.  Resp: Denies shortness of breath at rest or with exertion. Denies wheezing or cough.  GI: see HPI MS: lower back discomfort Derm: Denies rash, itching, dry skin Psych: Denies depression, anxiety, memory loss, and confusion Heme: Denies bruising, bleeding, and enlarged lymph nodes.  Physical Exam: BP 144/65  Pulse 56  Temp(Src) 97.4 F (36.3 C) (Oral)  Wt 153 lb 12.8 oz (69.763 kg) General:   Alert and oriented. Pleasant and cooperative. Well-nourished and well-developed.  Head:  Normocephalic and atraumatic. Eyes:  Without icterus, sclera clear and conjunctiva pink.  Ears:  Normal auditory acuity. Nose:  No deformity, discharge,  or lesions. Mouth:  No deformity or lesions, oral mucosa pink.  Neck:  Supple, without mass or thyromegaly. Lungs:  Clear to auscultation bilaterally. No wheezes, rales, or rhonchi. No distress.  Heart:  S1, S2 present without murmurs appreciated.  Abdomen:  +BS, soft, non-tender and non-distended. No HSM noted. No guarding or rebound. No masses appreciated.  Rectal:  External exam without any evidence of external hemorrhoids, mass, erythema, obvious anal fissure. Internal exam with no palpable mass, abnormalities. No gross blood. Msk:   Symmetrical without gross deformities. Normal posture. Extremities:  Without clubbing or edema. Skin:  Intact without significant lesions or rashes. Cervical Nodes:  No significant cervical adenopathy. Psych:  Alert and cooperative. Normal mood and affect.

## 2013-08-22 NOTE — Progress Notes (Signed)
cc'd to pcp 

## 2013-08-22 NOTE — Patient Instructions (Addendum)
I have sent a hemorrhoid cream called "Ana-lex" to your pharmacy. You will use the applicator provided and insert into your rectum twice a day for 12 days. I have provided samples and sent the prescription to your pharmacy.  Start taking Metamucil or benefiber each day. This can help with your stool formation and hopefully avoid getting constipation or having to strain.   If you do notice that you start to feel constipated, take 1 capful of Miralax as directed on the bottle.   We will see you in 4 weeks!   Please call if you have any worsening of your symptoms at all!  Hemorrhoids Hemorrhoids are swollen veins around the rectum or anus. There are two types of hemorrhoids:   Internal hemorrhoids. These occur in the veins just inside the rectum. They may poke through to the outside and become irritated and painful.  External hemorrhoids. These occur in the veins outside the anus and can be felt as a painful swelling or hard lump near the anus. CAUSES  Pregnancy.   Obesity.   Constipation or diarrhea.   Straining to have a bowel movement.   Sitting for long periods on the toilet.  Heavy lifting or other activity that caused you to strain.  Anal intercourse. SYMPTOMS   Pain.   Anal itching or irritation.   Rectal bleeding.   Fecal leakage.   Anal swelling.   One or more lumps around the anus.  DIAGNOSIS  Your caregiver may be able to diagnose hemorrhoids by visual examination. Other examinations or tests that may be performed include:   Examination of the rectal area with a gloved hand (digital rectal exam).   Examination of anal canal using a small tube (scope).   A blood test if you have lost a significant amount of blood.  A test to look inside the colon (sigmoidoscopy or colonoscopy). TREATMENT Most hemorrhoids can be treated at home. However, if symptoms do not seem to be getting better or if you have a lot of rectal bleeding, your caregiver may  perform a procedure to help make the hemorrhoids get smaller or remove them completely. Possible treatments include:   Placing a rubber band at the base of the hemorrhoid to cut off the circulation (rubber band ligation).   Injecting a chemical to shrink the hemorrhoid (sclerotherapy).   Using a tool to burn the hemorrhoid (infrared light therapy).   Surgically removing the hemorrhoid (hemorrhoidectomy).   Stapling the hemorrhoid to block blood flow to the tissue (hemorrhoid stapling).  HOME CARE INSTRUCTIONS   Eat foods with fiber, such as whole grains, beans, nuts, fruits, and vegetables. Ask your doctor about taking products with added fiber in them (fibersupplements).  Increase fluid intake. Drink enough water and fluids to keep your urine clear or pale yellow.   Exercise regularly.   Go to the bathroom when you have the urge to have a bowel movement. Do not wait.   Avoid straining to have bowel movements.   Keep the anal area dry and clean. Use wet toilet paper or moist towelettes after a bowel movement.   Medicated creams and suppositories may be used or applied as directed.   Only take over-the-counter or prescription medicines as directed by your caregiver.   Take warm sitz baths for 15 20 minutes, 3 4 times a day to ease pain and discomfort.   Place ice packs on the hemorrhoids if they are tender and swollen. Using ice packs between sitz baths may be helpful.  Put ice in a plastic bag.   Place a towel between your skin and the bag.   Leave the ice on for 15 20 minutes, 3 4 times a day.   Do not use a donut-shaped pillow or sit on the toilet for long periods. This increases blood pooling and pain.  SEEK MEDICAL CARE IF:  You have increasing pain and swelling that is not controlled by treatment or medicine.  You have uncontrolled bleeding.  You have difficulty or you are unable to have a bowel movement.  You have pain or inflammation outside  the area of the hemorrhoids. MAKE SURE YOU:  Understand these instructions.  Will watch your condition.  Will get help right away if you are not doing well or get worse. Document Released: 07/08/2000 Document Revised: 06/27/2012 Document Reviewed: 05/15/2012 [facility] Patient Information 2014 [place].

## 2013-08-27 ENCOUNTER — Ambulatory Visit: Payer: Medicare Other | Admitting: Family Medicine

## 2013-08-30 ENCOUNTER — Encounter: Payer: Self-pay | Admitting: Family Medicine

## 2013-08-30 ENCOUNTER — Ambulatory Visit (INDEPENDENT_AMBULATORY_CARE_PROVIDER_SITE_OTHER): Payer: Medicare Other | Admitting: Family Medicine

## 2013-08-30 VITALS — BP 110/70 | Ht 69.25 in | Wt 153.2 lb

## 2013-08-30 DIAGNOSIS — R413 Other amnesia: Secondary | ICD-10-CM

## 2013-08-30 DIAGNOSIS — N400 Enlarged prostate without lower urinary tract symptoms: Secondary | ICD-10-CM

## 2013-08-30 DIAGNOSIS — I2510 Atherosclerotic heart disease of native coronary artery without angina pectoris: Secondary | ICD-10-CM

## 2013-08-30 DIAGNOSIS — M545 Low back pain, unspecified: Secondary | ICD-10-CM

## 2013-08-30 DIAGNOSIS — I5022 Chronic systolic (congestive) heart failure: Secondary | ICD-10-CM

## 2013-08-30 DIAGNOSIS — I10 Essential (primary) hypertension: Secondary | ICD-10-CM

## 2013-08-30 NOTE — Patient Instructions (Signed)
Increase tylenol to 500 mg three times per day

## 2013-08-30 NOTE — Progress Notes (Signed)
   Subjective:    Patient ID: Evan Melendez, male    DOB: [birth year], 80 y.o.   MRN: [id number]  HPI Patient is here today for a follow up visit. He states he is still having problems with his hemorrhoids and would like this checked. Patient stew with his daughter for a rather protracted discussion.  History of dementia. Currently followed by the neurologist. Relatively mild in nature. Daughter reports overall appears to be doing better with this.  On Cipro currently for prostatitis. Interestingly daughter notes some improvement in mental functioning with this.  Patient returns once again stating he thinks his hemorrhoids may be acting up. Just on Friday he saw a specialist to evaluate in this area and told him this was likely not the case.  Patient notes low back discomfort. Sharp in nature worse with pressure. X-rays in the past showed degenerative changes.  No chest pain no back pain no abdominal pain.  GI note is reviewed in the presence of family. They did track down last colonoscopy. This was 2010. He had a tubular adenoma and they had recommended repeat 2013. They will likely advise this at the next visit. Discussed with patient today.   No other concerns at this time.      Review of Systems    no chest pain no back pain no bowel pain no change in bowel habits no blood in stool ROS otherwise negative Objective:   Physical Exam Alert no apparent distress. Oriented today. Lungs clear. Heart regular in rhythm. Low spine tender percussion. Abdomen benign. Rectal exam normal. Prostate normal.       Assessment & Plan:  Impression 1 resolving prostatitis #2 arthritis low back. #3 dementia ongoing. He then leads to difficulties the patient continuously walking in with chronic concerns. Discussed once again. #4 GI consult discussed. They may recommend sigmoidoscopy and if they do I would support this. Plan increase Tylenol to 3 times a day. Dietary measures discussed. Followup  with specialist. Recheck here in 3 months. Continue to followup with neurologist for back neurology concerns. WSL 35 minutes spent most in discussion. WSL

## 2013-09-04 ENCOUNTER — Ambulatory Visit: Payer: Medicare Other | Admitting: Family Medicine

## 2013-09-09 ENCOUNTER — Telehealth: Payer: Self-pay | Admitting: Family Medicine

## 2013-09-09 ENCOUNTER — Ambulatory Visit: Payer: Medicare Other | Admitting: Family Medicine

## 2013-09-09 MED ORDER — ALLOPURINOL 100 MG PO TABS: 100.0000 mg | ORAL_TABLET | Freq: Every day | ORAL | Status: DC

## 2013-09-09 NOTE — Telephone Encounter (Signed)
Patient needs Rx for allopurinol (ZYLOPRIM) 100 MG tablet ASAP, patient is at [facility]

## 2013-09-09 NOTE — Telephone Encounter (Signed)
Rx sent electronically to pharmacy. Patient notified. 

## 2013-09-13 ENCOUNTER — Other Ambulatory Visit: Payer: Self-pay

## 2013-09-13 MED ORDER — HYDROCORTISONE 2.5 % RE CREA: 1.0000 "application " | TOPICAL_CREAM | Freq: Two times a day (BID) | RECTAL | Status: DC | PRN

## 2013-09-18 ENCOUNTER — Ambulatory Visit (INDEPENDENT_AMBULATORY_CARE_PROVIDER_SITE_OTHER): Payer: Medicare Other | Admitting: Gastroenterology

## 2013-09-18 ENCOUNTER — Encounter: Payer: Self-pay | Admitting: Gastroenterology

## 2013-09-18 VITALS — BP 142/70 | HR 55 | Temp 98.4°F | Wt 151.8 lb

## 2013-09-18 DIAGNOSIS — K6289 Other specified diseases of anus and rectum: Secondary | ICD-10-CM

## 2013-09-18 DIAGNOSIS — I2510 Atherosclerotic heart disease of native coronary artery without angina pectoris: Secondary | ICD-10-CM

## 2013-09-18 DIAGNOSIS — D649 Anemia, unspecified: Secondary | ICD-10-CM

## 2013-09-18 MED ORDER — LIDOCAINE-HYDROCORTISONE ACE 2-2 % RE KIT: PACK | RECTAL | Status: DC

## 2013-09-18 MED ORDER — PEG 3350-KCL-NA BICARB-NACL 420 G PO SOLR: 4000.0000 mL | ORAL | Status: DC

## 2013-09-18 NOTE — Progress Notes (Signed)
Referring Provider: Mikey Kirschner, MD Primary Care Physician:  Rubbie Battiest, MD Primary GI: Dr. Gala Romney   Chief Complaint  Patient presents with  . Follow-up    HPI:   Evan Melendez returns today in follow-up for hemorrhoids, rectal irritation. Daughter is a Marine scientist, lives in [place]. Present for today's appointment. Last seen end of Jan 2015 by myself, prescribed Ana-lex. Question of internal hemorrhoids, possible anal fissure. He has done well with the cream but still notes intermittent rectal discomfort. Denies constipation, straining. No rectal bleeding. Actually due for surveillance colonoscopy, as he has a history of adenomatous polyps with last colonoscopy in 2010. Symptoms have been chronic, with multiple visits to primary care due to irritation prior to seeing GI. Both patient and daughter interested in pursuing one last lower GI evaluation to lay issue to rest.   Past Medical History  Diagnosis Date  . Essential hypertension, benign   . Anemia   . Gout   . ED (erectile dysfunction)   . Mixed hyperlipidemia   . Coronary atherosclerosis of native coronary artery     Multivessel status post CABG  . Impaired fasting glucose   . Insomnia   . CTS (carpal tunnel syndrome)   . Chronic systolic heart failure   . Myocardial infarction 1985  . Ischemic cardiomyopathy     LVEF 30% with severe LVH  . DDD (degenerative disc disease), lumbar   . CHF (congestive heart failure)   . Hearing difficulty     Past Surgical History  Procedure Laterality Date  . Appendectomy    . Prostatectomy    . Cholecystectomy    . Tonsillectomy    . Colonoscopy    . Inguinal hernia repair    . Coronary artery bypass graft  2004    Dr. Roxy Manns - LIMA to LAD, SVG to circumflex, SVG to RCA  . Hand surgery Bilateral     Dupuytren's contracture repair bilaterally, right carpal tunnel syndrome surgery  . Nasal sinus surgery    . Colonoscopy  2003    Dr. Lindalou Hose: adenomatous polyp  .  Colonoscopy  2010    Dr. Lindalou Hose: tubular adenoma, hyperplastic polyp. Surveillance requested for 2013    Current Outpatient Prescriptions  Medication Sig Dispense Refill  . allopurinol (ZYLOPRIM) 100 MG tablet Take 1 tablet (100 mg total) by mouth at bedtime.  30 tablet  2  . ALPRAZolam (XANAX) 0.5 MG tablet Take 1 tablet (0.5 mg total) by mouth 3 (three) times daily as needed for sleep or anxiety.  24 tablet  0  . Ascorbic Acid (VITAMIN C) 100 MG tablet Take 100 mg by mouth daily.      Marland Kitchen aspirin 81 MG tablet Take 81 mg by mouth every morning.       . carvedilol (COREG) 12.5 MG tablet TAKE 1 TABLET BY MOUTH TWICE DAILY WITH A MEAL  30 tablet  6  . donepezil (ARICEPT) 5 MG tablet Take 1 tablet (5 mg total) by mouth at bedtime.  30 tablet  3  . enalapril (VASOTEC) 10 MG tablet Take 10 mg by mouth every morning.       . etodolac (LODINE) 400 MG tablet TAKE 1 TABLET TWICE DAILY WITH FOOD AS NEEDED FOR PAIN.  40 tablet  2  . ferrous sulfate 325 (65 FE) MG tablet Take 325 mg by mouth 2 (two) times daily.       . furosemide (LASIX) 40 MG tablet Take 1 tablet (40 mg total)  by mouth daily.  30 tablet  5  . potassium chloride SA (K-DUR,KLOR-CON) 20 MEQ tablet Take 2 tablets (40 mEq total) by mouth every morning.  60 tablet  11  . Pyridoxine HCl (VITAMIN B-6 PO) Take 1 tablet by mouth daily.       . simvastatin (ZOCOR) 40 MG tablet Take 40 mg by mouth every evening.      . hydrocortisone (ANUSOL-HC) 2.5 % rectal cream Place 1 application rectally 2 (two) times daily as needed for hemorrhoids.  30 g  prn  . Lidocaine-Hydrocortisone Ace (ANA-LEX) 2-2 % KIT Place cream in applicator and insert in rectum twice a day for 12 days.  24 each  1   No current facility-administered medications for this visit.    Allergies as of 09/18/2013 - Review Complete 09/18/2013  Allergen Reaction Noted  . Quinidine Other (See Comments) 11/01/2012    Family History  Problem Relation Age of Onset  . Dementia Father    . Stroke Father   . Rheum arthritis Mother   . Heart disease Mother   . Colon cancer Neg Hx     History   Social History  . Marital Status: Widowed    Spouse Name: N/A    Number of Children: N/A  . Years of Education: [employer]   Social History Main Topics  . Smoking status: Former Smoker    Types: Cigarettes  . Smokeless tobacco: Never Used  . Alcohol Use: No  . Drug Use: No  . Sexual Activity: None   Other Topics Concern  . None   Social History Narrative  . None    Review of Systems: As mentioned in HPI  Physical Exam: BP 142/70  Pulse 55  Temp(Src) 98.4 F (36.9 C) (Oral)  Wt 151 lb 12.8 oz (68.856 kg) General:   Alert and oriented. No distress noted. Pleasant and cooperative.  Head:  Normocephalic and atraumatic. Eyes:  Conjuctiva clear without scleral icterus. Mouth:  Oral mucosa pink and moist. Good dentition. No lesions. Neck:  Supple, without mass or thyromegaly. Heart:  S1, S2 present without murmurs, rubs, or gallops. Regular rate and rhythm. Abdomen:  +BS, soft, non-tender and non-distended. No rebound or guarding. No HSM or masses noted. Msk:  Symmetrical without gross deformities. Normal posture. Extremities:  Without edema. Neurologic:  Alert and  oriented x4;  grossly normal neurologically. Skin:  Intact without significant lesions or rashes. Psych:  Alert and cooperative. Normal mood and affect.

## 2013-09-18 NOTE — Patient Instructions (Signed)
I have sent the hemorrhoid cream to your pharmacy again.   We have scheduled you for a colonoscopy with Dr. Gala Romney in the near future. Further recommendations to follow.  Enjoy the snow and be safe!

## 2013-09-23 DIAGNOSIS — K6289 Other specified diseases of anus and rectum: Secondary | ICD-10-CM | POA: Insufficient documentation

## 2013-09-23 NOTE — Assessment & Plan Note (Signed)
80 year old with rectal discomfort but no obvious rectal abnormalities noted on last exam, with symptom improvement with Ana-lex. However he still notes recurrence of symptoms in absence of cream; no rectal bleeding. Overdue for surveillance colonoscopy due to history of adenomatous polyps. Although he is 54, he is in fairly good health and active. Both he and his daughter would like to proceed with a surveillance colonoscopy at this time.   Proceed with TCS with Dr. Gala Romney in near future: the risks, benefits, and alternatives have been discussed with the patient in detail. The patient states understanding and desires to proceed. Continue another round of Ana-lex

## 2013-09-23 NOTE — Assessment & Plan Note (Signed)
Chronic, multifactorial in setting of renal disease. Patient and daughter declined any further work-up. Stable.

## 2013-09-24 ENCOUNTER — Telehealth: Payer: Self-pay | Admitting: Internal Medicine

## 2013-09-24 ENCOUNTER — Other Ambulatory Visit: Payer: Self-pay | Admitting: Cardiology

## 2013-09-24 NOTE — Telephone Encounter (Signed)
Pt's daughter, [name], called regarding her father. He was seen by AS on 2/25 and wants to speak with someone about the cream medication. Please call her back at [phone]

## 2013-09-24 NOTE — Progress Notes (Signed)
cc'd to pcp 

## 2013-09-24 NOTE — Telephone Encounter (Signed)
Spoke with daughter. Patient had already filled the trilyte prescription and completed one glass. Requesting that this be sent to the pharmacy again with a fill date of 3/21.   Also noted that the Analex would be 300$.   I spoke with Laurey Arrow, pharmacist at [facility]. He is able to compound this and bring the cost down to 60$, placing it in a "rectal rocket" suppository form. I gave him a verbal to do this and disp#8, take one each evening. He has also placed the trilyte rx in a queue to be filled on 3/21.

## 2013-09-26 ENCOUNTER — Encounter (HOSPITAL_COMMUNITY): Payer: Self-pay | Admitting: Pharmacy Technician

## 2013-10-09 ENCOUNTER — Encounter: Payer: Self-pay | Admitting: Family Medicine

## 2013-10-09 ENCOUNTER — Ambulatory Visit (INDEPENDENT_AMBULATORY_CARE_PROVIDER_SITE_OTHER): Payer: Medicare Other | Admitting: Family Medicine

## 2013-10-09 VITALS — BP 138/70 | Temp 98.5°F | Ht 69.25 in | Wt 157.0 lb

## 2013-10-09 DIAGNOSIS — I10 Essential (primary) hypertension: Secondary | ICD-10-CM

## 2013-10-09 DIAGNOSIS — N400 Enlarged prostate without lower urinary tract symptoms: Secondary | ICD-10-CM

## 2013-10-09 DIAGNOSIS — Z79899 Other long term (current) drug therapy: Secondary | ICD-10-CM

## 2013-10-09 DIAGNOSIS — N183 Chronic kidney disease, stage 3 unspecified: Secondary | ICD-10-CM

## 2013-10-09 DIAGNOSIS — I2510 Atherosclerotic heart disease of native coronary artery without angina pectoris: Secondary | ICD-10-CM

## 2013-10-09 DIAGNOSIS — M545 Low back pain, unspecified: Secondary | ICD-10-CM

## 2013-10-09 DIAGNOSIS — E782 Mixed hyperlipidemia: Secondary | ICD-10-CM

## 2013-10-09 DIAGNOSIS — R300 Dysuria: Secondary | ICD-10-CM

## 2013-10-09 DIAGNOSIS — I5022 Chronic systolic (congestive) heart failure: Secondary | ICD-10-CM

## 2013-10-09 LAB — POCT URINALYSIS DIPSTICK
PH UA: 5
Spec Grav, UA: 1.01

## 2013-10-09 NOTE — Progress Notes (Signed)
   Subjective:    Patient ID: Evan Melendez, male    DOB: 04-01-1926, 80 y.o.   MRN: [id number]  HPIPatient states his prostate is sore. Patient this with discomfort. He points towards his mid sacral region is the main area of discomfort. No constipation. No dysuria. No increased frequency. No increased nocturia. No fever or chills.  History of coronary artery disease. Compliant with medications. No obvious side effects.   Compliant with blood pressure medication. Tries not to Miss a dose. Watching salt intake.  Due to have colonoscopy next week to 2 ongoing persistent pelvic and rectal symptomatology.   Review of Systems No chest pain no back pain no abdominal pain no change in bowel habits no blood in stool ROS otherwise negative    Objective:   Physical Exam  Alert no apparent distress. Lungs clear. Heart regular in rhythm. H&T normal. Abdomen benign. Prostate palpable exam within normal limits no masses scrotum normal testicles normal hips good range of motion.      Assessment & Plan:  Impression ongoing nonspecific symptomatology. I believe this is a combination of factors including lumbar arthritis, perirectal irritation, constipation, and frankly dementia. We will await GI colonoscopy for further delineation of colon status. No evidence of urinary tract infection this time. Urine unremarkable. #2 hyperlipidemia status uncertain. #3 hypertension good control. #4 coronary artery disease asymptomatic plan patient encouraged to get colonoscopy. Appropriate blood work. No further antibiotics at this time. Culture urine. WSL

## 2013-10-10 LAB — BASIC METABOLIC PANEL
BUN: 38 mg/dL — ABNORMAL HIGH (ref 6–23)
CO2: 28 meq/L (ref 19–32)
Calcium: 9.1 mg/dL (ref 8.4–10.5)
Chloride: 107 mEq/L (ref 96–112)
Creat: 1.65 mg/dL — ABNORMAL HIGH (ref 0.50–1.35)
Glucose, Bld: 97 mg/dL (ref 70–99)
POTASSIUM: 4 meq/L (ref 3.5–5.3)
SODIUM: 144 meq/L (ref 135–145)

## 2013-10-10 LAB — HEPATIC FUNCTION PANEL
ALT: 12 U/L (ref 0–53)
AST: 19 U/L (ref 0–37)
Albumin: 4.1 g/dL (ref 3.5–5.2)
Alkaline Phosphatase: 73 U/L (ref 39–117)
BILIRUBIN INDIRECT: 0.5 mg/dL (ref 0.2–1.2)
Bilirubin, Direct: 0.1 mg/dL (ref 0.0–0.3)
Total Bilirubin: 0.6 mg/dL (ref 0.2–1.2)
Total Protein: 5.9 g/dL — ABNORMAL LOW (ref 6.0–8.3)

## 2013-10-10 LAB — LIPID PANEL
Cholesterol: 136 mg/dL (ref 0–200)
HDL: 44 mg/dL (ref >39)
LDL Cholesterol: 63 mg/dL (ref 0–99)
TRIGLYCERIDES: 143 mg/dL (ref <150)
Total CHOL/HDL Ratio: 3.1 Ratio
VLDL: 29 mg/dL (ref 0–40)

## 2013-10-11 LAB — URINE CULTURE
Colony Count: NO GROWTH
Organism ID, Bacteria: NO GROWTH

## 2013-10-14 ENCOUNTER — Ambulatory Visit (HOSPITAL_COMMUNITY)
Admission: RE | Admit: 2013-10-14 | Discharge: 2013-10-14 | Disposition: A | Discharge status: Home / Self Care | Payer: Medicare Other | Source: Ambulatory Visit | Attending: Internal Medicine | Admitting: Internal Medicine

## 2013-10-14 ENCOUNTER — Encounter (HOSPITAL_COMMUNITY): Payer: Self-pay

## 2013-10-14 ENCOUNTER — Encounter (HOSPITAL_COMMUNITY)
Admission: RE | Disposition: A | Discharge status: Home / Self Care | Payer: Self-pay | Source: Ambulatory Visit | Attending: Internal Medicine

## 2013-10-14 DIAGNOSIS — E782 Mixed hyperlipidemia: Secondary | ICD-10-CM | POA: Insufficient documentation

## 2013-10-14 DIAGNOSIS — Z860100 Personal history of colon polyps, unspecified: Secondary | ICD-10-CM | POA: Insufficient documentation

## 2013-10-14 DIAGNOSIS — Z7982 Long term (current) use of aspirin: Secondary | ICD-10-CM | POA: Insufficient documentation

## 2013-10-14 DIAGNOSIS — D126 Benign neoplasm of colon, unspecified: Secondary | ICD-10-CM

## 2013-10-14 DIAGNOSIS — D129 Benign neoplasm of anus and anal canal: Principal | ICD-10-CM

## 2013-10-14 DIAGNOSIS — D128 Benign neoplasm of rectum: Secondary | ICD-10-CM | POA: Insufficient documentation

## 2013-10-14 DIAGNOSIS — K648 Other hemorrhoids: Secondary | ICD-10-CM | POA: Insufficient documentation

## 2013-10-14 DIAGNOSIS — Z951 Presence of aortocoronary bypass graft: Secondary | ICD-10-CM | POA: Insufficient documentation

## 2013-10-14 DIAGNOSIS — I10 Essential (primary) hypertension: Secondary | ICD-10-CM | POA: Insufficient documentation

## 2013-10-14 DIAGNOSIS — Z87891 Personal history of nicotine dependence: Secondary | ICD-10-CM | POA: Insufficient documentation

## 2013-10-14 DIAGNOSIS — K6289 Other specified diseases of anus and rectum: Secondary | ICD-10-CM

## 2013-10-14 DIAGNOSIS — R7301 Impaired fasting glucose: Secondary | ICD-10-CM | POA: Insufficient documentation

## 2013-10-14 HISTORY — DX: Unspecified dementia, unspecified severity, without behavioral disturbance, psychotic disturbance, mood disturbance, and anxiety: F03.90

## 2013-10-14 HISTORY — PX: COLONOSCOPY: SHX5424

## 2013-10-14 SURGERY — COLONOSCOPY
Anesthesia: Moderate Sedation

## 2013-10-14 MED ORDER — SODIUM CHLORIDE 0.9 % IV SOLN
INTRAVENOUS | Status: DC
  Administered 2013-10-14: 07:00:00 via INTRAVENOUS

## 2013-10-14 MED ORDER — MEPERIDINE HCL 100 MG/ML IJ SOLN
INTRAMUSCULAR | Status: DC | PRN
  Administered 2013-10-14: 25 mg via INTRAVENOUS

## 2013-10-14 MED ORDER — SIMETHICONE 40 MG/0.6ML PO SUSP
ORAL | Status: DC | PRN
  Administered 2013-10-14: 08:00:00

## 2013-10-14 MED ORDER — ONDANSETRON HCL 4 MG/2ML IJ SOLN
INTRAMUSCULAR | Status: AC
  Filled 2013-10-14: qty 2

## 2013-10-14 MED ORDER — MEPERIDINE HCL 100 MG/ML IJ SOLN
INTRAMUSCULAR | Status: AC
  Filled 2013-10-14: qty 2

## 2013-10-14 MED ORDER — MIDAZOLAM HCL 5 MG/5ML IJ SOLN
INTRAMUSCULAR | Status: AC
  Filled 2013-10-14: qty 10

## 2013-10-14 MED ORDER — ONDANSETRON HCL 4 MG/2ML IJ SOLN
INTRAMUSCULAR | Status: DC | PRN
Start: 2013-10-14 — End: 2013-10-14
  Administered 2013-10-14: 4 mg via INTRAVENOUS

## 2013-10-14 MED ORDER — MIDAZOLAM HCL 5 MG/5ML IJ SOLN
INTRAMUSCULAR | Status: DC | PRN
  Administered 2013-10-14: 1 mg via INTRAVENOUS
  Administered 2013-10-14: 0.5 mg via INTRAVENOUS

## 2013-10-14 NOTE — Op Note (Signed)
[facility] [address] [place], [address]   COLONOSCOPY PROCEDURE REPORT  PATIENT: Evan Melendez, Evan Melendez.  MR#:         [id number] BIRTHDATE: April 13, 1926 , 87  yrs. old GENDER: Male ENDOSCOPIST: R.  Garfield Cornea, MD FACP FACG REFERRED BY:  Rosemary Holms, M.D. PROCEDURE DATE:  10/14/2013 PROCEDURE:     Ileocolonoscopy with multiple snare polypectomies  INDICATIONS: History of colonic adenoma; postevacuation rectal pain  INFORMED CONSENT:  The risks, benefits, alternatives and imponderables including but not limited to bleeding, perforation as well as the possibility of a missed lesion have been reviewed.  The potential for biopsy, lesion removal, etc. have also been discussed.  Questions have been answered.  All parties agreeable. Please see the history and physical in the medical record for more information.  MEDICATIONS: Versed 1.5 milligrams IV and Demerol 25 mg IV. Zofran 4 mg IV  DESCRIPTION OF PROCEDURE:  After a digital rectal exam was performed, the EC-3890Li ([id number]) and EC-3490TLi ([id number]) colonoscope was advanced from the anus through the rectum and colon to the area of the cecum, ileocecal valve and appendiceal orifice. The cecum was deeply intubated.  These structures were well-seen and photographed for the record.  From the level of the cecum and ileocecal valve, the scope was slowly and cautiously withdrawn. The mucosal surfaces were carefully surveyed utilizing scope tip deflection to facilitate fold flattening as needed.  The scope was pulled down into the rectum where a thorough examination including retroflexion was performed.    FINDINGS:  Inadequate preparation as far as polyp detection is concerned.  Minimal internal hemorrhoids; otherwise, normal-appearing rectal mucosa.  At the rectosigmoid junction there were (2) 4 mm polyps. In the mid ascending segment,  there were (3) 5-6 mm polyps; otherwise, the remainder of the colonic  mucosa appeared grossly normal. The distal 5 cm of terminal ileal mucosa appeared normal.  THERAPEUTIC / DIAGNOSTIC MANEUVERS PERFORMED:  All of the above-mentioned polyps were hot snared removed and recovered for the pathologist  COMPLICATIONS: patient was noted to have asymptomatic bradycardia as low as the 30s before, during and after the procedure requiring no intervention.  CECAL WITHDRAWAL TIME:  18 minutes  IMPRESSION:  Multiple colonic polyps-removed as described above. Minimal internal hemorrhoids-cannot exclude small anal fissure.  RECOMMENDATIONS:   nitroglycerin 0.125% compounded ointment-apply pea-sized amount to the anorectum 3 times a day preferably prior to BMs. Begin Benefiber 2 teaspoons twice daily or Metamucil one dose daily. Followup on pathology.  Depending on patient's response, he may benefit from a hemorrhoid banding.  As discussed with the patient and his daughter, I recommend he followup with Dr. Wolfgang Phoenix and his cardiologist regarding asymptomatic bradycardia noted today.   Office visit with [place] in 4 weeks.   _______________________________ eSigned:  R. Garfield Cornea, MD FACP [facility] 10/14/2013 8:43 AM   CC:    PATIENT NAME:  Radene Knee MR#: [id number]

## 2013-10-14 NOTE — H&P (View-Only) (Signed)
Referring Provider: Mikey Kirschner, MD Primary Care Physician:  Rubbie Battiest, MD Primary GI: Dr. Gala Romney   Chief Complaint  Patient presents with  . Follow-up    HPI:   Evan Melendez returns today in follow-up for hemorrhoids, rectal irritation. Daughter is a Marine scientist, lives in [place]. Present for today's appointment. Last seen end of Jan 2015 by myself, prescribed Ana-lex. Question of internal hemorrhoids, possible anal fissure. He has done well with the cream but still notes intermittent rectal discomfort. Denies constipation, straining. No rectal bleeding. Actually due for surveillance colonoscopy, as he has a history of adenomatous polyps with last colonoscopy in 2010. Symptoms have been chronic, with multiple visits to primary care due to irritation prior to seeing GI. Both patient and daughter interested in pursuing one last lower GI evaluation to lay issue to rest.   Past Medical History  Diagnosis Date  . Essential hypertension, benign   . Anemia   . Gout   . ED (erectile dysfunction)   . Mixed hyperlipidemia   . Coronary atherosclerosis of native coronary artery     Multivessel status post CABG  . Impaired fasting glucose   . Insomnia   . CTS (carpal tunnel syndrome)   . Chronic systolic heart failure   . Myocardial infarction 1985  . Ischemic cardiomyopathy     LVEF 30% with severe LVH  . DDD (degenerative disc disease), lumbar   . CHF (congestive heart failure)   . Hearing difficulty     Past Surgical History  Procedure Laterality Date  . Appendectomy    . Prostatectomy    . Cholecystectomy    . Tonsillectomy    . Colonoscopy    . Inguinal hernia repair    . Coronary artery bypass graft  2004    Dr. Roxy Manns - LIMA to LAD, SVG to circumflex, SVG to RCA  . Hand surgery Bilateral     Dupuytren's contracture repair bilaterally, right carpal tunnel syndrome surgery  . Nasal sinus surgery    . Colonoscopy  2003    Dr. Lindalou Hose: adenomatous polyp  .  Colonoscopy  2010    Dr. Lindalou Hose: tubular adenoma, hyperplastic polyp. Surveillance requested for 2013    Current Outpatient Prescriptions  Medication Sig Dispense Refill  . allopurinol (ZYLOPRIM) 100 MG tablet Take 1 tablet (100 mg total) by mouth at bedtime.  30 tablet  2  . ALPRAZolam (XANAX) 0.5 MG tablet Take 1 tablet (0.5 mg total) by mouth 3 (three) times daily as needed for sleep or anxiety.  24 tablet  0  . Ascorbic Acid (VITAMIN C) 100 MG tablet Take 100 mg by mouth daily.      Marland Kitchen aspirin 81 MG tablet Take 81 mg by mouth every morning.       . carvedilol (COREG) 12.5 MG tablet TAKE 1 TABLET BY MOUTH TWICE DAILY WITH A MEAL  30 tablet  6  . donepezil (ARICEPT) 5 MG tablet Take 1 tablet (5 mg total) by mouth at bedtime.  30 tablet  3  . enalapril (VASOTEC) 10 MG tablet Take 10 mg by mouth every morning.       . etodolac (LODINE) 400 MG tablet TAKE 1 TABLET TWICE DAILY WITH FOOD AS NEEDED FOR PAIN.  40 tablet  2  . ferrous sulfate 325 (65 FE) MG tablet Take 325 mg by mouth 2 (two) times daily.       . furosemide (LASIX) 40 MG tablet Take 1 tablet (40 mg total)  by mouth daily.  30 tablet  5  . potassium chloride SA (K-DUR,KLOR-CON) 20 MEQ tablet Take 2 tablets (40 mEq total) by mouth every morning.  60 tablet  11  . Pyridoxine HCl (VITAMIN B-6 PO) Take 1 tablet by mouth daily.       . simvastatin (ZOCOR) 40 MG tablet Take 40 mg by mouth every evening.      . hydrocortisone (ANUSOL-HC) 2.5 % rectal cream Place 1 application rectally 2 (two) times daily as needed for hemorrhoids.  30 g  prn  . Lidocaine-Hydrocortisone Ace (ANA-LEX) 2-2 % KIT Place cream in applicator and insert in rectum twice a day for 12 days.  24 each  1   No current facility-administered medications for this visit.    Allergies as of 09/18/2013 - Review Complete 09/18/2013  Allergen Reaction Noted  . Quinidine Other (See Comments) 11/01/2012    Family History  Problem Relation Age of Onset  . Dementia Father    . Stroke Father   . Rheum arthritis Mother   . Heart disease Mother   . Colon cancer Neg Hx     History   Social History  . Marital Status: Widowed    Spouse Name: N/A    Number of Children: N/A  . Years of Education: [employer]   Social History Main Topics  . Smoking status: Former Smoker    Types: Cigarettes  . Smokeless tobacco: Never Used  . Alcohol Use: No  . Drug Use: No  . Sexual Activity: None   Other Topics Concern  . None   Social History Narrative  . None    Review of Systems: As mentioned in HPI  Physical Exam: BP 142/70  Pulse 55  Temp(Src) 98.4 F (36.9 C) (Oral)  Wt 151 lb 12.8 oz (68.856 kg) General:   Alert and oriented. No distress noted. Pleasant and cooperative.  Head:  Normocephalic and atraumatic. Eyes:  Conjuctiva clear without scleral icterus. Mouth:  Oral mucosa pink and moist. Good dentition. No lesions. Neck:  Supple, without mass or thyromegaly. Heart:  S1, S2 present without murmurs, rubs, or gallops. Regular rate and rhythm. Abdomen:  +BS, soft, non-tender and non-distended. No rebound or guarding. No HSM or masses noted. Msk:  Symmetrical without gross deformities. Normal posture. Extremities:  Without edema. Neurologic:  Alert and  oriented x4;  grossly normal neurologically. Skin:  Intact without significant lesions or rashes. Psych:  Alert and cooperative. Normal mood and affect.

## 2013-10-14 NOTE — Interval H&P Note (Signed)
History and Physical Interval Note:  10/14/2013 7:36 AM  Evan Melendez  has presented today for surgery, with the diagnosis of RECTAL DISCOMFORT  The various methods of treatment have been discussed with the patient and family. After consideration of risks, benefits and other options for treatment, the patient has consented to  Procedure(s) with comments: COLONOSCOPY (N/A) - 730 as a surgical intervention .  The patient's history has been reviewed, patient examined, no change in status, stable for surgery.  I have reviewed the patient's chart and labs.  Questions were answered to the patient's satisfaction.     No change. Colonoscopy per plan.The risks, benefits, limitations, alternatives and imponderables have been reviewed with the patient. Questions have been answered. All parties are agreeable.   Manus Rudd

## 2013-10-14 NOTE — Discharge Instructions (Addendum)
Colonoscopy Discharge Instructions  Read the instructions outlined below and refer to this sheet in the next few weeks. These discharge instructions provide you with general information on caring for yourself after you leave the hospital. Your doctor may also give you specific instructions. While your treatment has been planned according to the most current medical practices available, unavoidable complications occasionally occur. If you have any problems or questions after discharge, call Dr. Gala Romney at [phone]. ACTIVITY  You may resume your regular activity, but move at a slower pace for the next 24 hours.   Take frequent rest periods for the next 24 hours.   Walking will help get rid of the air and reduce the bloated feeling in your belly (abdomen).   No driving for 24 hours (because of the medicine (anesthesia) used during the test).    Do not sign any important legal documents or operate any machinery for 24 hours (because of the anesthesia used during the test).  NUTRITION  Drink plenty of fluids.   You may resume your normal diet as instructed by your doctor.   Begin with a light meal and progress to your normal diet. Heavy or fried foods are harder to digest and may make you feel sick to your stomach (nauseated).   Avoid alcoholic beverages for 24 hours or as instructed.  MEDICATIONS  You may resume your normal medications unless your doctor tells you otherwise.  WHAT YOU CAN EXPECT TODAY  Some feelings of bloating in the abdomen.   Passage of more gas than usual.   Spotting of blood in your stool or on the toilet paper.  IF YOU HAD POLYPS REMOVED DURING THE COLONOSCOPY:  No aspirin products for 7 days or as instructed.   No alcohol for 7 days or as instructed.   Eat a soft diet for the next 24 hours.  FINDING OUT THE RESULTS OF YOUR TEST Not all test results are available during your visit. If your test results are not back during the visit, make an appointment  with your caregiver to find out the results. Do not assume everything is normal if you have not heard from your caregiver or the medical facility. It is important for you to follow up on all of your test results.  SEEK IMMEDIATE MEDICAL ATTENTION IF:  You have more than a spotting of blood in your stool.   Your belly is swollen (abdominal distention).   You are nauseated or vomiting.   You have a temperature over 101.   You have abdominal pain or discomfort that is severe or gets worse throughout the day.    Hemorrhoid and polyp information provided  Begin Benefiber 2 teaspoons twice daily or 1 dose of Metamucil daily  Nitroglycerin or 0.125% compounded ointment-apply pea-sized amount to the anorectum 3 times daily - preferably before bowel movement  Office visit with [place] in 3-4 weeks  You may benefit from hemorrhoid banding   Further recommendations to follow pending review of pathology report  Followup with Dr. Wolfgang Phoenix or cardiologist regarding slow heart rate noted today  Hemorrhoids Hemorrhoids are swollen veins around the rectum or anus. There are two types of hemorrhoids:   Internal hemorrhoids. These occur in the veins just inside the rectum. They may poke through to the outside and become irritated and painful.  External hemorrhoids. These occur in the veins outside the anus and can be felt as a painful swelling or hard lump near the anus. CAUSES  Pregnancy.   Obesity.  Constipation or diarrhea.   Straining to have a bowel movement.   Sitting for long periods on the toilet.  Heavy lifting or other activity that caused you to strain.  Anal intercourse. SYMPTOMS   Pain.   Anal itching or irritation.   Rectal bleeding.   Fecal leakage.   Anal swelling.   One or more lumps around the anus.  DIAGNOSIS  Your caregiver may be able to diagnose hemorrhoids by visual examination. Other examinations or tests that may be performed include:    Examination of the rectal area with a gloved hand (digital rectal exam).   Examination of anal canal using a small tube (scope).   A blood test if you have lost a significant amount of blood.  A test to look inside the colon (sigmoidoscopy or colonoscopy). TREATMENT Most hemorrhoids can be treated at home. However, if symptoms do not seem to be getting better or if you have a lot of rectal bleeding, your caregiver may perform a procedure to help make the hemorrhoids get smaller or remove them completely. Possible treatments include:   Placing a rubber band at the base of the hemorrhoid to cut off the circulation (rubber band ligation).   Injecting a chemical to shrink the hemorrhoid (sclerotherapy).   Using a tool to burn the hemorrhoid (infrared light therapy).   Surgically removing the hemorrhoid (hemorrhoidectomy).   Stapling the hemorrhoid to block blood flow to the tissue (hemorrhoid stapling).  HOME CARE INSTRUCTIONS   Eat foods with fiber, such as whole grains, beans, nuts, fruits, and vegetables. Ask your doctor about taking products with added fiber in them (fibersupplements).  Increase fluid intake. Drink enough water and fluids to keep your urine clear or pale yellow.   Exercise regularly.   Go to the bathroom when you have the urge to have a bowel movement. Do not wait.   Avoid straining to have bowel movements.   Keep the anal area dry and clean. Use wet toilet paper or moist towelettes after a bowel movement.   Medicated creams and suppositories may be used or applied as directed.   Only take over-the-counter or prescription medicines as directed by your caregiver.   Take warm sitz baths for 15 20 minutes, 3 4 times a day to ease pain and discomfort.   Place ice packs on the hemorrhoids if they are tender and swollen. Using ice packs between sitz baths may be helpful.   Put ice in a plastic bag.   Place a towel between your skin and the  bag.   Leave the ice on for 15 20 minutes, 3 4 times a day.   Do not use a donut-shaped pillow or sit on the toilet for long periods. This increases blood pooling and pain.  SEEK MEDICAL CARE IF:  You have increasing pain and swelling that is not controlled by treatment or medicine.  You have uncontrolled bleeding.  You have difficulty or you are unable to have a bowel movement.  You have pain or inflammation outside the area of the hemorrhoids. MAKE SURE YOU:  Understand these instructions.  Will watch your condition.  Will get help right away if you are not doing well or get worse.   Colon Polyps Polyps are lumps of extra tissue growing inside the body. Polyps can grow in the large intestine (colon). Most colon polyps are noncancerous (benign). However, some colon polyps can become cancerous over time. Polyps that are larger than a pea may be harmful. To be  safe, caregivers remove and test all polyps. CAUSES  Polyps form when mutations in the genes cause your cells to grow and divide even though no more tissue is needed. RISK FACTORS There are a number of risk factors that can increase your chances of getting colon polyps. They include:  Being older than 50 years.  Family history of colon polyps or colon cancer.  Long-term colon diseases, such as colitis or Crohn disease.  Being overweight.  Smoking.  Being inactive.  Drinking too much alcohol. SYMPTOMS  Most small polyps do not cause symptoms. If symptoms are present, they may include:  Blood in the stool. The stool may look dark red or black.  Constipation or diarrhea that lasts longer than 1 week. DIAGNOSIS People often do not know they have polyps until their caregiver finds them during a regular checkup. Your caregiver can use 4 tests to check for polyps:  Digital rectal exam. The caregiver wears gloves and feels inside the rectum. This test would find polyps only in the rectum.  Barium enema. The  caregiver puts a liquid called barium into your rectum before taking X-rays of your colon. Barium makes your colon look white. Polyps are dark, so they are easy to see in the X-ray pictures.  Sigmoidoscopy. A thin, flexible tube (sigmoidoscope) is placed into your rectum. The sigmoidoscope has a light and tiny camera in it. The caregiver uses the sigmoidoscope to look at the last third of your colon.  Colonoscopy. This test is like sigmoidoscopy, but the caregiver looks at the entire colon. This is the most common method for finding and removing polyps. TREATMENT  Any polyps will be removed during a sigmoidoscopy or colonoscopy. The polyps are then tested for cancer. PREVENTION  To help lower your risk of getting more colon polyps:  Eat plenty of fruits and vegetables. Avoid eating fatty foods.  Do not smoke.  Avoid drinking alcohol.  Exercise every day.  Lose weight if recommended by your caregiver.  Eat plenty of calcium and folate. Foods that are rich in calcium include milk, cheese, and broccoli. Foods that are rich in folate include chickpeas, kidney beans, and spinach. HOME CARE INSTRUCTIONS Keep all follow-up appointments as directed by your caregiver. You may need periodic exams to check for polyps. SEEK MEDICAL CARE IF: You notice bleeding during a bowel movement. Marland Kitchen

## 2013-10-16 ENCOUNTER — Encounter: Payer: Self-pay | Admitting: Family Medicine

## 2013-10-16 ENCOUNTER — Encounter: Payer: Self-pay | Admitting: Internal Medicine

## 2013-10-17 ENCOUNTER — Encounter (HOSPITAL_COMMUNITY): Payer: Self-pay | Admitting: Internal Medicine

## 2013-10-30 ENCOUNTER — Telehealth: Payer: Self-pay | Admitting: Family Medicine

## 2013-10-30 MED ORDER — ENALAPRIL MALEATE 10 MG PO TABS: 10.0000 mg | ORAL_TABLET | Freq: Every morning | ORAL | Status: DC

## 2013-10-30 NOTE — Telephone Encounter (Signed)
Medication was sent to pharmacy. [name] was notified.

## 2013-10-30 NOTE — Telephone Encounter (Signed)
Patients medicines are being transferred to [facility], wants to know if we can send in an RX for enalapril to [facility].

## 2013-11-07 ENCOUNTER — Encounter: Payer: Self-pay | Admitting: Gastroenterology

## 2013-11-07 ENCOUNTER — Ambulatory Visit (INDEPENDENT_AMBULATORY_CARE_PROVIDER_SITE_OTHER): Payer: Medicare Other | Admitting: Gastroenterology

## 2013-11-07 VITALS — BP 144/64 | HR 53 | Temp 98.5°F | Ht 70.0 in | Wt 155.6 lb

## 2013-11-07 DIAGNOSIS — I2510 Atherosclerotic heart disease of native coronary artery without angina pectoris: Secondary | ICD-10-CM

## 2013-11-07 DIAGNOSIS — K6289 Other specified diseases of anus and rectum: Secondary | ICD-10-CM

## 2013-11-07 NOTE — Patient Instructions (Signed)
I spoke to your pharmacist, Laurey Arrow. He is making additional standard suppositories instead of the "rectal rockets", this way you can use the suppositories twice a day.  Suppositories twice a day (morning and evening) for 12 days.   Nitroglycerin ointment, pea-sized amount three times a day for an additional 2 weeks.   You will be see Dr. Gala Romney in the near future for a hemorrhoid banding!

## 2013-11-08 ENCOUNTER — Telehealth: Payer: Self-pay | Admitting: Family Medicine

## 2013-11-08 MED ORDER — FUROSEMIDE 40 MG PO TABS: 40.0000 mg | ORAL_TABLET | Freq: Every day | ORAL | Status: DC

## 2013-11-08 NOTE — Telephone Encounter (Signed)
Patient needs refills on his furosemide 40 mg, 1 tab by mouth daily.

## 2013-11-13 ENCOUNTER — Encounter: Payer: Self-pay | Admitting: Cardiology

## 2013-11-13 ENCOUNTER — Encounter: Payer: Medicare Other | Admitting: Cardiology

## 2013-11-13 ENCOUNTER — Encounter: Payer: Self-pay | Admitting: Gastroenterology

## 2013-11-13 NOTE — Progress Notes (Signed)
Referring Provider: Mikey Kirschner, MD Primary Care Physician:  Rubbie Battiest, MD Primary GI: Dr. Gala Romney    Chief Complaint  Patient presents with  . Follow-up    HPI:   Evan Melendez presents today in follow-up after colonoscopy, revealing internal hemorrhoids, unable to exclude small anal fissure, multiple tubular adenomas.  Had done well but now notes recurrent discomfort. Interested in hemorrhoid banding. No constipation or diarrhea. Overall doing well.   Past Medical History  Diagnosis Date  . Essential hypertension, benign   . Anemia   . Gout   . ED (erectile dysfunction)   . Mixed hyperlipidemia   . Coronary atherosclerosis of native coronary artery     Multivessel status post CABG  . Impaired fasting glucose   . Insomnia   . CTS (carpal tunnel syndrome)   . Chronic systolic heart failure   . Myocardial infarction 1985  . Ischemic cardiomyopathy     LVEF 30% with severe LVH  . DDD (degenerative disc disease), lumbar   . CHF (congestive heart failure)   . Hearing difficulty   . Dementia     Past Surgical History  Procedure Laterality Date  . Appendectomy    . Prostatectomy    . Cholecystectomy    . Tonsillectomy    . Colonoscopy    . Coronary artery bypass graft  2004    Dr. Roxy Manns - LIMA to LAD, SVG to circumflex, SVG to RCA  . Hand surgery Bilateral     Dupuytren's contracture repair bilaterally, right carpal tunnel syndrome surgery  . Nasal sinus surgery    . Colonoscopy  2003    Dr. Lindalou Hose: adenomatous polyp  . Colonoscopy  2010    Dr. Lindalou Hose: tubular adenoma, hyperplastic polyp. Surveillance requested for 2013  . Inguinal hernia repair Right   . Colonoscopy N/A 10/14/2013    SFK:CLEXNTZG colonic polyps-removed as described above/Minimal internal hemorrhoids-cannot exclude small anal fissure. Tubular adenomas    Current Outpatient Prescriptions  Medication Sig Dispense Refill  . allopurinol (ZYLOPRIM) 100 MG tablet Take 1 tablet (100 mg  total) by mouth at bedtime.  30 tablet  2  . ALPRAZolam (XANAX) 0.5 MG tablet Take 1 tablet (0.5 mg total) by mouth 3 (three) times daily as needed for sleep or anxiety.  24 tablet  0  . Ascorbic Acid (VITAMIN C) 100 MG tablet Take 100 mg by mouth daily.      Marland Kitchen aspirin 81 MG tablet Take 81 mg by mouth every morning.       . carvedilol (COREG) 12.5 MG tablet TAKE 1 TABLET BY MOUTH TWICE DAILY WITH A MEAL EMERGENCY REFILL FAXED DR  60 tablet  6  . donepezil (ARICEPT) 5 MG tablet Take 1 tablet (5 mg total) by mouth at bedtime.  30 tablet  3  . enalapril (VASOTEC) 10 MG tablet Take 1 tablet (10 mg total) by mouth every morning.  30 tablet  5  . etodolac (LODINE) 400 MG tablet TAKE 1 TABLET TWICE DAILY WITH FOOD AS NEEDED FOR PAIN.  40 tablet  2  . ferrous sulfate 325 (65 FE) MG tablet Take 325 mg by mouth 2 (two) times daily.       . Lidocaine-Hydrocortisone Ace (ANA-LEX) 2-2 % KIT Place cream in applicator and insert in rectum twice a day for 12 days.  24 each  1  . potassium chloride SA (K-DUR,KLOR-CON) 20 MEQ tablet Take 2 tablets (40 mEq total) by mouth every morning.  [place]  tablet  11  . Pyridoxine HCl (VITAMIN B-6 PO) Take 1 tablet by mouth daily.       . simvastatin (ZOCOR) 40 MG tablet Take 40 mg by mouth every evening.      . furosemide (LASIX) 40 MG tablet Take 1 tablet (40 mg total) by mouth daily.  30 tablet  5  . polyethylene glycol-electrolytes (TRILYTE) 420 G solution Take 4,000 mLs by mouth as directed.  4000 mL  0   No current facility-administered medications for this visit.    Allergies as of 11/07/2013 - Review Complete 11/07/2013  Allergen Reaction Noted  . Quinidine Other (See Comments) 11/01/2012    Family History  Problem Relation Age of Onset  . Dementia Father   . Stroke Father   . Rheum arthritis Mother   . Heart disease Mother   . Colon cancer Neg Hx     History   Social History  . Marital Status: Widowed    Spouse Name: N/A    Number of Children: N/A  .  Years of Education: [employer]   Social History Main Topics  . Smoking status: Former Smoker -- 3.00 packs/day for 30 years    Types: Cigarettes    Quit date: 10/15/1983  . Smokeless tobacco: Never Used  . Alcohol Use: No  . Drug Use: No  . Sexual Activity: Yes    Birth Control/ Protection: None   Other Topics Concern  . None   Social History Narrative  . None    Review of Systems: As mentioned in HPI.   Physical Exam: BP 144/64  Pulse 53  Temp(Src) 98.5 F (36.9 C) (Oral)  Ht $R'5\' 10"'Io$  (1.778 m)  Wt 155 lb 9.6 oz (70.58 kg)  BMI 22.33 kg/m2 General:   Alert and oriented. No distress noted. Pleasant and cooperative.  Head:  Normocephalic and atraumatic. Eyes:  Conjuctiva clear without scleral icterus. Mouth:  Oral mucosa pink and moist. Good dentition. No lesions. Abdomen:  +BS, soft, non-tender and non-distended. No rebound or guarding. No HSM or masses noted. Neurologic:  Alert and  oriented x4;  grossly normal neurologically. Skin:  Intact without significant lesions or rashes. Psych:  Alert and cooperative. Normal mood and affect.

## 2013-11-13 NOTE — Progress Notes (Signed)
No show  This encounter was created in error - please disregard.

## 2013-11-13 NOTE — Assessment & Plan Note (Signed)
Recent colonoscopy on file with internal hemorrhoids, query occult anal fissure. Symptomatic improvement with prescriptive cream. Interested in hemorrhoid banding. Due to patient's longstanding concerns, will proceed with outpatient banding with Dr. Gala Romney in the future. In the interim, discussed with Laurey Arrow, pharmacist at [facility], who has compounded "rectal rockets" for patient previously. Will use this (compromised of hydrocortisone and lidocaine) twice a day for 12 days. Nitro per rectum TID for an additional 2 weeks. Outpatient banding with Dr. Gala Romney.

## 2013-11-14 NOTE — Progress Notes (Signed)
cc'd to pcp 

## 2013-11-26 ENCOUNTER — Encounter: Payer: Self-pay | Admitting: Internal Medicine

## 2013-11-26 ENCOUNTER — Ambulatory Visit (INDEPENDENT_AMBULATORY_CARE_PROVIDER_SITE_OTHER): Payer: Medicare Other | Admitting: Internal Medicine

## 2013-11-26 VITALS — BP 145/88 | HR 57 | Temp 97.9°F | Ht 66.0 in | Wt 151.6 lb

## 2013-11-26 DIAGNOSIS — R001 Bradycardia, unspecified: Secondary | ICD-10-CM

## 2013-11-26 DIAGNOSIS — K648 Other hemorrhoids: Secondary | ICD-10-CM

## 2013-11-26 NOTE — Progress Notes (Signed)
[place] banding procedure note:  Patient has symptomatic hemorrhoids (itching, pain and leaking/swelling). Some improvement with topical nitroglycerin and hydrocortisone. Patient desires hemorrhoid banding. Patient failed to keep his appointment with the cardiologist on April 22 regarding bradycardia observed during his colonoscopy. Patient and wife apparently got confused about the appointment.  All risks, benefits, and alternative forms of therapy were described and informed consent was obtained.  In the left lateral decubitus position, a digital rectal exam revealed no abnormalities. Anoscopy performed revealing a prominent right posterior internal hemorrhoid column.  The decision was made to band the right posterior internal hemorrhoid;  the [place] was used to perform band ligation without complication. Digital anorectal examination was then performed to assure proper positioning of the band;  Band found to be in good position, however, patient reported  pinching after placement. I went back and digitally loosened up the band just a bit with resolution of the pinching sensation. The patient was discharged home without pain or other issues. Dietary and behavioral recommendations were given; The patient will return in 2-3 weeks for followup and possible additional banding as required. My office staff will get with a cardiologist to get him back on their schedule to be seen. No complications were encountered and the patient tolerated the procedure well.

## 2013-11-26 NOTE — Patient Instructions (Signed)
Avoid straining.  Benefiber 2 teaspoons twice daily  Limit toilet time to 2-3 minutes  Call with any interim problems  Schedule followup appointment in 2-3 weeks from now  See cardiologist as previously recommended for slow heart rate  Will need a repeat colonoscopy in one year.

## 2013-11-27 ENCOUNTER — Ambulatory Visit: Payer: Medicare Other | Admitting: Family Medicine

## 2013-11-29 ENCOUNTER — Telehealth: Payer: Self-pay

## 2013-11-29 NOTE — Telephone Encounter (Signed)
Spoke with pts daughter, he is not having any problems from the banding. He feels bloated and doesn't think he has had a bm since Tuesday. Encouraged her to make sure he is taking his fiber and drinking plenty of liquids, she said she is reminding him. Also advised her to try otc miralax this weekend and if he continues to have problems she needed to call us on Monday.

## 2013-11-29 NOTE — Telephone Encounter (Signed)
Pt is bloated and has not had a bowel movement. He is wanting to know if he can take something for this. His daughter(Evan Melendez) is calling for him this morning and she said if we could call her back.He is not in any pain from the banding. Her call back number is [phone].

## 2013-12-10 ENCOUNTER — Ambulatory Visit: Payer: Medicare Other | Admitting: Nurse Practitioner

## 2013-12-10 ENCOUNTER — Other Ambulatory Visit: Payer: Self-pay | Admitting: Family Medicine

## 2013-12-10 ENCOUNTER — Other Ambulatory Visit: Payer: Self-pay | Admitting: Neurology

## 2013-12-11 ENCOUNTER — Encounter: Payer: Self-pay | Admitting: Family Medicine

## 2013-12-11 ENCOUNTER — Ambulatory Visit (INDEPENDENT_AMBULATORY_CARE_PROVIDER_SITE_OTHER): Payer: Medicare Other | Admitting: Family Medicine

## 2013-12-11 VITALS — BP 132/70 | Temp 98.9°F | Ht 69.0 in | Wt 155.0 lb

## 2013-12-11 DIAGNOSIS — I2510 Atherosclerotic heart disease of native coronary artery without angina pectoris: Secondary | ICD-10-CM

## 2013-12-11 DIAGNOSIS — K6289 Other specified diseases of anus and rectum: Secondary | ICD-10-CM

## 2013-12-11 MED ORDER — LIDOCAINE-HYDROCORTISONE ACE 2-2 % RE KIT: PACK | RECTAL | Status: DC

## 2013-12-11 NOTE — Progress Notes (Signed)
   Subjective:    Patient ID: Evan Melendez, male    DOB: July 24, 1926, 80 y.o.   MRN: [id number]  HPIHemorroid flare up. Started yesterday. Using prescription cream BID.   No difficulties with constipation no abdominal pain. No back pain.  Just saw a gastroenterologist. They just did a internal hemorrhoid banding.  Continues to have same symptoms. Notes that as irritation. Not a major pain not keep them out.  Review of Systems    no fever no chills no vomiting ROS otherwise negative Objective:   Physical Exam  Alert no acute distress. Lungs clear. Heart regular in rhythm. Abdomen benign. Rectal exam no frank abnormality. Prostate normal. Banded hemorrhoid palpated nontender      Assessment & Plan:  Impression nonspecific rectal irritation. Patient has had pretty much every evaluation and test possible in this regard. Likely concave by #2 #2 mild dementia stable #3 recent bradycardia episode. Patient to see cardiologist in. Plan maintain same treatment. Warning signs discussed. WSL

## 2013-12-11 NOTE — Telephone Encounter (Signed)
Attempted to contact patient multiple times to see how he is tolerating this dose, but was not able to reach him.  Sending refill to last until his apt so he is not without meds.

## 2013-12-17 ENCOUNTER — Ambulatory Visit (INDEPENDENT_AMBULATORY_CARE_PROVIDER_SITE_OTHER): Payer: Medicare Other | Admitting: Internal Medicine

## 2013-12-17 ENCOUNTER — Encounter: Payer: Self-pay | Admitting: Internal Medicine

## 2013-12-17 VITALS — BP 134/57 | HR 60 | Temp 97.6°F | Ht 68.0 in | Wt 154.2 lb

## 2013-12-17 DIAGNOSIS — K648 Other hemorrhoids: Secondary | ICD-10-CM

## 2013-12-17 NOTE — Patient Instructions (Signed)
Avoid straining.  Benefiber 2 teaspoons twice daily  Limit toilet time to 2-3 minutes  Call with any interim problems  Schedule followup appointment in 2-3 weeks from now   

## 2013-12-17 NOTE — Progress Notes (Signed)
[facility] banding procedure note:   Status post post banding the right posterior hemorrhoid on May 5. Patient states pain burning itching have all improved since banding. He did run over a bad bump on his long more and had a little burning to come back recently otherwise he states banding help and desires to be in be placed. All risks and benefits and alternatives along with limitations were explained.  Digital rectal exam revealed no abnormalities. Using the [place] banding device, a band was placed in the neutral position. Hemorrhoid was engaged. It was deployed.. Patient had a little pinching after this maneuver. Digital rectal exam revealed a band being in good position however, it was loosened up a bit digitally. With this maneuver, patient experienced resolution of discomfort. No complications associated with this procedure. See discharge instructions.

## 2013-12-19 ENCOUNTER — Ambulatory Visit (INDEPENDENT_AMBULATORY_CARE_PROVIDER_SITE_OTHER): Payer: Medicare Other | Admitting: Cardiology

## 2013-12-19 ENCOUNTER — Encounter: Payer: Self-pay | Admitting: Cardiology

## 2013-12-19 VITALS — BP 130/46 | HR 56 | Ht 69.0 in | Wt 152.0 lb

## 2013-12-19 DIAGNOSIS — R001 Bradycardia, unspecified: Secondary | ICD-10-CM

## 2013-12-19 DIAGNOSIS — I498 Other specified cardiac arrhythmias: Secondary | ICD-10-CM

## 2013-12-19 DIAGNOSIS — I10 Essential (primary) hypertension: Secondary | ICD-10-CM

## 2013-12-19 DIAGNOSIS — I2510 Atherosclerotic heart disease of native coronary artery without angina pectoris: Secondary | ICD-10-CM

## 2013-12-19 MED ORDER — CARVEDILOL 6.25 MG PO TABS: 6.2500 mg | ORAL_TABLET | Freq: Two times a day (BID) | ORAL | Status: DC

## 2013-12-19 NOTE — Assessment & Plan Note (Signed)
Multivessel disease status post prior CABG with evidence of ischemic cardiomyopathy Plan is to continue conservative treatment, medical therapy and observation for now. Followup arranged.

## 2013-12-19 NOTE — Assessment & Plan Note (Signed)
Not entirely clear that this is symptomatic, however with documented worsening slow heart rates with recent endoscopic procedure, plan will be to reduce Coreg to 6.25 g twice daily, particularly since he remains on Aricept which can also precipitate bradycardia.

## 2013-12-19 NOTE — Progress Notes (Signed)
Clinical Summary Evan Melendez is an 80 y.o.male last seen in December 2014. Interval records reviewed. Most recently he has been evaluated for hemorrhoids, status post banding procedure. Reportedly, he had some bradycardia documented during one of his endoscopic procedures.  He is here with his daughter today. He reports no dizziness or syncope, no angina symptoms. We reviewed his medications, includes Coreg and Aricept (can sometimes worsen bradycardia).  Lab work from March of this year showed cholesterol 136, triglycerides 143, HDL 44, LDL 63, normal LFTs, potassium 4.0, BUN 38, creatinine 1.6.  Exercise Myoview from May 2014 demonstrated significant area of scar involving the inferior, inferoseptal, and inferoapical myocardium. No large ischemic zones noted and LVEF was 28%, consistent with recent echocardiogram. After discussions with the patient and family, we have been treating him conservatively with medical therapy, no plans for invasive testing or a device at this point.   Allergies  Allergen Reactions  . Quinidine Other (See Comments)    Chest Pain    Current Outpatient Prescriptions  Medication Sig Dispense Refill  . acetaminophen (TYLENOL) 325 MG tablet Take 650 mg by mouth 2 (two) times daily.      Marland Kitchen allopurinol (ZYLOPRIM) 100 MG tablet TAKE 1 TABLET BY MOUTH AT BEDTIME.  30 tablet  2  . ALPRAZolam (XANAX) 0.5 MG tablet Take 1 tablet (0.5 mg total) by mouth 3 (three) times daily as needed for sleep or anxiety.  24 tablet  0  . Ascorbic Acid (VITAMIN C) 100 MG tablet Take 100 mg by mouth daily.      Marland Kitchen aspirin 81 MG tablet Take 81 mg by mouth every morning.       . donepezil (ARICEPT) 5 MG tablet TAKE 1 TABLET BY MOUTH AT BEDTIME  30 tablet  0  . enalapril (VASOTEC) 10 MG tablet Take 1 tablet (10 mg total) by mouth every morning.  30 tablet  5  . etodolac (LODINE) 400 MG tablet TAKE 1 TABLET TWICE DAILY WITH FOOD AS NEEDED FOR PAIN.  40 tablet  2  . ferrous sulfate 325 (65  FE) MG tablet Take 325 mg by mouth 2 (two) times daily.       . furosemide (LASIX) 40 MG tablet Take 1 tablet (40 mg total) by mouth daily.  30 tablet  5  . Lidocaine-Hydrocortisone Ace 2-2 % KIT as needed. Place cream in applicator and insert in rectum twice a day for 12 days.      . potassium chloride SA (K-DUR,KLOR-CON) 20 MEQ tablet Take 2 tablets (40 mEq total) by mouth every morning.  60 tablet  11  . Pyridoxine HCl (VITAMIN B-6 PO) Take 1 tablet by mouth daily.       . simvastatin (ZOCOR) 40 MG tablet Take 40 mg by mouth every evening.      . tamsulosin (FLOMAX) 0.4 MG CAPS capsule TAKE 1 CAPSULE BY MOUTH DAILY AFTER SUPPER  30 capsule  6  . carvedilol (COREG) 6.25 MG tablet Take 1 tablet (6.25 mg total) by mouth 2 (two) times daily.  180 tablet  3   No current facility-administered medications for this visit.    Past Medical History  Diagnosis Date  . Essential hypertension, benign   . Anemia   . Gout   . ED (erectile dysfunction)   . Mixed hyperlipidemia   . Coronary atherosclerosis of native coronary artery     Multivessel status post CABG  . Impaired fasting glucose   . Insomnia   .  CTS (carpal tunnel syndrome)   . Chronic systolic heart failure   . Myocardial infarction 1985  . Ischemic cardiomyopathy     LVEF 30% with severe LVH  . DDD (degenerative disc disease), lumbar   . CHF (congestive heart failure)   . Hearing difficulty   . Dementia   . Hemorrhoids     Past Surgical History  Procedure Laterality Date  . Appendectomy    . Prostatectomy    . Cholecystectomy    . Tonsillectomy    . Colonoscopy    . Coronary artery bypass graft  2004    Dr. Roxy Manns - LIMA to LAD, SVG to circumflex, SVG to RCA  . Hand surgery Bilateral     Dupuytren's contracture repair bilaterally, right carpal tunnel syndrome surgery  . Nasal sinus surgery    . Colonoscopy  2003    Dr. Lindalou Hose: adenomatous polyp  . Colonoscopy  2010    Dr. Lindalou Hose: tubular adenoma, hyperplastic  polyp. Surveillance requested for 2013  . Inguinal hernia repair Right   . Colonoscopy N/A 10/14/2013    Dr. Rourk:Multiple colonic polyps-removed as described above/Minimal internal hemorrhoids-cannot exclude small anal fissure. Tubular adenomas  . Hemorrhoid banding      Social History [name] reports that he quit smoking about 30 years ago. His smoking use included Cigarettes. He has a 90 pack-year smoking history. He has never used smokeless tobacco. [name] reports that he does not drink alcohol.  Review of Systems Still having some hemorrhoidal/rectal discomfort. Had recent banding procedure. Negative except as outlined.  Physical Examination Filed Vitals:   12/19/13 1310  BP: 130/46  Pulse: 56   Filed Weights   12/19/13 1310  Weight: 152 lb (68.947 kg)    Comfortable at rest.  HEENT: Conjunctiva and lids normal, oropharynx clear.  Neck: Supple, no elevated JVP, no thyromegaly.  Lungs: Diminished but clear to auscultation, nonlabored breathing at rest.  Cardiac: Regular rate and rhythm, no S3 or significant systolic murmur, no pericardial rub.  Abdomen: Soft, nontender,bowel sounds present.  Extremities: No pitting edema, distal pulses 1-2+.  Skin: Warm and dry, scattered ecchymoses. Musculoskeletal: No kyphosis. Neuropsychiatric: Alert and oriented x3, calm.   Problem List and Plan   Bradycardia Not entirely clear that this is symptomatic, however with documented worsening slow heart rates with recent endoscopic procedure, plan will be to reduce Coreg to 6.25 g twice daily, particularly since he remains on Aricept which can also precipitate bradycardia.  Coronary atherosclerosis of native coronary artery Multivessel disease status post prior CABG with evidence of ischemic cardiomyopathy Plan is to continue conservative treatment, medical therapy and observation for now. Followup arranged.  Essential hypertension, benign No change in current regimen  otherwise.    Satira Sark, M.D., F.A.C.C.

## 2013-12-19 NOTE — Assessment & Plan Note (Signed)
No change in current regimen otherwise.

## 2013-12-19 NOTE — Patient Instructions (Signed)
Your physician recommends that you schedule a follow-up appointment in: 3 months     Your physician has recommended you make the following change in your medication:     DECREASE Coreg to 6.25 mg twice a day      Thank you for choosing [facility] !

## 2013-12-24 ENCOUNTER — Encounter: Payer: Medicare Other | Admitting: Internal Medicine

## 2013-12-30 ENCOUNTER — Encounter: Payer: Self-pay | Admitting: Adult Health

## 2013-12-30 ENCOUNTER — Ambulatory Visit (INDEPENDENT_AMBULATORY_CARE_PROVIDER_SITE_OTHER): Payer: Medicare Other | Admitting: Adult Health

## 2013-12-30 VITALS — BP 131/60 | HR 68 | Ht 68.0 in | Wt 153.0 lb

## 2013-12-30 DIAGNOSIS — R413 Other amnesia: Secondary | ICD-10-CM

## 2013-12-30 DIAGNOSIS — I2510 Atherosclerotic heart disease of native coronary artery without angina pectoris: Secondary | ICD-10-CM

## 2013-12-30 MED ORDER — DONEPEZIL HCL 10 MG PO TABS: 10.0000 mg | ORAL_TABLET | Freq: Every day | ORAL | Status: DC

## 2013-12-30 NOTE — Patient Instructions (Signed)
Dementia Dementia is a general term for problems with brain function. A person with dementia has memory loss and a hard time with at least one other brain function such as thinking, speaking, or problem solving. Dementia can affect social functioning, how you do your job, your mood, or your personality. The changes may be hidden for a long time. The earliest forms of this disease are usually not detected by family or friends. Dementia can be:  Irreversible.  Potentially reversible.  Partially reversible.  Progressive. This means it can get worse over time. CAUSES  Irreversible dementia causes may include:  Degeneration of brain cells (Alzheimer's disease or lewy body dementia).  Multiple small strokes (vascular dementia).  Infection (chronic meningitis or Creutzfelt-Jakob disease).  Frontotemporal dementia. This affects younger people, age 25 to 55, compared to those who have Alzheimer's disease.  Dementia associated with other disorders like Parkinson's disease, Huntington's disease, or HIV-associated dementia. Potentially or partially reversible dementia causes may include:  Medicines.  Metabolic causes such as excessive alcohol intake, vitamin B12 deficiency, or thyroid disease.  Masses or pressure in the brain such as a tumor, blood clot, or hydrocephalus. SYMPTOMS  Symptoms are often hard to detect. Family members or coworkers may not notice them early in the disease process. Different people with dementia may have different symptoms. Symptoms can include:  A hard time with memory, especially recent memory. Long-term memory may not be impaired.  Asking the same question multiple times or forgetting something someone just said.  A hard time speaking your thoughts or finding certain words.  A hard time solving problems or performing familiar tasks (such as how to use a telephone).  Sudden changes in mood.  Changes in personality, especially increasing moodiness or  mistrust.  Depression.  A hard time understanding complex ideas that were never a problem in the past. DIAGNOSIS  There are no specific tests for dementia.   Your caregiver may recommend a thorough evaluation. This is because some forms of dementia can be reversible. The evaluation will likely include a physical exam and getting a detailed history from you and a family member. The history often gives the best clues and suggestions for a diagnosis.  Memory testing may be done. A detailed brain function evaluation called neuropsychologic testing may be helpful.  Lab tests and brain imaging (such as a CT scan or MRI scan) are sometimes important.  Sometimes observation and re-evaluation over time is very helpful. TREATMENT  Treatment depends on the cause.   If the problem is a vitamin deficiency, it may be helped or cured with supplements.  For dementias such as Alzheimer's disease, medicines are available to stabilize or slow the course of the disease. There are no cures for this type of dementia.  Your caregiver can help direct you to groups, organizations, and other caregivers to help with decisions in the care of you or your loved one. HOME CARE INSTRUCTIONS The care of individuals with dementia is varied and dependent upon the progression of the dementia. The following suggestions are intended for the person living with, or caring for, the person with dementia.  Create a safe environment.  Remove the locks on bathroom doors to prevent the person from accidentally locking himself or herself in.  Use childproof latches on kitchen cabinets and any place where cleaning supplies, chemicals, or alcohol are kept.  Use childproof covers in unused electrical outlets.  Install childproof devices to keep doors and windows secured.  Remove stove knobs or install safety  knobs and an automatic shut-off on the stove.  Lower the temperature on water heaters.  Label medicines and keep them  locked up.  Secure knives, lighters, matches, power tools, and guns, and keep these items out of reach.  Keep the house free from clutter. Remove rugs or anything that might contribute to a fall.  Remove objects that might break and hurt the person.  Make sure lighting is good, both inside and outside.  Install grab rails as needed.  Use a monitoring device to alert you to falls or other needs for help.  Reduce confusion.  Keep familiar objects and people around.  Use night lights or dim lights at night.  Label items or areas.  Use reminders, notes, or directions for daily activities or tasks.  Keep a simple, consistent routine for waking, meals, bathing, dressing, and bedtime.  Create a calm, quiet environment.  Place large clocks and calendars prominently.  Display emergency numbers and home address near all telephones.  Use cues to establish different times of the day. An example is to open curtains to let the natural light in during the day.   Use effective communication.  Choose simple words and short sentences.  Use a gentle, calm tone of voice.  Be careful not to interrupt.  If the person is struggling to find a word or communicate a thought, try to provide the word or thought.  Ask one question at a time. Allow the person ample time to answer questions. Repeat the question again if the person does not respond.  Reduce nighttime restlessness.  Provide a comfortable bed.  Have a consistent nighttime routine.  Ensure a regular walking or physical activity schedule. Involve the person in daily activities as much as possible.  Limit napping during the day.  Limit caffeine.  Attend social events that stimulate rather than overwhelm the senses.  Encourage good nutrition and hydration.  Reduce distractions during meal times and snacks.  Avoid foods that are too hot or too cold.  Monitor chewing and swallowing ability.  Continue with routine vision,  hearing, dental, and medical screenings.  Only give over-the-counter or prescription medicines as directed by the caregiver.  Monitor driving abilities. Do not allow the person to drive when safe driving is no longer possible.  Register with an identification program which could provide location assistance in the event of a missing person situation. SEEK MEDICAL CARE IF:   New behavioral problems start such as moodiness, aggressiveness, or seeing things that are not there (hallucinations).  Any new problem with brain function happens. This includes problems with balance, speech, or falling a lot.  Problems with swallowing develop.  Any symptoms of other illness happen. Small changes or worsening in any aspect of brain function can be a sign that the illness is getting worse. It can also be a sign of another medical illness such as infection. Seeing a caregiver right away is important. SEEK IMMEDIATE MEDICAL CARE IF:   A fever develops.  New or worsened confusion develops.  New or worsened sleepiness develops.  Staying awake becomes hard to do. Document Released: 01/04/2001 Document Revised: 10/03/2011 Document Reviewed: 12/06/2010 [facility] Patient Information 2014 [place], [place].

## 2013-12-30 NOTE — Progress Notes (Signed)
I have read the note, and I agree with the clinical assessment and plan.  Altair Stanko K Benyamin Jeff   

## 2013-12-30 NOTE — Progress Notes (Signed)
PATIENT: Evan Melendez DOB: Dec 05, 1925  REASON FOR VISIT: follow up HISTORY FROM: patient  HISTORY OF PRESENT ILLNESS:  Evan Melendez is an 80 year old right-handed white male with a history of memory deficit. He returns today for follow-up. The patient was placed on Aricept at the last visit and states that he is tolerating it well. Patient feels that he does not have an issue with his memory. Daughter states that he will continuously repeat himself. Patient denies having to give up anything due to his memory. He lives alone and completes ADL independently. He continues to do his own finances. He operates a Teacher, music without difficulty. Denies getting any accidents or getting lost. Daughter feels that memory has remained the same since the last visit. Daughter feels that his memory is better in the spring time and worse in the winter months. No new medical issues since last visit.   REVIEW OF SYSTEMS: Full 14 system review of systems performed and notable only for:  Constitutional: N/A  Eyes: N/A Ear/Nose/Throat: hearing loss Skin: N/A  Cardiovascular: N/A  Respiratory: N/A  Gastrointestinal: N/A  Genitourinary: N/A Hematology/Lymphatic: N/A  Endocrine: N/A Musculoskeletal:N/A  Allergy/Immunology: N/A  Neurological: N/A Psychiatric: N/A Sleep: N/A   ALLERGIES: Allergies  Allergen Reactions  . Quinidine Other (See Comments)    Chest Pain    HOME MEDICATIONS: Outpatient Prescriptions Prior to Visit  Medication Sig Dispense Refill  . acetaminophen (TYLENOL) 325 MG tablet Take 650 mg by mouth 2 (two) times daily.      Marland Kitchen allopurinol (ZYLOPRIM) 100 MG tablet TAKE 1 TABLET BY MOUTH AT BEDTIME.  30 tablet  2  . ALPRAZolam (XANAX) 0.5 MG tablet Take 1 tablet (0.5 mg total) by mouth 3 (three) times daily as needed for sleep or anxiety.  24 tablet  0  . Ascorbic Acid (VITAMIN C) 100 MG tablet Take 100 mg by mouth daily.      Marland Kitchen aspirin 81 MG tablet Take 81 mg by mouth every  morning.       . carvedilol (COREG) 6.25 MG tablet Take 1 tablet (6.25 mg total) by mouth 2 (two) times daily.  180 tablet  3  . donepezil (ARICEPT) 5 MG tablet TAKE 1 TABLET BY MOUTH AT BEDTIME  30 tablet  0  . enalapril (VASOTEC) 10 MG tablet Take 1 tablet (10 mg total) by mouth every morning.  30 tablet  5  . etodolac (LODINE) 400 MG tablet TAKE 1 TABLET TWICE DAILY WITH FOOD AS NEEDED FOR PAIN.  40 tablet  2  . ferrous sulfate 325 (65 FE) MG tablet Take 325 mg by mouth 2 (two) times daily.       . furosemide (LASIX) 40 MG tablet Take 1 tablet (40 mg total) by mouth daily.  30 tablet  5  . Lidocaine-Hydrocortisone Ace 2-2 % KIT as needed. Place cream in applicator and insert in rectum twice a day for 12 days.      . potassium chloride SA (K-DUR,KLOR-CON) 20 MEQ tablet Take 2 tablets (40 mEq total) by mouth every morning.  60 tablet  11  . Pyridoxine HCl (VITAMIN B-6 PO) Take 1 tablet by mouth daily.       . simvastatin (ZOCOR) 40 MG tablet Take 40 mg by mouth every evening.      . tamsulosin (FLOMAX) 0.4 MG CAPS capsule TAKE 1 CAPSULE BY MOUTH DAILY AFTER SUPPER  30 capsule  6  . carvedilol (COREG) 12.5 MG tablet TAKE 1 TABLET  BY MOUTH TWICE DAILY WITH A MEAL EMERGENCY REFILL FAXED DR  60 tablet  6   No facility-administered medications prior to visit.    PAST MEDICAL HISTORY: Past Medical History  Diagnosis Date  . Essential hypertension, benign   . Anemia   . Gout   . ED (erectile dysfunction)   . Mixed hyperlipidemia   . Coronary atherosclerosis of native coronary artery     Multivessel status post CABG  . Impaired fasting glucose   . Insomnia   . CTS (carpal tunnel syndrome)   . Chronic systolic heart failure   . Myocardial infarction 1985  . Ischemic cardiomyopathy     LVEF 30% with severe LVH  . DDD (degenerative disc disease), lumbar   . CHF (congestive heart failure)   . Hearing difficulty   . Dementia   . Hemorrhoids     PAST SURGICAL HISTORY: Past Surgical  History  Procedure Laterality Date  . Appendectomy    . Prostatectomy    . Cholecystectomy    . Tonsillectomy    . Colonoscopy    . Coronary artery bypass graft  2004    Dr. Roxy Manns - LIMA to LAD, SVG to circumflex, SVG to RCA  . Hand surgery Bilateral     Dupuytren's contracture repair bilaterally, right carpal tunnel syndrome surgery  . Nasal sinus surgery    . Colonoscopy  2003    Dr. Lindalou Hose: adenomatous polyp  . Colonoscopy  2010    Dr. Lindalou Hose: tubular adenoma, hyperplastic polyp. Surveillance requested for 2013  . Inguinal hernia repair Right   . Colonoscopy N/A 10/14/2013    Dr. Rourk:Multiple colonic polyps-removed as described above/Minimal internal hemorrhoids-cannot exclude small anal fissure. Tubular adenomas  . Hemorrhoid banding      FAMILY HISTORY: Family History  Problem Relation Age of Onset  . Dementia Father   . Stroke Father   . Rheum arthritis Mother   . Heart disease Mother   . Colon cancer Neg Hx     SOCIAL HISTORY: History   Social History  . Marital Status: Widowed    Spouse Name: N/A    Number of Children: 2  . Years of Education: [place]   Occupational History  . Retired    Social History Main Topics  . Smoking status: Former Smoker -- 3.00 packs/day for 30 years    Types: Cigarettes    Quit date: 10/15/1983  . Smokeless tobacco: Never Used  . Alcohol Use: No  . Drug Use: No  . Sexual Activity: Yes    Birth Control/ Protection: None   Other Topics Concern  . Not on file   Social History Narrative   Patient lives at home alone.    Patient has 2 children.    Patient has a 11th grade education.    Patient is retired.    Patient is widowed.    Patient is right handed.       PHYSICAL EXAM  Filed Vitals:   12/30/13 1028  BP: 131/60  Pulse: 68  Height: $Remove'5\' 8"'ACyAmKW$  (1.727 m)  Weight: 153 lb (69.4 kg)   Body mass index is 23.27 kg/(m^2).  Generalized: Well developed, in no acute distress   Neurological examination    Mentation: Alert oriented to time, place, history taking. Follows all commands speech and language fluent. MMSE 21/30 Cranial nerve II-XII:  Extraocular movements were full, visual field were full on confrontational test. Motor: The motor testing reveals 5 over 5 strength of all 4 extremities. Good  symmetric motor tone is noted throughout.  Sensory: Sensory testing is intact to soft touch on all 4 extremities. No evidence of extinction is noted.  Coordination: Cerebellar testing reveals good finger-nose-finger and heel-to-shin bilaterally.  Gait and station: Gait is normal. Tandem gait is normal. Romberg is negative. No drift is seen.  Reflexes: Deep tendon reflexes are symmetric and normal bilaterally.     DIAGNOSTIC DATA (LABS, IMAGING, TESTING) - I reviewed patient records, labs, notes, testing and imaging myself where available.  Lab Results  Component Value Date   WBC 5.5 05/17/2013   HGB 9.1* 05/17/2013   HCT 27.3* 05/17/2013   MCV 86.1 05/17/2013   PLT 147* 05/17/2013      Component Value Date/Time   NA 144 10/10/2013 0823   K 4.0 10/10/2013 0823   CL 107 10/10/2013 0823   CO2 28 10/10/2013 0823   GLUCOSE 97 10/10/2013 0823   BUN 38* 10/10/2013 0823   CREATININE 1.65* 10/10/2013 0823   CREATININE 1.46* 03/30/2013 0701   CALCIUM 9.1 10/10/2013 0823   PROT 5.9* 10/10/2013 0823   ALBUMIN 4.1 10/10/2013 0823   AST 19 10/10/2013 0823   ALT 12 10/10/2013 0823   ALKPHOS 73 10/10/2013 0823   BILITOT 0.6 10/10/2013 0823   GFRNONAA 42* 03/30/2013 0701   GFRAA 49* 03/30/2013 0701   Lab Results  Component Value Date   CHOL 136 10/10/2013   HDL 44 10/10/2013   LDLCALC 63 10/10/2013   TRIG 143 10/10/2013   CHOLHDL 3.1 10/10/2013   No results found for this basename: HGBA1C   Lab Results  Component Value Date   VITAMINB12 266 03/29/2013   Lab Results  Component Value Date   TSH 2.102 05/17/2013      ASSESSMENT AND PLAN 80 y.o. year old male  has a past medical history of Essential  hypertension, benign; Anemia; Gout; ED (erectile dysfunction); Mixed hyperlipidemia; Coronary atherosclerosis of native coronary artery; Impaired fasting glucose; Insomnia; CTS (carpal tunnel syndrome); Chronic systolic heart failure; Myocardial infarction (1985); Ischemic cardiomyopathy; DDD (degenerative disc disease), lumbar; CHF (congestive heart failure); Hearing difficulty; Dementia; and Hemorrhoids. here with 1. Short-term memory loss  Memory has remained stable. Will increase Aricept to 10 mg at bedtime.   Patient and family advised to be very cautious of his driving.  Follow-up in 6 months or sooner if needed.  The next visit will be in the winter months, if memory has declined we will look for underlying depression since the daughter reports that his memory is better during the spring and summer months.   Ward Givens, MSN, NP-C 12/30/2013, 10:35 AM [facility] [address], [place], [place] [address] [phone]  Note: This document was prepared with digital dictation and possible smart phrase technology. Any transcriptional errors that result from this process are unintentional.

## 2013-12-31 ENCOUNTER — Encounter: Payer: Medicare Other | Admitting: Internal Medicine

## 2014-01-08 ENCOUNTER — Other Ambulatory Visit: Payer: Self-pay | Admitting: Family Medicine

## 2014-01-08 ENCOUNTER — Ambulatory Visit: Payer: Medicare Other | Admitting: Family Medicine

## 2014-01-08 NOTE — Telephone Encounter (Signed)
Last seen 11/2013

## 2014-01-14 ENCOUNTER — Encounter: Payer: Self-pay | Admitting: Internal Medicine

## 2014-01-14 ENCOUNTER — Ambulatory Visit (INDEPENDENT_AMBULATORY_CARE_PROVIDER_SITE_OTHER): Payer: Medicare Other | Admitting: Internal Medicine

## 2014-01-14 VITALS — BP 132/61 | HR 68 | Temp 97.3°F | Resp 18 | Ht 70.0 in | Wt 153.0 lb

## 2014-01-14 DIAGNOSIS — K648 Other hemorrhoids: Secondary | ICD-10-CM

## 2014-01-14 NOTE — Progress Notes (Signed)
[place] banding procedure note:  The patient presents with symptomatic; status post banding of the right posterior and neutral position; overall, patient states a burning, pain, pressure have improved since the first 2 banding sessions. He is using Benefiber daily. He is desirous of a third band placed today.; All risks, benefits, and alternative forms of therapy were described and informed consent was obtained.  In the left lateral decubitus position, digital rectal exam revealed no abnormalities. The decision was made to band the left lateral internal hemorrhoid; the [place] was used to perform band ligation without complication. Digital anorectal examination was then performed to assure proper positioning of the band;  There was a slight amount of pain with the deployment. Followup digital exam revealed slight tethering. The band was loosened to an appropriate level of snugness. There was no pinching or pain after band was loosened up.  The patient was discharged home without pain or other issues. Dietary and behavioral recommendations were given along with follow-up instructions. The patient will   No complications were encountered and the patient tolerated the procedure well.

## 2014-01-14 NOTE — Patient Instructions (Signed)
Avoid straining.  Benefiber 2 teaspoons twice daily  Limit toilet time to 2-3 minutes  Call with any interim problems  Schedule followup appointment in 3 months from now

## 2014-03-04 ENCOUNTER — Encounter: Payer: Self-pay | Admitting: Family Medicine

## 2014-03-04 ENCOUNTER — Ambulatory Visit (INDEPENDENT_AMBULATORY_CARE_PROVIDER_SITE_OTHER): Payer: Medicare Other | Admitting: Family Medicine

## 2014-03-04 VITALS — BP 138/78 | Ht 69.0 in | Wt 153.0 lb

## 2014-03-04 DIAGNOSIS — N400 Enlarged prostate without lower urinary tract symptoms: Secondary | ICD-10-CM

## 2014-03-04 DIAGNOSIS — E782 Mixed hyperlipidemia: Secondary | ICD-10-CM

## 2014-03-04 DIAGNOSIS — K6289 Other specified diseases of anus and rectum: Secondary | ICD-10-CM

## 2014-03-04 DIAGNOSIS — I5022 Chronic systolic (congestive) heart failure: Secondary | ICD-10-CM

## 2014-03-04 DIAGNOSIS — I2510 Atherosclerotic heart disease of native coronary artery without angina pectoris: Secondary | ICD-10-CM

## 2014-03-04 DIAGNOSIS — R413 Other amnesia: Secondary | ICD-10-CM

## 2014-03-04 DIAGNOSIS — I10 Essential (primary) hypertension: Secondary | ICD-10-CM

## 2014-03-04 MED ORDER — LIDOCAINE-HYDROCORTISONE ACE 2-2 % RE KIT: PACK | RECTAL | Status: DC

## 2014-03-04 NOTE — Progress Notes (Signed)
   Subjective:    Patient ID: Evan Melendez, male    DOB: 13-Apr-1926, 80 y.o.   MRN: [id number]  HPI Patient is here today b/c his hemorrhoids are bothering him again for the past 3 days.  Using the ointment. Reports no blood in his stool. Please see prior notes. Patient has had an extensive evaluation of this region. He continues to have many symptoms potentially complicated by his thought patterns.  History of dementia. Clinically stable. Plan start checking in on him. Family is also watching close.  History of coronary artery disease. Clinically stable. No pain.  Claims compliance with her lipid medication.  Trying to watch sugar in his diet.      Review of Systems No current chest pain headache no nausea no abdominal pain no change in bowel habits no blood in stool ROS otherwise negative    Objective:   Physical Exam Alert no apparent distress. Vitals stable. Lungs clear. Heart regular in rhythm ankles without edema. Rectal exam no abnormalities       Assessment & Plan:  Impression 1 hypertension good control #2 hyperlipidemia status uncertain. #3 dementia stable patient is still living in the community. Family watches him closely. #4 perirectal concerns none evident on exam. Plan maintain same symptomatic care. Appropriate blood work. Diet exercise discussed. Check every 6 months. WSL

## 2014-03-13 ENCOUNTER — Ambulatory Visit (INDEPENDENT_AMBULATORY_CARE_PROVIDER_SITE_OTHER): Payer: Medicare Other | Admitting: Family Medicine

## 2014-03-13 ENCOUNTER — Encounter: Payer: Self-pay | Admitting: Cardiology

## 2014-03-13 ENCOUNTER — Ambulatory Visit (INDEPENDENT_AMBULATORY_CARE_PROVIDER_SITE_OTHER): Payer: Medicare Other | Admitting: Cardiology

## 2014-03-13 ENCOUNTER — Encounter: Payer: Self-pay | Admitting: Family Medicine

## 2014-03-13 VITALS — BP 120/76 | Ht 69.0 in | Wt 153.0 lb

## 2014-03-13 VITALS — BP 158/58 | HR 62 | Ht 71.0 in | Wt 152.0 lb

## 2014-03-13 DIAGNOSIS — E782 Mixed hyperlipidemia: Secondary | ICD-10-CM

## 2014-03-13 DIAGNOSIS — I498 Other specified cardiac arrhythmias: Secondary | ICD-10-CM

## 2014-03-13 DIAGNOSIS — Z79899 Other long term (current) drug therapy: Secondary | ICD-10-CM

## 2014-03-13 DIAGNOSIS — I2510 Atherosclerotic heart disease of native coronary artery without angina pectoris: Secondary | ICD-10-CM

## 2014-03-13 DIAGNOSIS — R001 Bradycardia, unspecified: Secondary | ICD-10-CM

## 2014-03-13 DIAGNOSIS — I10 Essential (primary) hypertension: Secondary | ICD-10-CM

## 2014-03-13 MED ORDER — FEXOFENADINE HCL 180 MG PO TABS: 180.0000 mg | ORAL_TABLET | Freq: Every day | ORAL | Status: AC

## 2014-03-13 NOTE — Assessment & Plan Note (Signed)
Multivessel disease status post prior CABG with evidence of ischemic cardiomyopathy Plan is to continue conservative treatment, medical therapy and observation for now. Followup arranged.

## 2014-03-13 NOTE — Patient Instructions (Addendum)
Your physician recommends that you schedule a follow-up appointment in: 4 months   Your physician recommends that you continue on your current medications as directed. Please refer to the Current Medication list given to you today.        Thank you for choosing [facility] !

## 2014-03-13 NOTE — Progress Notes (Signed)
Clinical Summary Mr. Ancheta is an 80 y.o.male last seen in May of this year. He is here with his daughter. Overall stable from a symptom perspective, no progressive shortness of breath or angina symptoms. He has had no dizziness or syncope. We reviewed his medications today. At the last visit, Coreg dose was cut back to try and avoid bradycardia.  Exercise Myoview from May 2014 demonstrated significant area of scar involving the inferior, inferoseptal, and inferoapical myocardium. No large ischemic zones noted and LVEF was 28%, consistent with recent echocardiogram. After discussions with the patient and family, we have been treating him conservatively with medical therapy, no plans for invasive testing or a device at this point.  Lipid panel from March showed cholesterol 136, triglycerides 143, HDL 44, LDL 63.   Allergies  Allergen Reactions  . Quinidine Other (See Comments)    Chest Pain    Current Outpatient Prescriptions  Medication Sig Dispense Refill  . acetaminophen (TYLENOL) 325 MG tablet Take 650 mg by mouth 2 (two) times daily.      Marland Kitchen allopurinol (ZYLOPRIM) 100 MG tablet TAKE 1 TABLET BY MOUTH AT BEDTIME.  30 tablet  2  . Ascorbic Acid (VITAMIN C) 100 MG tablet Take 100 mg by mouth daily.      Marland Kitchen aspirin 81 MG tablet Take 81 mg by mouth every morning.       . carvedilol (COREG) 6.25 MG tablet Take 1 tablet (6.25 mg total) by mouth 2 (two) times daily.  180 tablet  3  . donepezil (ARICEPT) 10 MG tablet Take 1 tablet (10 mg total) by mouth at bedtime.  90 tablet  3  . enalapril (VASOTEC) 10 MG tablet Take 1 tablet (10 mg total) by mouth every morning.  30 tablet  5  . etodolac (LODINE) 400 MG tablet TAKE 1 TABLET TWICE DAILY WITH FOOD AS NEEDED FOR PAIN.  40 tablet  2  . ferrous sulfate 325 (65 FE) MG tablet Take 325 mg by mouth 2 (two) times daily.       . furosemide (LASIX) 40 MG tablet Take 1 tablet (40 mg total) by mouth daily.  30 tablet  5  . Lidocaine-Hydrocortisone Ace  2-2 % KIT INSERT RECTALLY TWICE DAILY  24 each  5  . potassium chloride SA (K-DUR,KLOR-CON) 20 MEQ tablet Take 2 tablets (40 mEq total) by mouth every morning.  60 tablet  11  . Pyridoxine HCl (VITAMIN B-6 PO) Take 1 tablet by mouth daily.       . simvastatin (ZOCOR) 40 MG tablet Take 40 mg by mouth every evening.      . tamsulosin (FLOMAX) 0.4 MG CAPS capsule TAKE 1 CAPSULE BY MOUTH DAILY AFTER SUPPER  30 capsule  6   No current facility-administered medications for this visit.    Past Medical History  Diagnosis Date  . Essential hypertension, benign   . Anemia   . Gout   . ED (erectile dysfunction)   . Mixed hyperlipidemia   . Coronary atherosclerosis of native coronary artery     Multivessel status post CABG  . Impaired fasting glucose   . Insomnia   . CTS (carpal tunnel syndrome)   . Chronic systolic heart failure   . Myocardial infarction 1985  . Ischemic cardiomyopathy     LVEF 30% with severe LVH  . DDD (degenerative disc disease), lumbar   . CHF (congestive heart failure)   . Hearing difficulty   . Dementia   . Hemorrhoids  Past Surgical History  Procedure Laterality Date  . Appendectomy    . Prostatectomy    . Cholecystectomy    . Tonsillectomy    . Colonoscopy    . Coronary artery bypass graft  2004    Dr. Roxy Manns - LIMA to LAD, SVG to circumflex, SVG to RCA  . Hand surgery Bilateral     Dupuytren's contracture repair bilaterally, right carpal tunnel syndrome surgery  . Nasal sinus surgery    . Colonoscopy  2003    Dr. Lindalou Hose: adenomatous polyp  . Colonoscopy  2010    Dr. Lindalou Hose: tubular adenoma, hyperplastic polyp. Surveillance requested for 2013  . Inguinal hernia repair Right   . Colonoscopy N/A 10/14/2013    Dr. Rourk:Multiple colonic polyps-removed as described above/Minimal internal hemorrhoids-cannot exclude small anal fissure. Tubular adenomas  . Hemorrhoid banding      Social History Mr. Kenton reports that he quit smoking about 30 years  ago. His smoking use included Cigarettes. He has a 90 pack-year smoking history. He has never used smokeless tobacco. Mr. Knipple reports that he does not drink alcohol.  Review of Systems Appetite is stable. No bleeding problems. No reported falls. Still trouble with memory/dementia. Other systems reviewed and negative except as outlined.  Physical Examination Filed Vitals:   03/13/14 0921  BP: 158/58  Pulse: 62   Filed Weights   03/13/14 0921  Weight: 152 lb (68.947 kg)    Comfortable at rest.  HEENT: Conjunctiva and lids normal, oropharynx clear.  Neck: Supple, no elevated JVP, no thyromegaly.  Lungs: Diminished but clear to auscultation, nonlabored breathing at rest.  Cardiac: Regular rate and rhythm, no S3 or significant systolic murmur, no pericardial rub.  Abdomen: Soft, nontender,bowel sounds present.  Extremities: No pitting edema, distal pulses 1-2+.  Skin: Warm and dry, scattered ecchymoses.  Musculoskeletal: No kyphosis.  Neuropsychiatric: Alert and oriented x3, calm.   Problem List and Plan   Chronic systolic heart failure Symptomatically stable, no change in weight. Continue current regimen.  Coronary atherosclerosis of native coronary artery Multivessel disease status post prior CABG with evidence of ischemic cardiomyopathy Plan is to continue conservative treatment, medical therapy and observation for now. Followup arranged.  Essential hypertension, benign Blood pressure up today. Daughter plans to get blood pressure cuff to check at home.  Bradycardia Asymptomatic, tolerating current dose of Coreg.    Satira Sark, M.D., F.A.C.C.

## 2014-03-13 NOTE — Assessment & Plan Note (Signed)
Symptomatically stable, no change in weight. Continue current regimen.

## 2014-03-13 NOTE — Assessment & Plan Note (Signed)
Asymptomatic, tolerating current dose of Coreg.

## 2014-03-13 NOTE — Progress Notes (Signed)
   Subjective:    Patient ID: Evan Melendez, male    DOB: 02-26-1926, 79 y.o.   MRN: [id number]  Hypertension This is a chronic problem. The current episode started more than 1 year ago. The problem is unchanged. The problem is controlled. There are no associated agents to hypertension. There are no known risk factors for coronary artery disease. Treatments tried: enalapril, carvedilol. The current treatment provides significant improvement. There are no compliance problems.   Patient states his hemorrhoids have flared up again. Currently on treatment for this.  Patient continues to see specialists for dementia. Currently on medication. Claims he is tolerating medicine well.  Known coronary artery disease. Compliant with lipid medication. No obvious side effects. No recent blood work in this regard. Just saw a cardiologist.  Review of Systems No chest pain no headache no back pain no abdominal pain no change in bowel habits no blood in stool ROS otherwise negative    Objective:   Physical Exam  Alert no apparent distress. Talkative. Vital stable. Lungs clear. Heart rare rhythm. Ankles without edema. Neuro exam intact.      Assessment & Plan:  Impression #1 hypertension good control. #2 hyperlipidemia status uncertain. #3 coronary artery disease discussed. #4 early dementia followed by specialist. Of note family checks on her frequently. Plan maintain same meds. Diet exercise discussed. Followup as scheduled. Appropriate blood work. WSL

## 2014-03-13 NOTE — Assessment & Plan Note (Signed)
Blood pressure up today. Daughter plans to get blood pressure cuff to check at home.

## 2014-03-14 LAB — BASIC METABOLIC PANEL
BUN: 26 mg/dL — ABNORMAL HIGH (ref 6–23)
CALCIUM: 9.1 mg/dL (ref 8.4–10.5)
CHLORIDE: 107 meq/L (ref 96–112)
CO2: 28 mEq/L (ref 19–32)
Creat: 1.45 mg/dL — ABNORMAL HIGH (ref 0.50–1.35)
Glucose, Bld: 111 mg/dL — ABNORMAL HIGH (ref 70–99)
Potassium: 4.1 mEq/L (ref 3.5–5.3)
Sodium: 144 mEq/L (ref 135–145)

## 2014-03-14 LAB — LIPID PANEL
CHOLESTEROL: 124 mg/dL (ref 0–200)
HDL: 51 mg/dL (ref >39)
LDL Cholesterol: 50 mg/dL (ref 0–99)
Total CHOL/HDL Ratio: 2.4 Ratio
Triglycerides: 115 mg/dL (ref <150)
VLDL: 23 mg/dL (ref 0–40)

## 2014-03-14 LAB — HEPATIC FUNCTION PANEL
ALK PHOS: 72 U/L (ref 39–117)
ALT: 11 U/L (ref 0–53)
AST: 18 U/L (ref 0–37)
Albumin: 4.4 g/dL (ref 3.5–5.2)
Bilirubin, Direct: 0.2 mg/dL (ref 0.0–0.3)
Indirect Bilirubin: 0.5 mg/dL (ref 0.2–1.2)
Total Bilirubin: 0.7 mg/dL (ref 0.2–1.2)
Total Protein: 6.4 g/dL (ref 6.0–8.3)

## 2014-03-16 ENCOUNTER — Encounter: Payer: Self-pay | Admitting: Family Medicine

## 2014-03-22 ENCOUNTER — Other Ambulatory Visit: Payer: Self-pay | Admitting: Family Medicine

## 2014-03-25 ENCOUNTER — Encounter: Payer: Self-pay | Admitting: Internal Medicine

## 2014-05-08 ENCOUNTER — Ambulatory Visit (INDEPENDENT_AMBULATORY_CARE_PROVIDER_SITE_OTHER): Payer: Medicare Other | Admitting: Family Medicine

## 2014-05-08 ENCOUNTER — Encounter: Payer: Self-pay | Admitting: Family Medicine

## 2014-05-08 VITALS — BP 120/70 | Ht 69.0 in | Wt 158.2 lb

## 2014-05-08 DIAGNOSIS — I491 Atrial premature depolarization: Secondary | ICD-10-CM

## 2014-05-08 DIAGNOSIS — I10 Essential (primary) hypertension: Secondary | ICD-10-CM

## 2014-05-08 DIAGNOSIS — Z23 Encounter for immunization: Secondary | ICD-10-CM

## 2014-05-08 DIAGNOSIS — I2510 Atherosclerotic heart disease of native coronary artery without angina pectoris: Secondary | ICD-10-CM

## 2014-05-08 DIAGNOSIS — I499 Cardiac arrhythmia, unspecified: Secondary | ICD-10-CM

## 2014-05-08 NOTE — Progress Notes (Signed)
   Subjective:    Patient ID: Evan Melendez, male    DOB: 03/13/1926, 80 y.o.   MRN: [id number]  Hypertension This is a chronic problem. The problem is unchanged. The problem is controlled. Pertinent negatives include no chest pain. There are no associated agents to hypertension. There are no known risk factors for coronary artery disease. Treatments tried: carvedilol. The current treatment provides significant improvement.  Patient had some high readings on his blood pressure because his memory is impaired and he had missed a few days of taking his BP medication. He is here today just to recheck and make sure it is normal.   EKG was ordered because his heart was slightly irregular  Review of Systems  Constitutional: Negative for activity change, appetite change and fatigue.  HENT: Negative for congestion.   Respiratory: Negative for cough.   Cardiovascular: Negative for chest pain.  Gastrointestinal: Negative for abdominal pain.  Endocrine: Negative for polydipsia and polyphagia.  Neurological: Negative for weakness.  Psychiatric/Behavioral: Negative for confusion.       Objective:   Physical Exam  Vitals reviewed. Constitutional: He appears well-nourished. No distress.  Cardiovascular: Normal rate and normal heart sounds.   No murmur heard. Pulmonary/Chest: Effort normal and breath sounds normal. No respiratory distress.  Musculoskeletal: He exhibits no edema.  Lymphadenopathy:    He has no cervical adenopathy.  Neurological: He is alert.  Psychiatric: His behavior is normal.     EKG shows an occasional PAC     Assessment & Plan:  HTN-he was encouraged to take his medicines on regular basis. He states his daughter helps but his medicines into containers. He was encouraged to incorporate some sort of process that will trigger him to take his medicines. He has a followup visit in 4 weeks keep this.

## 2014-05-14 ENCOUNTER — Other Ambulatory Visit: Payer: Self-pay | Admitting: Family Medicine

## 2014-05-21 ENCOUNTER — Other Ambulatory Visit: Payer: Self-pay | Admitting: Family Medicine

## 2014-06-09 ENCOUNTER — Ambulatory Visit (INDEPENDENT_AMBULATORY_CARE_PROVIDER_SITE_OTHER): Payer: Medicare Other | Admitting: Family Medicine

## 2014-06-09 ENCOUNTER — Encounter: Payer: Self-pay | Admitting: Family Medicine

## 2014-06-09 VITALS — BP 132/86 | Ht 69.0 in | Wt 156.0 lb

## 2014-06-09 DIAGNOSIS — E782 Mixed hyperlipidemia: Secondary | ICD-10-CM

## 2014-06-09 DIAGNOSIS — I2510 Atherosclerotic heart disease of native coronary artery without angina pectoris: Secondary | ICD-10-CM

## 2014-06-09 DIAGNOSIS — N400 Enlarged prostate without lower urinary tract symptoms: Secondary | ICD-10-CM

## 2014-06-09 DIAGNOSIS — I10 Essential (primary) hypertension: Secondary | ICD-10-CM

## 2014-06-09 NOTE — Progress Notes (Signed)
   Subjective:    Patient ID: Evan Melendez, male    DOB: 27-Jan-1926, 80 y.o.   MRN: [id number]  Hypertension This is a chronic problem. The current episode started more than 1 year ago. Treatments tried: enalapril. There are no compliance problems.   Walks every morning at the [employer].    Concerns about hemmorroids. No gross bleeding. Some irritation. Would like his hemorrhoids checked. Had hemorrhoids banded earlier this spring.  Compliant with lipid medicine. No obvious side effects.  History of dementia. Family checks in frequently. Patient still driving.only during the day. Patient states he has not become lost. He purposefully drives slow.  Patient states arthritis overall stable.  No chest pain.   Review of Systems No shortness breath no chest pain no headache no back pain no abdominal pain ROS otherwise negative    Objective:   Physical Exam  Alert vital stable blood pressure good HEENT normal. Lungs clear. Heart regular in rhythm. Perirectal region no obvious abnormality. Prostate normal.      Assessment & Plan:  Impression 1 hypertension good control. #2 hyperlipidemia discuss. #3 dementia stable per patient claims compliance with medications. Close family observance #4 coronary artery disease stable #5 perceived hemorrhoid irritation plan perirectal suppositories as needed. Maintain other medications. Diet exercise discussed. Recheck in several months. WSL

## 2014-06-16 ENCOUNTER — Other Ambulatory Visit: Payer: Self-pay | Admitting: Family Medicine

## 2014-07-02 ENCOUNTER — Encounter: Payer: Self-pay | Admitting: Adult Health

## 2014-07-02 ENCOUNTER — Ambulatory Visit (INDEPENDENT_AMBULATORY_CARE_PROVIDER_SITE_OTHER): Payer: Medicare Other | Admitting: Adult Health

## 2014-07-02 VITALS — BP 156/72 | HR 61 | Temp 98.0°F | Ht 69.0 in | Wt 156.0 lb

## 2014-07-02 DIAGNOSIS — I2510 Atherosclerotic heart disease of native coronary artery without angina pectoris: Secondary | ICD-10-CM

## 2014-07-02 DIAGNOSIS — R413 Other amnesia: Secondary | ICD-10-CM

## 2014-07-02 NOTE — Progress Notes (Signed)
PATIENT: Evan Melendez DOB: 1926-01-25  REASON FOR VISIT: follow up HISTORY FROM: patient  HISTORY OF PRESENT ILLNESS: Evan Melendez is an 80 year old male with a history of mild memory disorder. He returns today for follow-up. He is currently taking Aricept and tolerating it well. The patient feels that his memory has remained the same. He denies any left anything due to his memory. He continues to live alone and can complete all ADLs independently. He operates a Teacher, music without difficulty. No new medical issues since last seen.  HISTORY 12/30/13: 80 year old right-handed white male with a history of memory deficit. He returns today for follow-up. The patient was placed on Aricept at the last visit and states that he is tolerating it well. Patient feels that he does not have an issue with his memory. Daughter states that he will continuously repeat himself. Patient denies having to give up anything due to his memory. He lives alone and completes ADL independently. He continues to do his own finances. He operates a Teacher, music without difficulty. Denies getting any accidents or getting lost. Daughter feels that memory has remained the same since the last visit. Daughter feels that his memory is better in the spring time and worse in the winter months. No new medical issues since last visit.  REVIEW OF SYSTEMS: Out of a complete 14 system review of symptoms, the patient complains only of the following symptoms, and all other reviewed systems are negative.  Back pain  ALLERGIES: Allergies  Allergen Reactions  . Quinidine Other (See Comments)    Chest Pain    HOME MEDICATIONS: Outpatient Prescriptions Prior to Visit  Medication Sig Dispense Refill  . acetaminophen (TYLENOL) 325 MG tablet Take 650 mg by mouth 2 (two) times daily.    Marland Kitchen allopurinol (ZYLOPRIM) 100 MG tablet TAKE 1 TABLET BY MOUTH AT BEDTIME. 30 tablet 2  . Ascorbic Acid (VITAMIN C) 100 MG tablet Take 100 mg by mouth  daily.    Marland Kitchen aspirin 81 MG tablet Take 81 mg by mouth every morning.     . carvedilol (COREG) 6.25 MG tablet Take 1 tablet (6.25 mg total) by mouth 2 (two) times daily. 180 tablet 3  . donepezil (ARICEPT) 10 MG tablet Take 1 tablet (10 mg total) by mouth at bedtime. 90 tablet 3  . enalapril (VASOTEC) 10 MG tablet Take 1 tablet (10 mg total) by mouth every morning. 30 tablet 5  . etodolac (LODINE) 400 MG tablet TAKE 1 TABLET TWICE DAILY WITH FOOD AS NEEDED FOR PAIN. 40 tablet 2  . ferrous sulfate 325 (65 FE) MG tablet Take 325 mg by mouth 2 (two) times daily.     . fexofenadine (ALLEGRA) 180 MG tablet Take 1 tablet (180 mg total) by mouth daily. 30 tablet 5  . furosemide (LASIX) 40 MG tablet TAKE 1 TABLET BY MOUTH DAILY. 30 tablet 5  . potassium chloride SA (K-DUR,KLOR-CON) 20 MEQ tablet TAKE 2 TABLETS BY MOUTH EVERY MORNING 60 tablet 5  . Pyridoxine HCl (VITAMIN B-6 PO) Take 1 tablet by mouth daily.     . simvastatin (ZOCOR) 40 MG tablet TAKE 1 TABLET BY MOUTH AT BEDTIME 30 tablet 5  . tamsulosin (FLOMAX) 0.4 MG CAPS capsule TAKE 1 CAPSULE BY MOUTH DAILY AFTER SUPPER 30 capsule 6  . Lidocaine-Hydrocortisone Ace 2-2 % KIT INSERT RECTALLY TWICE DAILY 24 each 5   No facility-administered medications prior to visit.    PAST MEDICAL HISTORY: Past Medical History  Diagnosis Date  .  Essential hypertension, benign   . Anemia   . Gout   . ED (erectile dysfunction)   . Mixed hyperlipidemia   . Coronary atherosclerosis of native coronary artery     Multivessel status post CABG  . Impaired fasting glucose   . Insomnia   . CTS (carpal tunnel syndrome)   . Chronic systolic heart failure   . Myocardial infarction 1985  . Ischemic cardiomyopathy     LVEF 30% with severe LVH  . DDD (degenerative disc disease), lumbar   . CHF (congestive heart failure)   . Hearing difficulty   . Dementia   . Hemorrhoids     PAST SURGICAL HISTORY: Past Surgical History  Procedure Laterality Date  .  Appendectomy    . Prostatectomy    . Cholecystectomy    . Tonsillectomy    . Colonoscopy    . Coronary artery bypass graft  2004    Dr. Roxy Manns - LIMA to LAD, SVG to circumflex, SVG to RCA  . Hand surgery Bilateral     Dupuytren's contracture repair bilaterally, right carpal tunnel syndrome surgery  . Nasal sinus surgery    . Colonoscopy  2003    Dr. Lindalou Hose: adenomatous polyp  . Colonoscopy  2010    Dr. Lindalou Hose: tubular adenoma, hyperplastic polyp. Surveillance requested for 2013  . Inguinal hernia repair Right   . Colonoscopy N/A 10/14/2013    Dr. Rourk:Multiple colonic polyps-removed as described above/Minimal internal hemorrhoids-cannot exclude small anal fissure. Tubular adenomas  . Hemorrhoid banding      FAMILY HISTORY: Family History  Problem Relation Age of Onset  . Dementia Father   . Stroke Father   . Rheum arthritis Mother   . Heart disease Mother   . Colon cancer Neg Hx     SOCIAL HISTORY: History   Social History  . Marital Status: Widowed    Spouse Name: N/A    Number of Children: 2  . Years of Education: [place]   Occupational History  . Retired    Social History Main Topics  . Smoking status: Former Smoker -- 3.00 packs/day for 30 years    Types: Cigarettes    Quit date: 10/15/1983  . Smokeless tobacco: Never Used  . Alcohol Use: No  . Drug Use: No  . Sexual Activity: Yes    Birth Control/ Protection: None   Other Topics Concern  . Not on file   Social History Narrative   Patient lives at home alone.    Patient has 2 children.    Patient has a 11th grade education.    Patient is retired.    Patient is widowed.    Patient is right handed.       PHYSICAL EXAM  Filed Vitals:   07/02/14 1306  BP: 156/72  Pulse: 61  Temp: 98 F (36.7 C)  TempSrc: Oral  Height: $Remove'5\' 9"'qhdFMeH$  (1.753 m)  Weight: 156 lb (70.761 kg)   Body mass index is 23.03 kg/(m^2).  Generalized: Well developed, in no acute distress   Neurological examination    Mentation: Alert.Follows all commands speech and language fluent. MMSE 21/30 Cranial nerve II-XII: Pupils were equal round reactive to light. Extraocular movements were full, visual field were full on confrontational test. Facial sensation and strength were normal. Uvula tongue midline. Head turning and shoulder shrug  were normal and symmetric. Motor: The motor testing reveals 5 over 5 strength of all 4 extremities. Good symmetric motor tone is noted throughout.  Sensory: Sensory testing is  intact to soft touch on all 4 extremities. No evidence of extinction is noted.  Coordination: Cerebellar testing reveals good finger-nose-finger and heel-to-shin bilaterally.  Gait and station: Gait is normal. Tandem gait is slightly unsteady. Romberg is negative. No drift is seen.  Reflexes: Deep tendon reflexes are symmetric and normal bilaterally.   DIAGNOSTIC DATA (LABS, IMAGING, TESTING) - I reviewed patient records, labs, notes, testing and imaging myself where available.  Lab Results  Component Value Date   WBC 5.5 05/17/2013   HGB 9.1* 05/17/2013   HCT 27.3* 05/17/2013   MCV 86.1 05/17/2013   PLT 147* 05/17/2013      Component Value Date/Time   NA 144 03/14/2014 0930   K 4.1 03/14/2014 0930   CL 107 03/14/2014 0930   CO2 28 03/14/2014 0930   GLUCOSE 111* 03/14/2014 0930   BUN 26* 03/14/2014 0930   CREATININE 1.45* 03/14/2014 0930   CREATININE 1.46* 03/30/2013 0701   CALCIUM 9.1 03/14/2014 0930   PROT 6.4 03/14/2014 0930   ALBUMIN 4.4 03/14/2014 0930   AST 18 03/14/2014 0930   ALT 11 03/14/2014 0930   ALKPHOS 72 03/14/2014 0930   BILITOT 0.7 03/14/2014 0930   GFRNONAA 42* 03/30/2013 0701   GFRAA 49* 03/30/2013 0701   Lab Results  Component Value Date   CHOL 124 03/14/2014   HDL 51 03/14/2014   LDLCALC 50 03/14/2014   TRIG 115 03/14/2014   CHOLHDL 2.4 03/14/2014   No results found for: HGBA1C Lab Results  Component Value Date   VITAMINB12 266 03/29/2013   Lab Results   Component Value Date   TSH 2.102 05/17/2013      ASSESSMENT AND PLAN 80 y.o. year old male  has a past medical history of Essential hypertension, benign; Anemia; Gout; ED (erectile dysfunction); Mixed hyperlipidemia; Coronary atherosclerosis of native coronary artery; Impaired fasting glucose; Insomnia; CTS (carpal tunnel syndrome); Chronic systolic heart failure; Myocardial infarction (1985); Ischemic cardiomyopathy; DDD (degenerative disc disease), lumbar; CHF (congestive heart failure); Hearing difficulty; Dementia; and Hemorrhoids. here with:  1. Memory loss  Overall the patient is doing well. His MMSE today is 21/30. The patient will continue taking Aricept 10 mg at bedtime. In the future we may consider adding on Namenda. If the patient has any changes in his memory or experiences new symptoms he should let us know. Otherwise he will follow-up in 6 months or sooner if needed.  Ward Givens, MSN, NP-C 07/02/2014, 1:20 PM [facility] [address] [place], [place] [address] [phone]  Note: This document was prepared with digital dictation and possible smart phrase technology. Any transcriptional errors that result from this process are unintentional.

## 2014-07-02 NOTE — Progress Notes (Signed)
I have read the note, and I agree with the clinical assessment and plan.  Elivia Robotham KEITH   

## 2014-07-02 NOTE — Patient Instructions (Addendum)
Continue Aricept. If you notice any changes with your memory please let us know.   Dementia Dementia is a general term for problems with brain function. A person with dementia has memory loss and a hard time with at least one other brain function such as thinking, speaking, or problem solving. Dementia can affect social functioning, how you do your job, your mood, or your personality. The changes may be hidden for a long time. The earliest forms of this disease are usually not detected by family or friends. Dementia can be:  Irreversible.  Potentially reversible.  Partially reversible.  Progressive. This means it can get worse over time. CAUSES  Irreversible dementia causes may include:  Degeneration of brain cells (Alzheimer disease or Lewy body dementia).  Multiple small strokes (vascular dementia).  Infection (chronic meningitis or Creutzfeldt-Jakob disease).  Frontotemporal dementia. This affects younger people, age 68 to 71, compared to those who have Alzheimer disease.  Dementia associated with other disorders like Parkinson disease, Huntington disease, or HIV-associated dementia. Potentially or partially reversible dementia causes may include:  Medicines.  Metabolic causes such as excessive alcohol intake, vitamin B12 deficiency, or thyroid disease.  Masses or pressure in the brain such as a tumor, blood clot, or hydrocephalus. SIGNS AND SYMPTOMS  Symptoms are often hard to detect. Family members or coworkers may not notice them early in the disease process. Different people with dementia may have different symptoms. Symptoms can include:  A hard time with memory, especially recent memory. Long-term memory may not be impaired.  Asking the same question multiple times or forgetting something someone just said.  A hard time speaking your thoughts or finding certain words.  A hard time solving problems or performing familiar tasks (such as how to use a  telephone).  Sudden changes in mood.  Changes in personality, especially increasing moodiness or mistrust.  Depression.  A hard time understanding complex ideas that were never a problem in the past. DIAGNOSIS  There are no specific tests for dementia.   Your health care provider may recommend a thorough evaluation. This is because some forms of dementia can be reversible. The evaluation will likely include a physical exam and getting a detailed history from you and a family member. The history often gives the best clues and suggestions for a diagnosis.  Memory testing may be done. A detailed brain function evaluation called neuropsychologic testing may be helpful.  Lab tests and brain imaging (such as a CT scan or MRI scan) are sometimes important.  Sometimes observation and re-evaluation over time is very helpful. TREATMENT  Treatment depends on the cause.   If the problem is a vitamin deficiency, it may be helped or cured with supplements.  For dementias such as Alzheimer disease, medicines are available to stabilize or slow the course of the disease. There are no cures for this type of dementia.  Your health care provider can help direct you to groups, organizations, and other health care providers to help with decisions in the care of you or your loved one. HOME CARE INSTRUCTIONS The care of individuals with dementia is varied and dependent upon the progression of the dementia. The following suggestions are intended for the person living with, or caring for, the person with dementia.  Create a safe environment.  Remove the locks on bathroom doors to prevent the person from accidentally locking himself or herself in.  Use childproof latches on kitchen cabinets and any place where cleaning supplies, chemicals, or alcohol are kept.  Use childproof covers in unused electrical outlets.  Install childproof devices to keep doors and windows secured.  Remove stove knobs or  install safety knobs and an automatic shut-off on the stove.  Lower the temperature on water heaters.  Label medicines and keep them locked up.  Secure knives, lighters, matches, power tools, and guns, and keep these items out of reach.  Keep the house free from clutter. Remove rugs or anything that might contribute to a fall.  Remove objects that might break and hurt the person.  Make sure lighting is good, both inside and outside.  Install grab rails as needed.  Use a monitoring device to alert you to falls or other needs for help.  Reduce confusion.  Keep familiar objects and people around.  Use night lights or dim lights at night.  Label items or areas.  Use reminders, notes, or directions for daily activities or tasks.  Keep a simple, consistent routine for waking, meals, bathing, dressing, and bedtime.  Create a calm, quiet environment.  Place large clocks and calendars prominently.  Display emergency numbers and home address near all telephones.  Use cues to establish different times of the day. An example is to open curtains to let the natural light in during the day.   Use effective communication.  Choose simple words and short sentences.  Use a gentle, calm tone of voice.  Be careful not to interrupt.  If the person is struggling to find a word or communicate a thought, try to provide the word or thought.  Ask one question at a time. Allow the person ample time to answer questions. Repeat the question again if the person does not respond.  Reduce nighttime restlessness.  Provide a comfortable bed.  Have a consistent nighttime routine.  Ensure a regular walking or physical activity schedule. Involve the person in daily activities as much as possible.  Limit napping during the day.  Limit caffeine.  Attend social events that stimulate rather than overwhelm the senses.  Encourage good nutrition and hydration.  Reduce distractions during meal  times and snacks.  Avoid foods that are too hot or too cold.  Monitor chewing and swallowing ability.  Continue with routine vision, hearing, dental, and medical screenings.  Give medicines only as directed by the health care provider.  Monitor driving abilities. Do not allow the person to drive when safe driving is no longer possible.  Register with an identification program which could provide location assistance in the event of a missing person situation. SEEK MEDICAL CARE IF:   New behavioral problems start such as moodiness, aggressiveness, or seeing things that are not there (hallucinations).  Any new problem with brain function happens. This includes problems with balance, speech, or falling a lot.  Problems with swallowing develop.  Any symptoms of other illness happen. Small changes or worsening in any aspect of brain function can be a sign that the illness is getting worse. It can also be a sign of another medical illness such as infection. Seeing a health care provider right away is important. SEEK IMMEDIATE MEDICAL CARE IF:   A fever develops.  New or worsened confusion develops.  New or worsened sleepiness develops.  Staying awake becomes hard to do. Document Released: 01/04/2001 Document Revised: 11/25/2013 Document Reviewed: 12/06/2010 [facility] Patient Information 2015 [place], [place]. This information is not intended to replace advice given to you by your health care provider. Make sure you discuss any questions you have with your health care provider.

## 2014-07-11 ENCOUNTER — Encounter: Payer: Self-pay | Admitting: Cardiology

## 2014-07-11 ENCOUNTER — Ambulatory Visit (INDEPENDENT_AMBULATORY_CARE_PROVIDER_SITE_OTHER): Payer: Medicare Other | Admitting: Cardiology

## 2014-07-11 VITALS — BP 140/72 | HR 65 | Ht 69.0 in | Wt 159.0 lb

## 2014-07-11 DIAGNOSIS — I255 Ischemic cardiomyopathy: Secondary | ICD-10-CM

## 2014-07-11 DIAGNOSIS — I10 Essential (primary) hypertension: Secondary | ICD-10-CM

## 2014-07-11 DIAGNOSIS — I2510 Atherosclerotic heart disease of native coronary artery without angina pectoris: Secondary | ICD-10-CM

## 2014-07-11 DIAGNOSIS — E782 Mixed hyperlipidemia: Secondary | ICD-10-CM

## 2014-07-11 NOTE — Assessment & Plan Note (Signed)
Continue conservative medical therapy as outlined above. 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 No change made in medical regimen.

## 2014-07-11 NOTE — Progress Notes (Signed)
Reason for visit: CAD, cardiomyopathy  Clinical Summary Mr. Zarrella is an 80 y.o.male last seen in August. He is here with his daughter today for a routine visit. From a cardiac perspective, he has had no decline in shortness of breath, still walks at the [facility] and the mornings. No chest pain symptoms. We reviewed his medications and he continues on aspirin, Coreg, Vasotec, Lasix with potassium supplements, and Zocor.  Exercise Myoview from May 2014 demonstrated significant area of scar involving the inferior, inferoseptal, and inferoapical myocardium. No large ischemic zones noted and LVEF was 28%, consistent with echocardiogram. After discussions with the patient and family, we have been treating him conservatively with medical therapy, no plans for invasive testing or a device at this point.  Lipid panel from August showed cholesterol 124, triglycerides 115, HDL 51, LDL 50. He continues on Zocor.  Weight relatively stable over the last few months. He reports stable appetite. Has had some lower abdominal discomfort and loose stools just recently. No fevers or chills. Continues to follow with Dr. Wolfgang Phoenix.  Allergies  Allergen Reactions  . Quinidine Other (See Comments)    Chest Pain    Current Outpatient Prescriptions  Medication Sig Dispense Refill  . acetaminophen (TYLENOL) 325 MG tablet Take 650 mg by mouth 2 (two) times daily.    Marland Kitchen allopurinol (ZYLOPRIM) 100 MG tablet TAKE 1 TABLET BY MOUTH AT BEDTIME. 30 tablet 2  . Ascorbic Acid (VITAMIN C) 100 MG tablet Take 100 mg by mouth daily.    Marland Kitchen aspirin 81 MG tablet Take 81 mg by mouth every morning.     . carvedilol (COREG) 6.25 MG tablet Take 1 tablet (6.25 mg total) by mouth 2 (two) times daily. 180 tablet 3  . donepezil (ARICEPT) 10 MG tablet Take 1 tablet (10 mg total) by mouth at bedtime. 90 tablet 3  . enalapril (VASOTEC) 10 MG tablet Take 1 tablet (10 mg total) by mouth every morning. 30 tablet 5  . ferrous sulfate 325 (65 FE) MG  tablet Take 325 mg by mouth 2 (two) times daily.     . fexofenadine (ALLEGRA) 180 MG tablet Take 1 tablet (180 mg total) by mouth daily. 30 tablet 5  . furosemide (LASIX) 40 MG tablet TAKE 1 TABLET BY MOUTH DAILY. 30 tablet 5  . Lidocaine-Hydrocortisone Ace 2-2 % KIT INSERT RECTALLY TWICE DAILY 24 each 5  . potassium chloride SA (K-DUR,KLOR-CON) 20 MEQ tablet TAKE 2 TABLETS BY MOUTH EVERY MORNING 60 tablet 5  . Pyridoxine HCl (VITAMIN B-6 PO) Take 1 tablet by mouth daily.     . simvastatin (ZOCOR) 40 MG tablet TAKE 1 TABLET BY MOUTH AT BEDTIME 30 tablet 5  . tamsulosin (FLOMAX) 0.4 MG CAPS capsule TAKE 1 CAPSULE BY MOUTH DAILY AFTER SUPPER 30 capsule 6   No current facility-administered medications for this visit.    Past Medical History  Diagnosis Date  . Essential hypertension, benign   . Anemia   . Gout   . ED (erectile dysfunction)   . Mixed hyperlipidemia   . Coronary atherosclerosis of native coronary artery     Multivessel status post CABG  . Impaired fasting glucose   . Insomnia   . CTS (carpal tunnel syndrome)   . Chronic systolic heart failure   . Myocardial infarction 1985  . Ischemic cardiomyopathy     LVEF 30% with severe LVH  . DDD (degenerative disc disease), lumbar   . CHF (congestive heart failure)   . Hearing difficulty   .  Dementia   . Hemorrhoids     Past Surgical History  Procedure Laterality Date  . Appendectomy    . Prostatectomy    . Cholecystectomy    . Tonsillectomy    . Colonoscopy    . Coronary artery bypass graft  2004    Dr. Roxy Manns - LIMA to LAD, SVG to circumflex, SVG to RCA  . Hand surgery Bilateral     Dupuytren's contracture repair bilaterally, right carpal tunnel syndrome surgery  . Nasal sinus surgery    . Colonoscopy  2003    Dr. Lindalou Hose: adenomatous polyp  . Colonoscopy  2010    Dr. Lindalou Hose: tubular adenoma, hyperplastic polyp. Surveillance requested for 2013  . Inguinal hernia repair Right   . Colonoscopy N/A 10/14/2013     Dr. Rourk:Multiple colonic polyps-removed as described above/Minimal internal hemorrhoids-cannot exclude small anal fissure. Tubular adenomas  . Hemorrhoid banding      Social History Mr. Heinlen reports that he quit smoking about 30 years ago. His smoking use included Cigarettes. He started smoking about 74 years ago. He has a 90 pack-year smoking history. He has never used smokeless tobacco. Mr. Krass reports that he does not drink alcohol.  Review of Systems Complete review of systems negative except as otherwise outlined in the clinical summary and also the following. Heart hearing. Arthritic pains including in the sacrum. No orthopnea or PND. No leg edema.  Physical Examination Filed Vitals:   07/11/14 1255  BP: 140/72  Pulse: 65   Filed Weights   07/11/14 1255  Weight: 159 lb (72.122 kg)    Comfortable at rest.  HEENT: Conjunctiva and lids normal, oropharynx clear.  Neck: Supple, no elevated JVP, no thyromegaly.  Lungs: Diminished but clear to auscultation, nonlabored breathing at rest.  Cardiac: Regular rate and rhythm, no S3 or significant systolic murmur, no pericardial rub.  Abdomen: Soft, nontender,bowel sounds present.  Extremities: No pitting edema, distal pulses 1-2+.  Skin: Warm and dry, scattered ecchymoses.  Musculoskeletal: No kyphosis.  Neuropsychiatric: Alert and oriented x3, calm.   Problem List and Plan   Ischemic cardiomyopathy Continue conservative medical therapy as outlined above. 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 No change made in medical regimen.  Essential hypertension, benign Continue Coreg and Vasotec at current doses. Keep follow-up with Dr. Wolfgang Phoenix.  Mixed hyperlipidemia LDL 50 on Zocor.    Satira Sark, M.D., F.A.C.C.

## 2014-07-11 NOTE — Assessment & Plan Note (Signed)
LDL 50 on Zocor.

## 2014-07-11 NOTE — Patient Instructions (Signed)
Your physician wants you to follow-up in: 6 months with Dr. McDowell You will receive a reminder letter in the mail two months in advance. If you don't receive a letter, please call our office to schedule the follow-up appointment.  Your physician recommends that you continue on your current medications as directed. Please refer to the Current Medication list given to you today.  Thank you for choosing [facility]!!    

## 2014-07-11 NOTE — Assessment & Plan Note (Signed)
Continue Coreg and Vasotec at current doses. Keep follow-up with Dr. Wolfgang Phoenix.

## 2014-07-15 ENCOUNTER — Encounter: Payer: Self-pay | Admitting: Family Medicine

## 2014-07-15 ENCOUNTER — Ambulatory Visit (INDEPENDENT_AMBULATORY_CARE_PROVIDER_SITE_OTHER): Payer: Medicare Other | Admitting: Family Medicine

## 2014-07-15 VITALS — BP 140/70 | Temp 98.4°F | Ht 69.0 in | Wt 159.2 lb

## 2014-07-15 DIAGNOSIS — I2510 Atherosclerotic heart disease of native coronary artery without angina pectoris: Secondary | ICD-10-CM

## 2014-07-15 DIAGNOSIS — M545 Low back pain, unspecified: Secondary | ICD-10-CM

## 2014-07-15 NOTE — Progress Notes (Signed)
   Subjective:    Patient ID: Evan Melendez, male    DOB: Jun 15, 1926, 80 y.o.   MRN: [id number]  Back Pain This is a new problem. The current episode started in the past 7 days. The problem occurs intermittently. The problem is unchanged. The pain is present in the lumbar spine. The quality of the pain is described as aching. The pain does not radiate. The pain is moderate. The pain is the same all the time. Stiffness is present all day. He has tried nothing for the symptoms. The treatment provided no relief.   Patient notes low back pain. Several days duration. Worse with certain motions. Minimal radiation to legs. Does not recall an acute injury.   Does not recall injuring it  No meds fofr this yet   Review of Systems  Musculoskeletal: Positive for back pain.   no chest pain no headache no abdominal pain ROS otherwise negative     Objective:   Physical Exam   Alert vital stable. HEENT normal. Lungs clear. Heart rare rhythm diffuse low back tenderness to deep palpation. Negative straight leg raise.     Assessment & Plan:  Impression flare of chronic low back pain plan patient using only one Tylenol per day may increase this to 2 3 times a day rationale discussed. No further tests at this time expect gradual improvement. WSL

## 2014-07-15 NOTE — Patient Instructions (Signed)
May take two regular strength tylenol tabs three times per day for this discomfort

## 2014-07-16 ENCOUNTER — Other Ambulatory Visit: Payer: Self-pay | Admitting: Family Medicine

## 2014-07-21 ENCOUNTER — Other Ambulatory Visit: Payer: Self-pay | Admitting: Family Medicine

## 2014-09-03 IMAGING — NM NM MYOCAR SINGLE W/SPECT W/WALL MOTION & EF
2 series · 12 of 12 positions shown · non-contrast
Comparison: none

Ordering Physician: NARODNJAK PEMPER

Angely Physician: [REDACTED]al Data: 86-year-old gentleman with ischemic cardiomyopathy.
NUCLEAR MEDICINE STRESS MYOVIEW STUDY WITH SPECT AND LEFT
VENTRICULAR EJECTION FRACTION
Radionuclide Data: One-day rest/stress protocol performed with
[DATE] mCi of Oc-TTm Myoview.
Stress Data: Treadmill exercise performed to a workload of  five
mets and a heart rate of 122, 89% of age predicted maximum.
Exercise discontinued due to fatigue; no chest discomfort reported.
Blood pressure increased from a resting value of 120/65 to 195/65,
a normal response.  No significant arrhythmias; frequent premature
supraventricular depolarization present at rest and suppressed with
exercise; occasional PVCs increased in frequency during recovery.
EKG: Sinus bradycardia with frequent and at times consecutive
premature supraventricular depolarizations; IVCD; possible prior
septal myocardial infarction; left axis deviation; ST-T wave
abnormalities suggesting anterolateral and inferior ischemia.
Stress EKG: T-wave abnormality improved with exercise; no
significant ST-segment depression.
Scintigraphic Data: Acquisition notable for mild to moderate
diaphragmatic attenuation.  There was moderate to severe left
ventricular dilatation.  On tomographic images reconstructed in
standard planes, a moderate sized defect of moderate to marked
intensity was present in the basilar and mid inferior and
inferoseptal segments extending inferoapically.  No reversibility
was apparent by comparison to the resting images.  The gated
reconstruction demonstrated moderate  to severe impairment in left
ventricular systolic function with an estimated ejection fraction
of 28%.  Moderate to severe hypokinesis was noted in the inferior
and inferoapical segments while the septum was akinetic.  Systolic
accentuation of activity was absent in the inferior, inferoseptal
and apical segments.

[Series 1: cs cardiac tc hi dose · 6.41mm/px · 6 of 512 frames shown]
[frame 43/512]
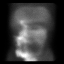
[frame 128/512]
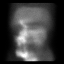
[frame 214/512]
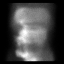
[frame 299/512]
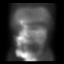
[frame 384/512]
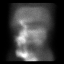
[frame 470/512]
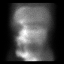

[Series 1: cr cardiac tc low dose · 6.41mm/px · 6 of 64 frames shown]
[frame 6/64]
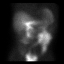
[frame 16/64]
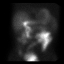
[frame 27/64]
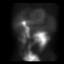
[frame 38/64]
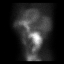
[frame 48/64]
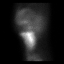
[frame 59/64]
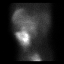

[12 of 12 positions shown; findings below may reference images not displayed]

IMPRESSION: Stress nuclear myocardial study revealing impaired exercise
capacity, substantial left ventricular dilatation and dysfunction
in a segmental pattern as described, a nondiagnostic EKG due to the
presence of and IVCD, and a sizable area of infarction involving
the inferior, inferoseptal and inferoapical segments.  Other
findings as noted.

## 2014-09-10 ENCOUNTER — Ambulatory Visit (INDEPENDENT_AMBULATORY_CARE_PROVIDER_SITE_OTHER): Payer: Medicare Other | Admitting: Family Medicine

## 2014-09-10 ENCOUNTER — Encounter: Payer: Self-pay | Admitting: Family Medicine

## 2014-09-10 VITALS — BP 130/80 | Ht 69.0 in | Wt 156.1 lb

## 2014-09-10 DIAGNOSIS — Z79899 Other long term (current) drug therapy: Secondary | ICD-10-CM

## 2014-09-10 DIAGNOSIS — E782 Mixed hyperlipidemia: Secondary | ICD-10-CM

## 2014-09-10 DIAGNOSIS — I10 Essential (primary) hypertension: Secondary | ICD-10-CM

## 2014-09-10 DIAGNOSIS — I2510 Atherosclerotic heart disease of native coronary artery without angina pectoris: Secondary | ICD-10-CM

## 2014-09-10 DIAGNOSIS — N400 Enlarged prostate without lower urinary tract symptoms: Secondary | ICD-10-CM

## 2014-09-10 DIAGNOSIS — E785 Hyperlipidemia, unspecified: Secondary | ICD-10-CM

## 2014-09-10 NOTE — Progress Notes (Signed)
   Subjective:    Patient ID: Evan Melendez, male    DOB: [birth year], 81 y.o.   MRN: [id number]  Hypertension This is a chronic problem. The current episode started more than 1 year ago. The problem has been gradually improving since onset. The problem is controlled. There are no associated agents to hypertension. There are no known risk factors for coronary artery disease. Treatments tried: enalapril. The current treatment provides significant improvement. There are no compliance problems.    Patient states that his hemorrhoids are bothering him again. This is an extremely recurrent problem for the patient.  H reports compliance with his cholesterol medication. No obvious side effects. Try to cut down fats in his diet.  History of known coronary artery disease currently symptomatically. Next  Claims compliance with his dementia medicine. This was initiated by specialist. Of note still claims absolutely is safe with his driving. Drives only during the day and went to place that he is completely aware of destination. Drives intentionally slow    Review of Systems No headache no chest pain no back pain abdominal pain no change in bowel habits no blood in stool    Objective:   Physical Exam  Alert no acute distress vital stable HET normal lungs clear heart rhythm abdomen benign rectum not checked today this patient has rectal symptoms virtually every time we see him and has been examined numerous times.      Assessment & Plan:  Impression #1 hypertension good control #2 hyperlipidemia status uncertain #3 coronary artery disease currently stable #4 mild dementia stable per patient. Plan diet exercise discussed. Medications refilled. Appropriate blood work. Recheck as scheduled. WSL

## 2014-09-11 LAB — BASIC METABOLIC PANEL
BUN: 29 mg/dL — AB (ref 6–23)
CHLORIDE: 106 meq/L (ref 96–112)
CO2: 29 meq/L (ref 19–32)
Calcium: 9.2 mg/dL (ref 8.4–10.5)
Creat: 1.52 mg/dL — ABNORMAL HIGH (ref 0.50–1.35)
GLUCOSE: 110 mg/dL — AB (ref 70–99)
POTASSIUM: 3.9 meq/L (ref 3.5–5.3)
SODIUM: 145 meq/L (ref 135–145)

## 2014-09-11 LAB — LIPID PANEL
CHOLESTEROL: 134 mg/dL (ref 0–200)
HDL: 52 mg/dL (ref >39)
LDL Cholesterol: 62 mg/dL (ref 0–99)
Total CHOL/HDL Ratio: 2.6 Ratio
Triglycerides: 102 mg/dL (ref <150)
VLDL: 20 mg/dL (ref 0–40)

## 2014-09-11 LAB — HEPATIC FUNCTION PANEL
ALBUMIN: 4.3 g/dL (ref 3.5–5.2)
ALK PHOS: 92 U/L (ref 39–117)
ALT: 11 U/L (ref 0–53)
AST: 18 U/L (ref 0–37)
BILIRUBIN TOTAL: 0.7 mg/dL (ref 0.2–1.2)
Bilirubin, Direct: 0.2 mg/dL (ref 0.0–0.3)
Indirect Bilirubin: 0.5 mg/dL (ref 0.2–1.2)
Total Protein: 6.5 g/dL (ref 6.0–8.3)

## 2014-09-18 ENCOUNTER — Encounter: Payer: Self-pay | Admitting: Family Medicine

## 2014-09-24 ENCOUNTER — Encounter: Payer: Self-pay | Admitting: Internal Medicine

## 2014-09-24 ENCOUNTER — Other Ambulatory Visit: Payer: Self-pay | Admitting: Family Medicine

## 2014-09-25 ENCOUNTER — Other Ambulatory Visit: Payer: Self-pay | Admitting: Family Medicine

## 2014-10-14 ENCOUNTER — Other Ambulatory Visit: Payer: Self-pay | Admitting: Family Medicine

## 2014-10-20 ENCOUNTER — Other Ambulatory Visit: Payer: Self-pay | Admitting: Family Medicine

## 2014-11-04 ENCOUNTER — Other Ambulatory Visit: Payer: Self-pay | Admitting: Family Medicine

## 2014-11-27 ENCOUNTER — Encounter: Payer: Self-pay | Admitting: Cardiology

## 2014-12-09 ENCOUNTER — Encounter: Payer: Self-pay | Admitting: Family Medicine

## 2014-12-09 ENCOUNTER — Ambulatory Visit (INDEPENDENT_AMBULATORY_CARE_PROVIDER_SITE_OTHER): Payer: Medicare Other | Admitting: Family Medicine

## 2014-12-09 VITALS — BP 110/68 | Ht 69.0 in | Wt 154.0 lb

## 2014-12-09 DIAGNOSIS — R413 Other amnesia: Secondary | ICD-10-CM | POA: Diagnosis not present

## 2014-12-09 DIAGNOSIS — I2510 Atherosclerotic heart disease of native coronary artery without angina pectoris: Secondary | ICD-10-CM | POA: Diagnosis not present

## 2014-12-09 DIAGNOSIS — K6289 Other specified diseases of anus and rectum: Secondary | ICD-10-CM

## 2014-12-09 DIAGNOSIS — E785 Hyperlipidemia, unspecified: Secondary | ICD-10-CM

## 2014-12-09 DIAGNOSIS — I10 Essential (primary) hypertension: Secondary | ICD-10-CM | POA: Diagnosis not present

## 2014-12-09 NOTE — Progress Notes (Signed)
   Subjective:    Patient ID: Evan Melendez, male    DOB: [birth year], 81 y.o.   MRN: [id number]  HPI  Patient here for 3 month hypertension medication follow up. Patient claims compliance with blood pressure medicine. No obvious side effects.  History of heart disease. Reports no chest pain at this time. Walking a fair amount.  Continues to be followed by specialist for early dementia. States he does not drive at night. States he drives slow and very carefully. States he has not been lost or confused while driving.   Also he is concerned about his low back pain "probably from this damp weather." Please see prior notes in this regard. Patient has had low back perirectal discomfort off and on now for the past couple years. His workup has been thorough in an enormous and mostly minimally productive. Discomfort is felt to be a combination of arthritic pain and flare of hemorrhoids    Review of Systems no chest pain no headache no back pain no abdominal pain no change in bowel habits    Objective:   Physical Exam   alert vitals stable HET normal lungs clear heart rare rhythm ankles without edema patient oriented 3 today responding well to questions      Assessment & Plan:   impression hypertension stable #2 hyperlipidemia discussed with 3 coronary artery disease asymptomatic #4 flare of chronic low back pain discussed #5 dementia followed by specialist appears still mild and controlled with medicines. Patient's family members checks in on him regular rate Plan diet exercise discussed maintain same medications. Follow-up as scheduled WSL

## 2014-12-14 ENCOUNTER — Encounter: Payer: Self-pay | Admitting: Family Medicine

## 2014-12-18 ENCOUNTER — Other Ambulatory Visit: Payer: Self-pay | Admitting: Cardiology

## 2014-12-18 ENCOUNTER — Other Ambulatory Visit: Payer: Self-pay | Admitting: Family Medicine

## 2014-12-18 ENCOUNTER — Other Ambulatory Visit: Payer: Self-pay | Admitting: Adult Health

## 2014-12-30 ENCOUNTER — Ambulatory Visit (INDEPENDENT_AMBULATORY_CARE_PROVIDER_SITE_OTHER): Payer: Medicare Other | Admitting: Cardiology

## 2014-12-30 ENCOUNTER — Encounter: Payer: Self-pay | Admitting: Cardiology

## 2014-12-30 VITALS — BP 140/58 | HR 70 | Ht 66.0 in | Wt 158.0 lb

## 2014-12-30 DIAGNOSIS — I255 Ischemic cardiomyopathy: Secondary | ICD-10-CM | POA: Diagnosis not present

## 2014-12-30 DIAGNOSIS — E782 Mixed hyperlipidemia: Secondary | ICD-10-CM

## 2014-12-30 DIAGNOSIS — I2510 Atherosclerotic heart disease of native coronary artery without angina pectoris: Secondary | ICD-10-CM

## 2014-12-30 DIAGNOSIS — I10 Essential (primary) hypertension: Secondary | ICD-10-CM

## 2014-12-30 NOTE — Progress Notes (Signed)
Cardiology Office Note  Date: 12/30/2014   ID: Evan Melendez, DOB 1925-08-16, MRN [id number]  PCP: Mickie Hillier, MD  Primary Cardiologist: Rozann Lesches, MD   Chief Complaint  Patient presents with  . Coronary Artery Disease  . Cardiomyopathy    History of Present Illness: Evan Melendez is an 81 y.o. male last seen in December 2015. He is here today with his daughter for a follow-up visit. Fortunately, he remains stable without any active angina symptoms. Continues to go to the [facility] most mornings of the week for exercise.  He just had a recent follow-up visit with Dr. Wolfgang Phoenix. Lipid panel from earlier in the year is outlined below.  Exercise Myoview from May 2014 demonstrated significant area of scar involving the inferior, inferoseptal, and inferoapical myocardium. No large ischemic zones noted and LVEF was 28%, consistent with echocardiogram. After discussions with the patient and family, we have been treating him conservatively with medical therapy, no plans for invasive testing or a device at this point.   Past Medical History  Diagnosis Date  . Essential hypertension, benign   . Anemia   . Gout   . ED (erectile dysfunction)   . Mixed hyperlipidemia   . Coronary atherosclerosis of native coronary artery     Multivessel status post CABG  . Impaired fasting glucose   . Insomnia   . CTS (carpal tunnel syndrome)   . Chronic systolic heart failure   . Myocardial infarction 1985  . Ischemic cardiomyopathy     LVEF 30% with severe LVH  . DDD (degenerative disc disease), lumbar   . Hearing difficulty   . Dementia   . Hemorrhoids     Current Outpatient Prescriptions  Medication Sig Dispense Refill  . acetaminophen (TYLENOL) 325 MG tablet Take 650 mg by mouth 2 (two) times daily.    Marland Kitchen allopurinol (ZYLOPRIM) 100 MG tablet TAKE 1 TABLET BY MOUTH AT BEDTIME 30 tablet 2  . Ascorbic Acid (VITAMIN C) 100 MG tablet Take 100 mg by mouth daily.    Marland Kitchen aspirin 81 MG tablet  Take 81 mg by mouth every morning.     . carvedilol (COREG) 6.25 MG tablet TAKE 1 TABLET BY MOUTH TWICE DAILY 180 tablet 1  . donepezil (ARICEPT) 10 MG tablet TAKE 1 TABLET BY MOUTH AT BEDTIME 90 tablet 0  . enalapril (VASOTEC) 10 MG tablet TAKE 1 TABLET BY MOUTH EVERY MORNING. 30 tablet 5  . ferrous sulfate 325 (65 FE) MG tablet Take 325 mg by mouth 2 (two) times daily.     . fexofenadine (ALLEGRA) 180 MG tablet Take 1 tablet (180 mg total) by mouth daily. (Patient taking differently: Take 180 mg by mouth as needed. ) 30 tablet 5  . furosemide (LASIX) 40 MG tablet TAKE 1 TABLET BY MOUTH DAILY. 30 tablet 5  . Lidocaine-Hydrocortisone Ace 2-2 % KIT INSERT RECTALLY TWICE DAILY (Patient taking differently: INSERT RECTALLY As needed) 24 each 5  . potassium chloride SA (K-DUR,KLOR-CON) 20 MEQ tablet TAKE 2 TABLETS BY MOUTH EVERY MORNING 60 tablet 5  . Pyridoxine HCl (VITAMIN B-6 PO) Take 1 tablet by mouth daily.     . simvastatin (ZOCOR) 40 MG tablet TAKE 1 TABLET BY MOUTH AT BEDTIME 30 tablet 5  . tamsulosin (FLOMAX) 0.4 MG CAPS capsule TAKE 1 CAPSULE BY MOUTH DAILY AFTER SUPPER 30 capsule 6   No current facility-administered medications for this visit.    Allergies:  Quinidine   Social History: The patient  reports that he quit smoking about 31 years ago. His smoking use included Cigarettes. He started smoking about 74 years ago. He has a 90 pack-year smoking history. He has never used smokeless tobacco. He reports that he does not drink alcohol or use illicit drugs.   ROS:  Please see the history of present illness. Otherwise, complete review of systems is positive for heart of hearing. Sacral pain.  All other systems are reviewed and negative.   Physical Exam: VS:  BP 140/58 mmHg  Pulse 70  Ht $R'5\' 6"'xQ$  (1.676 m)  Wt 158 lb (71.668 kg)  BMI 25.51 kg/m2  SpO2 94%, BMI Body mass index is 25.51 kg/(m^2).  Wt Readings from Last 3 Encounters:  12/30/14 158 lb (71.668 kg)  12/09/14 154 lb  (69.854 kg)  09/10/14 156 lb 2 oz (70.818 kg)     Comfortable at rest.  HEENT: Conjunctiva and lids normal, oropharynx clear.  Neck: Supple, no elevated JVP, no thyromegaly.  Lungs: Diminished but clear to auscultation, nonlabored breathing at rest.  Cardiac: Regular rate and rhythm, no S3 or significant systolic murmur, no pericardial rub.  Abdomen: Soft, nontender,bowel sounds present.  Extremities: No pitting edema, distal pulses 1-2+.  Skin: Warm and dry, scattered ecchymoses.  Musculoskeletal: No kyphosis.  Neuropsychiatric: Alert and oriented x3, calm.   ECG: ECG is not ordered today.  Recent Labwork: 09/10/2014: ALT 11; AST 18; BUN 29*; Creatinine 1.52*; Potassium 3.9; Sodium 145     Component Value Date/Time   CHOL 134 09/10/2014 0818   TRIG 102 09/10/2014 0818   HDL 52 09/10/2014 0818   CHOLHDL 2.6 09/10/2014 0818   VLDL 20 09/10/2014 0818   LDLCALC 62 09/10/2014 0818     ASSESSMENT AND PLAN:  1. Symptomatically stable CAD status post CABG with ischemic cardiomyopathy. Plan has been continued conservative management with medical therapy. No changes made today.  2. Hyperlipidemia, LDL 62 on Zocor.  3. Essential hypertension, no change to current regimen.  Current medicines were reviewed at length with the patient today.   Disposition: FU with me in 6 months.   Signed, Satira Sark, MD, [facility] 12/30/2014 11:20 AM    [facility] [place]. [address] [place], [place] [address] Phone: [phone]; Fax: [phone]

## 2014-12-30 NOTE — Patient Instructions (Signed)
Your physician wants you to follow-up in:  6 months with Dr. Domenic Polite. You will receive a reminder letter in the mail two months in advance. If you don't receive a letter, please call our office to schedule the follow-up appointment.  Your physician recommends that you continue on your current medications as directed. Please refer to the Current Medication list given to you today.  Thanks for choosing [facility]!!!

## 2015-01-07 ENCOUNTER — Other Ambulatory Visit: Payer: Self-pay | Admitting: Family Medicine

## 2015-01-19 ENCOUNTER — Other Ambulatory Visit: Payer: Self-pay | Admitting: Family Medicine

## 2015-01-20 ENCOUNTER — Ambulatory Visit: Payer: Medicare Other | Admitting: Cardiology

## 2015-01-27 ENCOUNTER — Ambulatory Visit: Payer: Medicare Other | Admitting: Adult Health

## 2015-02-16 ENCOUNTER — Other Ambulatory Visit: Payer: Self-pay | Admitting: Family Medicine

## 2015-02-27 IMAGING — CT CT HEAD W/O CM
1 series · 16 of 30 positions shown, 20 images · non-contrast
Comparison: None.

CLINICAL DATA: Dementia. Behavioral disturbance. Hypertension.
Ischemic cardiomyopathy.

EXAM:
CT HEAD WITHOUT CONTRAST
TECHNIQUE: Contiguous axial images were obtained from the base of the skull
through the vertex without intravenous contrast.

[Series 2: headseq 4.8 h37s · axial · 0.43mm/px · z∈[+159,+322]mm · 16 of 36 slices shown, 20 images]
[im 2/36  brain]
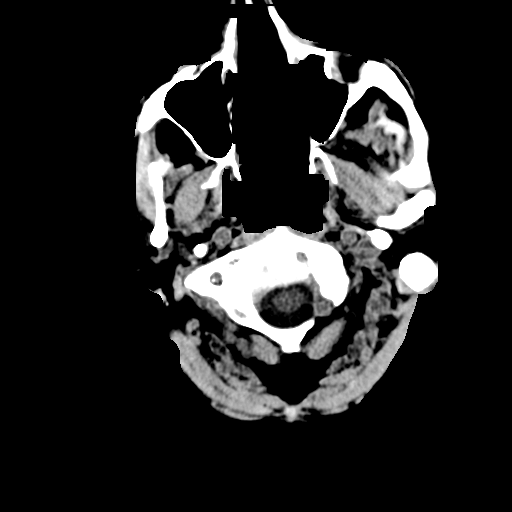
[im 2/36  bone]
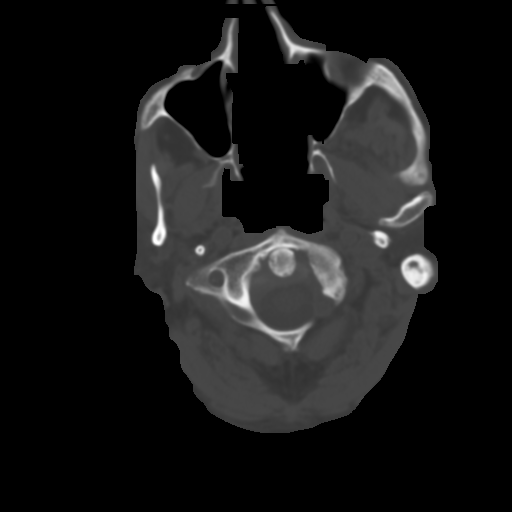
[im 4/36  brain]
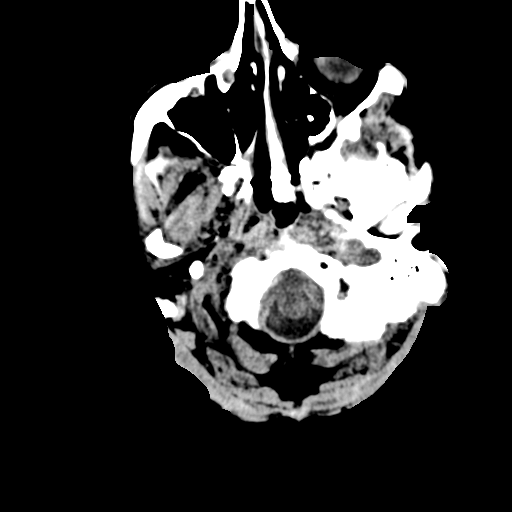
[im 7/36  brain]
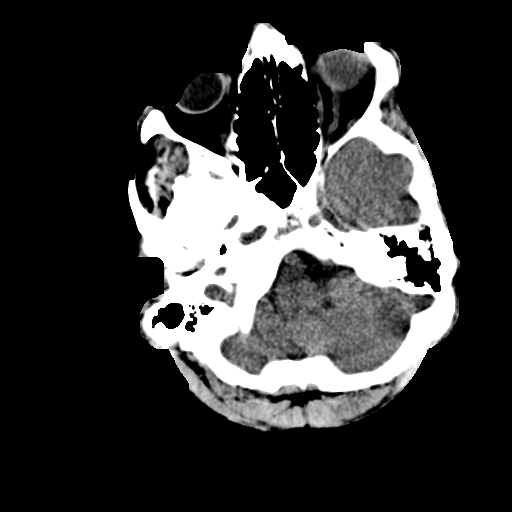
[im 9/36  brain]
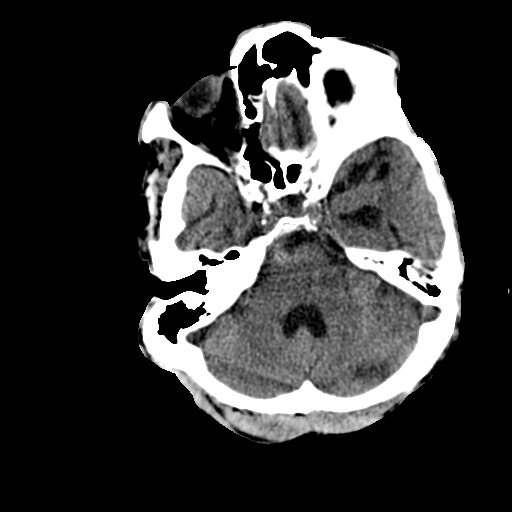
[im 10/36  brain]
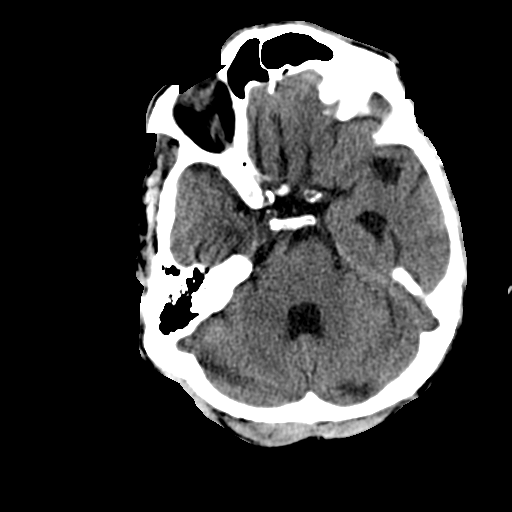
[im 10/36  bone]
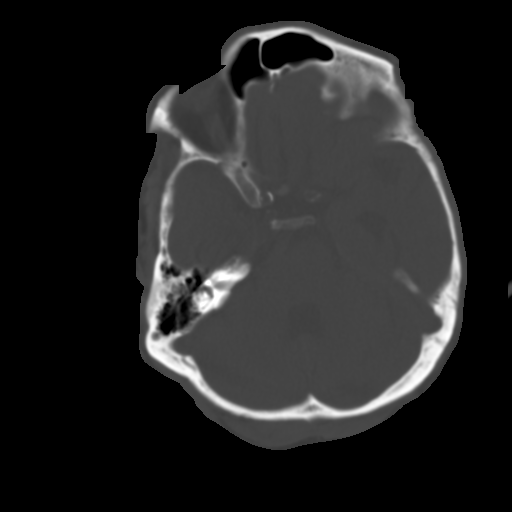
[im 13/36  brain]
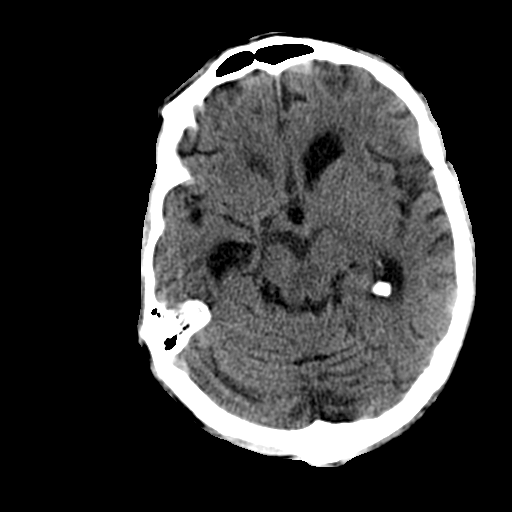
[im 15/36  brain]
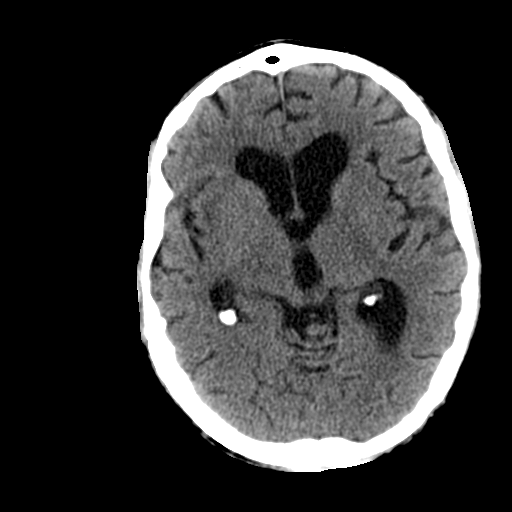
[im 17/36  brain]
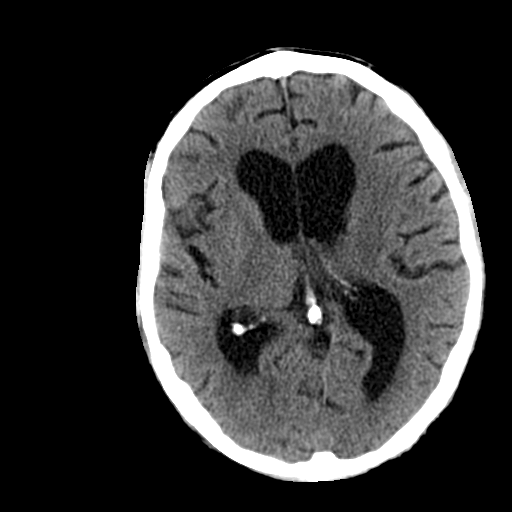
[im 19/36  brain]
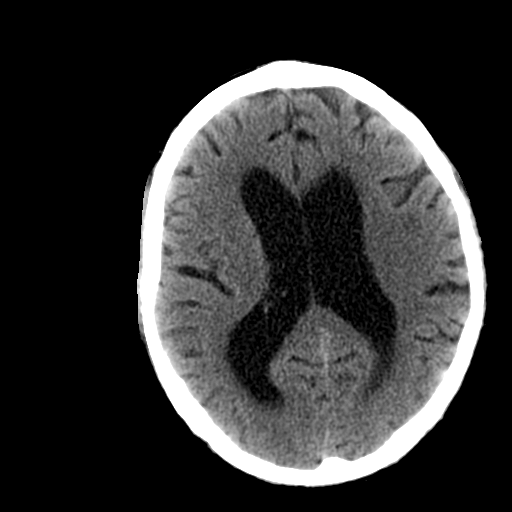
[im 19/36  bone]
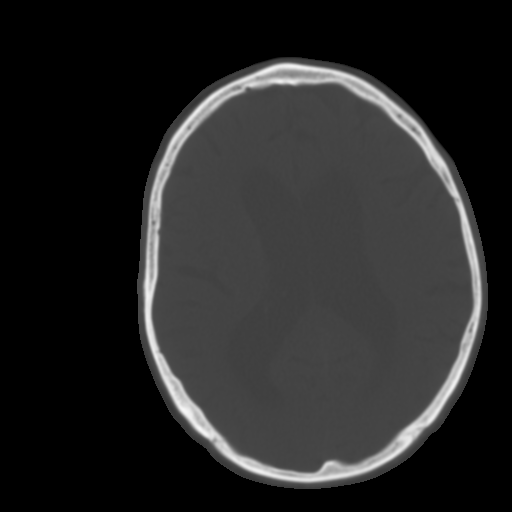
[im 21/36  brain]
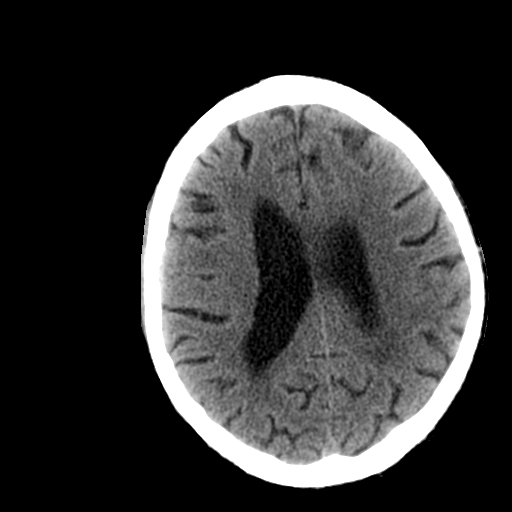
[im 23/36  brain]
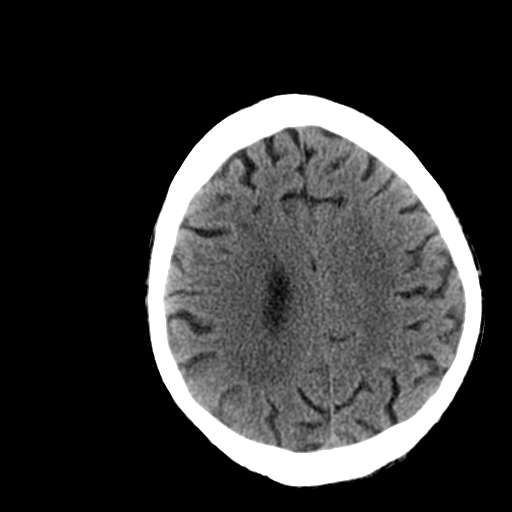
[im 26/36  brain]
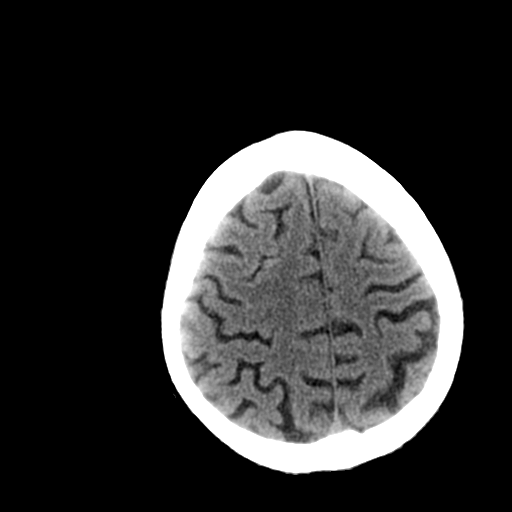
[im 27/36  brain]
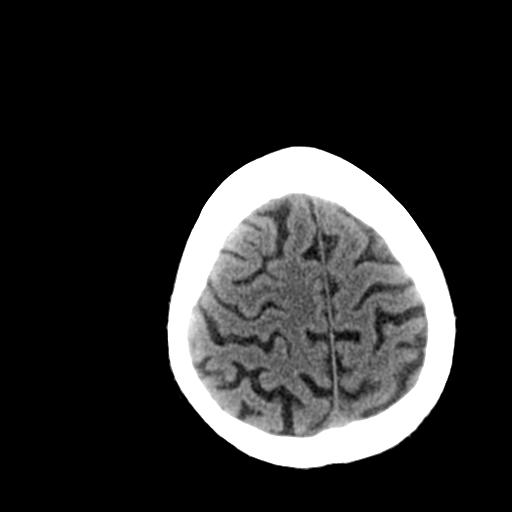
[im 27/36  bone]
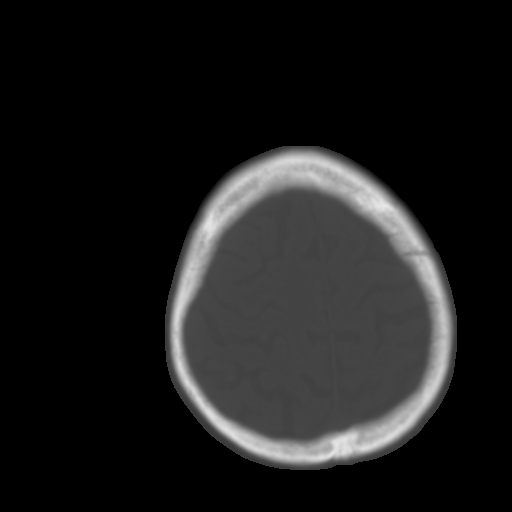
[im 29/36  brain]
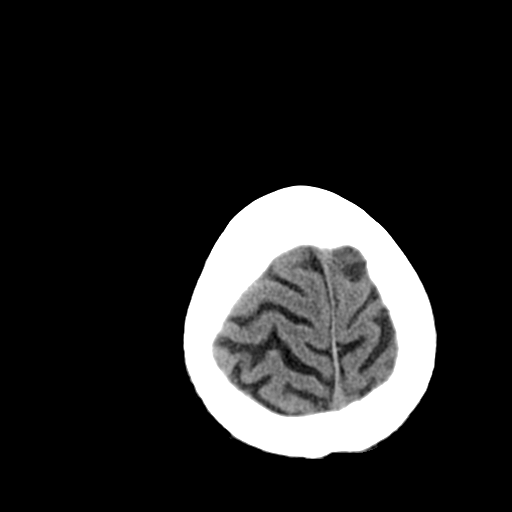
[im 32/36  brain]
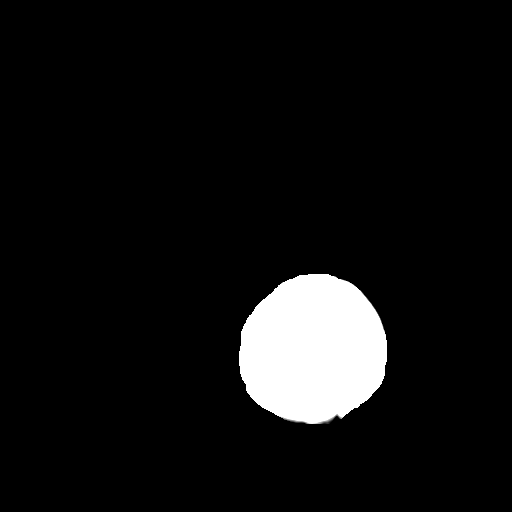
[im 34/36  brain]
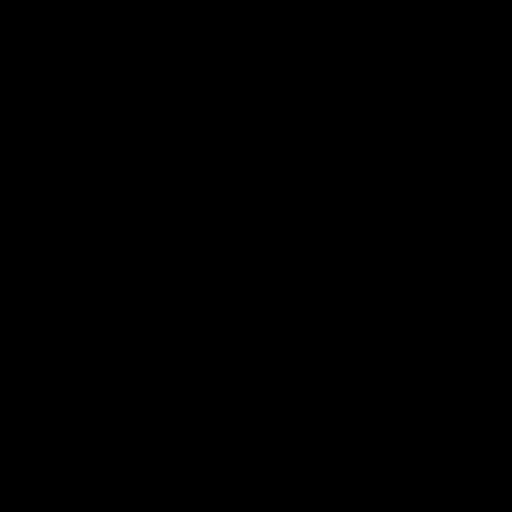

[16 of 30 positions shown; findings below may reference images not displayed]

FINDINGS: No intracranial hemorrhage.

Global atrophy. Ventricular prominence may reflect changes of
atrophy although there may be a component of mild hydrocephalus.

Very mild small vessel disease type changes without CT evidence of
large acute infarct.

No intracranial mass lesion noted on this unenhanced exam.

Right cerebellar tonsil minimally low lying but within the range of
normal limits.

Vascular calcifications.
IMPRESSION: No intracranial hemorrhage.

Global atrophy. Ventricular prominence may reflect changes of
atrophy although there may be a component of mild hydrocephalus.

Very mild small vessel disease type changes without CT evidence of
large acute infarct.

No intracranial mass lesion noted on this unenhanced exam.

## 2015-03-05 ENCOUNTER — Ambulatory Visit: Payer: Medicare Other | Admitting: Adult Health

## 2015-03-09 ENCOUNTER — Encounter: Payer: Self-pay | Admitting: Adult Health

## 2015-03-09 ENCOUNTER — Ambulatory Visit (INDEPENDENT_AMBULATORY_CARE_PROVIDER_SITE_OTHER): Payer: Medicare Other | Admitting: Adult Health

## 2015-03-09 VITALS — Ht 66.0 in | Wt 147.0 lb

## 2015-03-09 DIAGNOSIS — I255 Ischemic cardiomyopathy: Secondary | ICD-10-CM

## 2015-03-09 DIAGNOSIS — R413 Other amnesia: Secondary | ICD-10-CM

## 2015-03-09 MED ORDER — DONEPEZIL HCL 10 MG PO TABS: 10.0000 mg | ORAL_TABLET | Freq: Every day | ORAL | Status: DC

## 2015-03-09 NOTE — Progress Notes (Signed)
PATIENT: Evan Melendez DOB: Feb 28, 1926  REASON FOR VISIT: follow up -Memory HISTORY FROM: patient  HISTORY OF PRESENT ILLNESS:  Mr. Brass is an 81 year old male with a history of a mild memory disorder. He returns today for follow-up. He has continue taking Aricept and tolerates it well. The patient feels that his memory has remained the same. He is able to complete all ADLs independently. Denies having to give up anything due to his memory. He is able to complete his finances without difficulty.  He normally eats out for most of his meals.. He operates a Teacher, music without difficulty. Denies any new medical issues. He returns today for an evaluation.  HISTORY 07/02/14: Mr. River is an 81 year old male with a history of mild memory disorder. He returns today for follow-up. He is currently taking Aricept and tolerating it well. The patient feels that his memory has remained the same. He denies any left anything due to his memory. He continues to live alone and can complete all ADLs independently. He operates a Teacher, music without difficulty. No new medical issues since last seen.  HISTORY 12/30/13: 81 year old right-handed white male with a history of memory deficit. He returns today for follow-up. The patient was placed on Aricept at the last visit and states that he is tolerating it well. Patient feels that he does not have an issue with his memory. Daughter states that he will continuously repeat himself. Patient denies having to give up anything due to his memory. He lives alone and completes ADL independently. He continues to do his own finances. He operates a Teacher, music without difficulty. Denies getting any accidents or getting lost. Daughter feels that memory has remained the same since the last visit. Daughter feels that his memory is better in the spring time and worse in the winter months. No new medical issues since last visit.   REVIEW OF SYSTEMS: Out of a complete 14  system review of symptoms, the patient complains only of the following symptoms, and all other reviewed systems are negative.  ALLERGIES: Allergies  Allergen Reactions  . Quinidine Other (See Comments)    Chest Pain    HOME MEDICATIONS: Outpatient Prescriptions Prior to Visit  Medication Sig Dispense Refill  . acetaminophen (TYLENOL) 325 MG tablet Take 650 mg by mouth 2 (two) times daily.    Marland Kitchen allopurinol (ZYLOPRIM) 100 MG tablet TAKE 1 TABLET BY MOUTH AT BEDTIME 30 tablet 2  . Ascorbic Acid (VITAMIN C) 100 MG tablet Take 100 mg by mouth daily.    Marland Kitchen aspirin 81 MG tablet Take 81 mg by mouth every morning.     . carvedilol (COREG) 6.25 MG tablet TAKE 1 TABLET BY MOUTH TWICE DAILY 180 tablet 1  . donepezil (ARICEPT) 10 MG tablet TAKE 1 TABLET BY MOUTH AT BEDTIME 90 tablet 0  . enalapril (VASOTEC) 10 MG tablet TAKE 1 TABLET BY MOUTH EVERY MORNING. 30 tablet 5  . ferrous sulfate 325 (65 FE) MG tablet Take 325 mg by mouth 2 (two) times daily.     . fexofenadine (ALLEGRA) 180 MG tablet Take 1 tablet (180 mg total) by mouth daily. (Patient taking differently: Take 180 mg by mouth as needed. ) 30 tablet 5  . furosemide (LASIX) 40 MG tablet TAKE 1 TABLET BY MOUTH DAILY. 30 tablet 5  . Lidocaine-Hydrocortisone Ace 2-2 % KIT INSERT RECTALLY TWICE DAILY (Patient taking differently: INSERT RECTALLY As needed) 24 each 5  . potassium chloride SA (K-DUR,KLOR-CON) 20 MEQ tablet  TAKE 2 TABLETS BY MOUTH EVERY MORNING 60 tablet 5  . PROCTOSOL HC 2.5 % rectal cream APPLY RECTALLYBID AS NEEDED FOR HEMORRHOIDS. 29.328 g PRN  . Pyridoxine HCl (VITAMIN B-6 PO) Take 1 tablet by mouth daily.     . simvastatin (ZOCOR) 40 MG tablet TAKE 1 TABLET BY MOUTH AT BEDTIME 30 tablet 5  . tamsulosin (FLOMAX) 0.4 MG CAPS capsule TAKE 1 CAPSULE BY MOUTH DAILY AFTER SUPPER 30 capsule 4   No facility-administered medications prior to visit.    PAST MEDICAL HISTORY: Past Medical History  Diagnosis Date  . Essential  hypertension, benign   . Anemia   . Gout   . ED (erectile dysfunction)   . Mixed hyperlipidemia   . Coronary atherosclerosis of native coronary artery     Multivessel status post CABG  . Impaired fasting glucose   . Insomnia   . CTS (carpal tunnel syndrome)   . Chronic systolic heart failure   . Myocardial infarction 1985  . Ischemic cardiomyopathy     LVEF 30% with severe LVH  . DDD (degenerative disc disease), lumbar   . Hearing difficulty   . Dementia   . Hemorrhoids     PAST SURGICAL HISTORY: Past Surgical History  Procedure Laterality Date  . Appendectomy    . Prostatectomy    . Cholecystectomy    . Tonsillectomy    . Colonoscopy    . Coronary artery bypass graft  2004    Dr. Roxy Manns - LIMA to LAD, SVG to circumflex, SVG to RCA  . Hand surgery Bilateral     Dupuytren's contracture repair bilaterally, right carpal tunnel syndrome surgery  . Nasal sinus surgery    . Colonoscopy  2003    Dr. Lindalou Hose: adenomatous polyp  . Colonoscopy  2010    Dr. Lindalou Hose: tubular adenoma, hyperplastic polyp. Surveillance requested for 2013  . Inguinal hernia repair Right   . Colonoscopy N/A 10/14/2013    Dr. Rourk:Multiple colonic polyps-removed as described above/Minimal internal hemorrhoids-cannot exclude small anal fissure. Tubular adenomas  . Hemorrhoid banding      FAMILY HISTORY: Family History  Problem Relation Age of Onset  . Dementia Father   . Stroke Father   . Rheum arthritis Mother   . Heart disease Mother   . Colon cancer Neg Hx     SOCIAL HISTORY: Social History   Social History  . Marital Status: Widowed    Spouse Name: N/A  . Number of Children: 2  . Years of Education: military   Occupational History  . Retired    Social History Main Topics  . Smoking status: Former Smoker -- 3.00 packs/day for 30 years    Types: Cigarettes    Start date: 06/04/1940    Quit date: 10/15/1983  . Smokeless tobacco: Never Used  . Alcohol Use: No  . Drug Use: No  .  Sexual Activity: Yes    Birth Control/ Protection: None   Other Topics Concern  . Not on file   Social History Narrative   Patient lives at home alone.    Patient has 2 children.    Patient has a 11th grade education.    Patient is retired.    Patient is widowed.    Patient is right handed.       PHYSICAL EXAM  Filed Vitals:   03/09/15 1133  Height: _0  (1.676 m)  Weight: 147 lb (66.679 kg)   Body mass index is 23.74 kg/(m^2).  Generalized: Well developed,  in no acute distress   Neurological examination  Mentation: Alert. Follows all commands speech and language fluent Cranial nerve II-XII: Pupils were equal round reactive to light. Extraocular movements were full, visual field were full on confrontational test. Facial sensation and strength were normal. Uvula tongue midline. Head turning and shoulder shrug  were normal and symmetric. Motor: The motor testing reveals 5 over 5 strength of all 4 extremities. Good symmetric motor tone is noted throughout.  Sensory: Sensory testing is intact to soft touch on all 4 extremities. No evidence of extinction is noted.  Coordination: Cerebellar testing reveals good finger-nose-finger and heel-to-shin bilaterally.  Gait and station: Gait is normal. Tandem gait is  Slightly unsteady. Romberg is negative. No drift is seen.  Reflexes: Deep tendon reflexes are symmetric and normal bilaterally.   DIAGNOSTIC DATA (LABS, IMAGING, TESTING) - I reviewed patient records, labs, notes, testing and imaging myself where available.   ASSESSMENT AND PLAN 81 y.o. year old male  has a past medical history of Essential hypertension, benign; Anemia; Gout; ED (erectile dysfunction); Mixed hyperlipidemia; Coronary atherosclerosis of native coronary artery; Impaired fasting glucose; Insomnia; CTS (carpal tunnel syndrome); Chronic systolic heart failure; Myocardial infarction (1985); Ischemic cardiomyopathy; DDD (degenerative disc disease), lumbar; Hearing  difficulty; Dementia; and Hemorrhoids. here with:   1. Memory disorder   Overall the patient's memory has remained stable. His MMSE today is 23/30 was previously 21/30. He will continue on Aricept 10 mg at bedtime. Patient advised that if his symptoms worsen or he develops new symptoms he should let us know. He will follow-up in 6 months or sooner if needed.     Ward Givens, MSN, NP-C 03/09/2015, 11:36 AM [facility] [address], [place], [place] [address] [phone]

## 2015-03-09 NOTE — Progress Notes (Signed)
I have read the note, and I agree with the clinical assessment and plan.  Najmo Pardue KEITH   

## 2015-03-09 NOTE — Patient Instructions (Signed)
Continue Aricept If your symptoms worsen or you develop new symptoms please let us know.

## 2015-03-11 ENCOUNTER — Ambulatory Visit: Payer: Medicare Other | Admitting: Family Medicine

## 2015-03-17 ENCOUNTER — Encounter: Payer: Self-pay | Admitting: Family Medicine

## 2015-03-17 ENCOUNTER — Ambulatory Visit (INDEPENDENT_AMBULATORY_CARE_PROVIDER_SITE_OTHER): Payer: Medicare Other | Admitting: Family Medicine

## 2015-03-17 VITALS — BP 120/62 | Ht 69.0 in | Wt 152.4 lb

## 2015-03-17 DIAGNOSIS — I255 Ischemic cardiomyopathy: Secondary | ICD-10-CM | POA: Diagnosis not present

## 2015-03-17 DIAGNOSIS — E785 Hyperlipidemia, unspecified: Secondary | ICD-10-CM

## 2015-03-17 DIAGNOSIS — Z79899 Other long term (current) drug therapy: Secondary | ICD-10-CM | POA: Diagnosis not present

## 2015-03-17 NOTE — Progress Notes (Signed)
   Subjective:    Patient ID: Evan Melendez, male    DOB: [birth year], 81 y.o.   MRN: [id number]  Hypertension This is a chronic problem. The current episode started more than 1 year ago. Risk factors for coronary artery disease include dyslipidemia and male gender. Treatments tried: coreg, lasix , vasotec. There are no compliance problems.    Saw neurologist and handled visit well. Patient claims he is handling his driving well. Ongoing tried the day.  Eats lunch at the hospital, cereal for breakfast, tries to go back wehen posible   No headache no chest pain decent appetite.  Review of Systems No abdominal pain no change in bowel habits no blood in stool ROS otherwise negative    Objective:   Physical Exam  Alert vitals stable. HEENT normal lungs clear heart rare rhythm no acute distress      Assessment & Plan:  Impression 1 hypertension clinically stable #2 coronary artery disease ongoing #3 dementia see prior notes see neurology note plan medications refilled diet exercise discussed follow-up as scheduled [facility]

## 2015-03-18 ENCOUNTER — Other Ambulatory Visit: Payer: Self-pay | Admitting: Family Medicine

## 2015-03-19 LAB — BASIC METABOLIC PANEL
BUN / CREAT RATIO: 19 (ref 10–22)
BUN: 31 mg/dL — ABNORMAL HIGH (ref 8–27)
CO2: 21 mmol/L (ref 18–29)
Calcium: 8.7 mg/dL (ref 8.6–10.2)
Chloride: 107 mmol/L (ref 97–108)
Creatinine, Ser: 1.63 mg/dL — ABNORMAL HIGH (ref 0.76–1.27)
GFR calc Af Amer: 43 mL/min/{1.73_m2} — ABNORMAL LOW (ref >59)
GFR calc non Af Amer: 38 mL/min/{1.73_m2} — ABNORMAL LOW (ref >59)
GLUCOSE: 115 mg/dL — AB (ref 65–99)
POTASSIUM: 4.2 mmol/L (ref 3.5–5.2)
SODIUM: 144 mmol/L (ref 134–144)

## 2015-03-19 LAB — LIPID PANEL
CHOLESTEROL TOTAL: 112 mg/dL (ref 100–199)
Chol/HDL Ratio: 2.3 ratio units (ref 0.0–5.0)
HDL: 48 mg/dL (ref >39)
LDL Calculated: 52 mg/dL (ref 0–99)
Triglycerides: 61 mg/dL (ref 0–149)
VLDL CHOLESTEROL CAL: 12 mg/dL (ref 5–40)

## 2015-03-19 LAB — HEPATIC FUNCTION PANEL
ALK PHOS: 119 IU/L — AB (ref 39–117)
ALT: 9 IU/L (ref 0–44)
AST: 13 IU/L (ref 0–40)
Albumin: 3.7 g/dL (ref 3.5–4.7)
BILIRUBIN, DIRECT: 0.1 mg/dL (ref 0.00–0.40)
Bilirubin Total: 0.3 mg/dL (ref 0.0–1.2)
Total Protein: 5.6 g/dL — ABNORMAL LOW (ref 6.0–8.5)

## 2015-03-20 ENCOUNTER — Encounter: Payer: Self-pay | Admitting: Family Medicine

## 2015-05-06 ENCOUNTER — Other Ambulatory Visit: Payer: Self-pay | Admitting: Family Medicine

## 2015-06-11 ENCOUNTER — Ambulatory Visit: Payer: Medicare Other | Admitting: Family Medicine

## 2015-07-03 ENCOUNTER — Encounter: Payer: Self-pay | Admitting: Cardiology

## 2015-07-03 ENCOUNTER — Ambulatory Visit (INDEPENDENT_AMBULATORY_CARE_PROVIDER_SITE_OTHER): Payer: Medicare Other | Admitting: Cardiology

## 2015-07-03 VITALS — BP 120/50 | HR 82 | Ht 70.0 in | Wt 151.0 lb

## 2015-07-03 DIAGNOSIS — I10 Essential (primary) hypertension: Secondary | ICD-10-CM | POA: Diagnosis not present

## 2015-07-03 DIAGNOSIS — I255 Ischemic cardiomyopathy: Secondary | ICD-10-CM | POA: Diagnosis not present

## 2015-07-03 DIAGNOSIS — I5022 Chronic systolic (congestive) heart failure: Secondary | ICD-10-CM | POA: Diagnosis not present

## 2015-07-03 DIAGNOSIS — E782 Mixed hyperlipidemia: Secondary | ICD-10-CM

## 2015-07-03 DIAGNOSIS — I2510 Atherosclerotic heart disease of native coronary artery without angina pectoris: Secondary | ICD-10-CM

## 2015-07-03 DIAGNOSIS — I481 Persistent atrial fibrillation: Secondary | ICD-10-CM | POA: Diagnosis not present

## 2015-07-03 DIAGNOSIS — I4819 Other persistent atrial fibrillation: Secondary | ICD-10-CM

## 2015-07-03 NOTE — Patient Instructions (Signed)
Your physician wants you to follow-up in: 6 months with Dr McDowell You will receive a reminder letter in the mail two months in advance. If you don't receive a letter, please call our office to schedule the follow-up appointment.     Your physician recommends that you continue on your current medications as directed. Please refer to the Current Medication list given to you today.    If you need a refill on your cardiac medications before your next appointment, please call your pharmacy.     Thank you for choosing [facility] !        

## 2015-07-03 NOTE — Progress Notes (Signed)
Cardiology Office Note  Date: 07/03/2015   ID: Evan Melendez, DOB 01-Dec-1925, MRN [id number]  PCP: Lubertha South, MD  Primary Cardiologist: Nona Dell, MD   Chief Complaint  Patient presents with  . Coronary Artery Disease  . Cardiomyopathy   History of Present Illness: Evan Melendez is an 81 y.o. male last seen in June. He presents today with his daughter for a follow-up visit. Overall, he seems to be doing reasonably well from a cardiac perspective. He does not report any angina and describes NYHA class II symptoms with most of his activities. He still tries to go to the [facility] most mornings to walk on the treadmill using elliptical machine.  We continue to manage him conservatively for ischemic cardiomyopathy, based on previous discussions he has not wanted to pursue invasive cardiac testing or device placement.  ECG today shows newly documented atrial fibrillation with controlled ventricular response, right bundle branch block, and left anterior fascicular block. We discussed this today. We discussed role for anticoagulants in terms of stroke risk, and decision is to continue on aspirin for now.  We discussed his medications which are outlined below. Cardiac regimen now includes aspirin, Coreg, Vasotec, Lasix, and Zocor. LDL was 52 as of August.  Blood pressure control is good today.  Past Medical History  Diagnosis Date  . Essential hypertension, benign   . Anemia   . Gout   . ED (erectile dysfunction)   . Mixed hyperlipidemia   . Coronary atherosclerosis of native coronary artery     Multivessel status post CABG  . Impaired fasting glucose   . Insomnia   . CTS (carpal tunnel syndrome)   . Chronic systolic heart failure (HCC)   . Myocardial infarction (HCC) 1985  . Ischemic cardiomyopathy     LVEF 30% with severe LVH  . DDD (degenerative disc disease), lumbar   . Hearing difficulty   . Dementia   . Hemorrhoids     Past Surgical History  Procedure  Laterality Date  . Appendectomy    . Prostatectomy    . Cholecystectomy    . Tonsillectomy    . Colonoscopy    . Coronary artery bypass graft  2004    Dr. Cornelius Moras - LIMA to LAD, SVG to circumflex, SVG to RCA  . Hand surgery Bilateral     Dupuytren's contracture repair bilaterally, right carpal tunnel syndrome surgery  . Nasal sinus surgery    . Colonoscopy  2003    Dr. Cleotis Nipper: adenomatous polyp  . Colonoscopy  2010    Dr. Cleotis Nipper: tubular adenoma, hyperplastic polyp. Surveillance requested for 2013  . Inguinal hernia repair Right   . Colonoscopy N/A 10/14/2013    Dr. Rourk:Multiple colonic polyps-removed as described above/Minimal internal hemorrhoids-cannot exclude small anal fissure. Tubular adenomas  . Hemorrhoid banding      Current Outpatient Prescriptions  Medication Sig Dispense Refill  . acetaminophen (TYLENOL) 325 MG tablet Take 650 mg by mouth 2 (two) times daily.    Marland Kitchen allopurinol (ZYLOPRIM) 100 MG tablet TAKE 1 TABLET BY MOUTH AT BEDTIME 30 tablet 5  . Ascorbic Acid (VITAMIN C) 100 MG tablet Take 100 mg by mouth daily.    Marland Kitchen aspirin 81 MG tablet Take 81 mg by mouth every morning.     . carvedilol (COREG) 6.25 MG tablet TAKE 1 TABLET BY MOUTH TWICE DAILY 180 tablet 1  . donepezil (ARICEPT) 10 MG tablet Take 1 tablet (10 mg total) by mouth at bedtime. 30 tablet  6  . enalapril (VASOTEC) 10 MG tablet TAKE 1 TABLET BY MOUTH EVERY MORNING. 30 tablet 5  . ferrous sulfate 325 (65 FE) MG tablet Take 325 mg by mouth 2 (two) times daily.     . fexofenadine (ALLEGRA) 180 MG tablet Take 1 tablet (180 mg total) by mouth daily. (Patient taking differently: Take 180 mg by mouth as needed. ) 30 tablet 5  . furosemide (LASIX) 40 MG tablet TAKE 1 TABLET BY MOUTH DAILY. 30 tablet 5  . Lidocaine-Hydrocortisone Ace 2-2 % KIT INSERT RECTALLY TWICE DAILY (Patient taking differently: INSERT RECTALLY As needed) 24 each 5  . potassium chloride SA (K-DUR,KLOR-CON) 20 MEQ tablet TAKE 2 TABLETS BY  MOUTH EVERY MORNING 60 tablet 5  . PROCTOSOL HC 2.5 % rectal cream APPLY RECTALLYBID AS NEEDED FOR HEMORRHOIDS. 29.328 g PRN  . Pyridoxine HCl (VITAMIN B-6 PO) Take 1 tablet by mouth daily.     . simvastatin (ZOCOR) 40 MG tablet TAKE 1 TABLET BY MOUTH AT BEDTIME 30 tablet 5  . tamsulosin (FLOMAX) 0.4 MG CAPS capsule TAKE 1 CAPSULE BY MOUTH DAILY AFTER SUPPER 30 capsule 4   No current facility-administered medications for this visit.   Allergies:  Quinidine   Social History: The patient  reports that he quit smoking about 31 years ago. His smoking use included Cigarettes. He started smoking about 75 years ago. He has a 90 pack-year smoking history. He has never used smokeless tobacco. He reports that he does not drink alcohol or use illicit drugs.   ROS:  Please see the history of present illness. Otherwise, complete review of systems is positive for diminished hearing, chronic low back pain.  All other systems are reviewed and negative.   Physical Exam: VS:  BP 120/50 mmHg  Pulse 82  Ht $R'5\' 10"'OJ$  (1.778 m)  Wt 151 lb (68.493 kg)  BMI 21.67 kg/m2  SpO2 97%, BMI Body mass index is 21.67 kg/(m^2).  Wt Readings from Last 3 Encounters:  07/03/15 151 lb (68.493 kg)  03/17/15 152 lb 6.4 oz (69.128 kg)  03/09/15 147 lb (66.679 kg)    Elderly male, appears comfortable at rest.  HEENT: Conjunctiva and lids normal, oropharynx clear.  Neck: Supple, no elevated JVP, no thyromegaly.  Lungs: Diminished but clear to auscultation, nonlabored breathing at rest.  Cardiac: Irregularly irregular, no S3, soft systolic murmur, no pericardial rub.  Abdomen: Soft, nontender,bowel sounds present.  Extremities: No pitting edema, distal pulses 1-2+.  Skin: Warm and dry, scattered ecchymoses.  Musculoskeletal: No kyphosis.  Neuropsychiatric: Alert and oriented x3, calm.  ECG: ECG is ordered today.  Recent Labwork: 03/18/2015: ALT 9; AST 13; BUN 31*; Creatinine, Ser 1.63*; Potassium 4.2; Sodium 144       Component Value Date/Time   CHOL 112 03/18/2015 0808   CHOL 134 09/10/2014 0818   TRIG 61 03/18/2015 0808   HDL 48 03/18/2015 0808   HDL 52 09/10/2014 0818   CHOLHDL 2.3 03/18/2015 0808   CHOLHDL 2.6 09/10/2014 0818   VLDL 20 09/10/2014 0818   LDLCALC 52 03/18/2015 0808   LDLCALC 62 09/10/2014 0818    Other Studies Reviewed Today:  Exercise Myoview from May 2014 demonstrated significant area of scar involving the inferior, inferoseptal, and inferoapical myocardium. No large ischemic zones noted and LVEF was 28%, consistent with echocardiogram.  Assessment and Plan:  1. Ischemic cardiomyopathy with LVEF approximately 30%. As noted above, plan is to continue conservative management, medical therapy. Invasive testing is been declined. Fortunately, he remains  clinically stable area encouraged regular activity as tolerated. No changes in his current medications.  2. Newly documented atrial fibrillation, controlled ventricular response and a symptomatic at this time. CHADSVASC score is 5-6. We did discuss role for anticoagulants, however decision is to continue aspirin for now.  3. Hyperlipidemia, on statin therapy, LDL 52.  4. Essential hypertension, blood pressure is well controlled today. No changes in current medications.  Current medicines were reviewed with the patient today.   Orders Placed This Encounter  Procedures  . EKG 12-Lead    Disposition: FU with in 6 months.   Signed, Satira Sark, MD, [facility] 07/03/2015 2:41 PM    [place] [facility] [address], [place], [place] [address] Phone: [phone]; Fax: [phone]

## 2015-07-06 ENCOUNTER — Other Ambulatory Visit: Payer: Self-pay | Admitting: Cardiology

## 2015-07-06 ENCOUNTER — Other Ambulatory Visit: Payer: Self-pay | Admitting: Family Medicine

## 2015-07-14 ENCOUNTER — Encounter: Payer: Self-pay | Admitting: Family Medicine

## 2015-07-14 ENCOUNTER — Ambulatory Visit (INDEPENDENT_AMBULATORY_CARE_PROVIDER_SITE_OTHER): Payer: Medicare Other | Admitting: Family Medicine

## 2015-07-14 VITALS — BP 128/70 | Ht 69.0 in | Wt 152.0 lb

## 2015-07-14 DIAGNOSIS — K6289 Other specified diseases of anus and rectum: Secondary | ICD-10-CM | POA: Diagnosis not present

## 2015-07-14 DIAGNOSIS — I5022 Chronic systolic (congestive) heart failure: Secondary | ICD-10-CM

## 2015-07-14 DIAGNOSIS — I255 Ischemic cardiomyopathy: Secondary | ICD-10-CM | POA: Diagnosis not present

## 2015-07-14 DIAGNOSIS — E785 Hyperlipidemia, unspecified: Secondary | ICD-10-CM | POA: Diagnosis not present

## 2015-07-14 DIAGNOSIS — N183 Chronic kidney disease, stage 3 unspecified: Secondary | ICD-10-CM

## 2015-07-14 DIAGNOSIS — R413 Other amnesia: Secondary | ICD-10-CM

## 2015-07-14 DIAGNOSIS — I10 Essential (primary) hypertension: Secondary | ICD-10-CM | POA: Diagnosis not present

## 2015-07-14 MED ORDER — HYDROCORTISONE 2.5 % RE CREA: TOPICAL_CREAM | RECTAL | Status: AC

## 2015-07-14 NOTE — Progress Notes (Signed)
   Subjective:    Patient ID: Evan Melendez, male    DOB: 1925/12/21, 81 y.o.   MRN: [id number] patient arrives office for follow-up of chronic health concerns   Hypertension This is a chronic problem. The current episode started more than 1 year ago. There are no compliance problems (goes to the y for exercise and walks, eats healthy).    Pt having pain in tailbone. This is been a chronic problem. Thoroughly worked up. No recent constipation Pt notes he has used some cream at the drugstore for it  Patient claims compliance with his dementia medicine. Still driving. Reports no difficulty driving. Reports no driving at night. Given this medicine by the neurologist.  Compliant with lipid medication. Recent blood work reviewed. Patient maintain same level. Known coronary artery disease  Taking meds regulaly .       Review of Systems No chest pain no back pain no abdominal pain no change in bowel habits    Objective:   Physical Exam  Alert vitals stable. HEENT normal lungs clear. Heart regular in rhythm. Rectal exam performed baseline small external tags prostate normal positive tenderness presacral region      Assessment & Plan:  Impression chronic presacral pain with intermittent flares and patient desiring topical therapy #2 hypertension good control #3 chronic dementia followed by specialist #4 coronary artery disease #5 hyperlipidemia reviewed with patient to maintain same meds plan diet exercise discussed. Creams for perirectal region prescribed. Maintain same medications. Follow-up as scheduled W[facility]

## 2015-07-29 ENCOUNTER — Other Ambulatory Visit: Payer: Self-pay | Admitting: *Deleted

## 2015-07-29 NOTE — Patient Outreach (Signed)
[facility]) Care Management  07/29/2015  PERRION DIESEL 1926-01-25 [id number]   Call received from [name], daughter and [facility] for Mr. Mcgillis. She called today to reach out about Mr. Ternes' health concerns including a sharp decline in memory and cognition. Mrs. Kathyrn Drown lives out of town and comes to see Mr. Hartwig each weekend. Mrs. Kathyrn Drown tells me today that Mr. Lopezperez thought at Christmas that his family was there to celebrate his birthday. She said Mr. Life asked several times yesterday when Christmas was coming and was convinced that yesterday was early December. Mrs. Kathyrn Drown reports that Mr. Rijos was disheveled, had not bathed, had not taken his medications for several days, and had likely not been eating well. She also noted that Mr. Fullilove' car had a flat tire. Normally, Mr. Lenderman drives to [facility] for breakfast and sometimes lunch. It appeared that Mr. Frenette had not left the home since the weekend.   Mrs. Kathyrn Drown is concerned about medication management, home safety, and cognitive decline. Mr. Hinojos has HTN and history of CHF and Mrs. Kathyrn Drown is concerned that because Mr. Councilman is not taking his medications, he might not be well controlled.   Mrs. Kathyrn Drown also says that she and her brother have discussed options for in home care vs assisted living for Mr. Pecina but have not discussed this with their father.   Plan: I scheduled an appointment to see Mr. Spellman at his home tomorrow and will open him to case management. I suspect we'll need to make a referral to social work and will be discussing care options.    [place] Management  [phone]

## 2015-07-30 ENCOUNTER — Other Ambulatory Visit: Payer: Self-pay | Admitting: *Deleted

## 2015-07-30 ENCOUNTER — Encounter: Payer: Self-pay | Admitting: *Deleted

## 2015-08-04 ENCOUNTER — Other Ambulatory Visit: Payer: Self-pay | Admitting: Family Medicine

## 2015-08-04 NOTE — Patient Outreach (Signed)
[facility]) Care Management   07/30/2015  JEREMEY BASCOM 04/18/1926 [id number]  Frederich ANEUDY CHAMPLAIN is an 82 y.o. male who has history of HTN, Hyperlipdemia, CAD, CHF, CKD, and Dementia. Mr. Pyle daughter and HCPOA [name] reached out to me last week to ask if we could re-engage with Mr. Kidane for assistance with concerns about general decline and worsening short term memory and self care ability.   I am seeing Mr. Canela at home today. He is disheveled but alert and conversant.Marland Kitchen He is not oriented to time. He thinks everyone was recently at his house to celebrate his birthday and isn't sure if they've celebrated Christmas yet. He repeats himself quite frequently and asked me the same questions repetitively, not recalling that he'd just asked the question. Upon review of his pre-filled med box, it is clear that Mr. Fluckiger has missed doses of medications intermittently. Mr. Sidle tells me he has been to [facility] to eat breakfast every day as usual but his daughter reports that she just had a flat tire repaired on Mr. Whitner' car (4 days ago) and she didn't think he'd left the house in several days. Newspapers were in the driveway when I arrived, unusual for Mr. Roedl as he usually reads the paper every morning.   Subjective: "I'm doing pretty good I guess. I feel like its cold outside."  Objective:  BP 146/72 mmHg  Pulse 85  SpO2 98%   Review of Systems  Constitutional: Negative.   HENT: Negative.   Eyes: Negative.   Respiratory: Negative.   Cardiovascular: Negative.   Gastrointestinal: Negative.   Genitourinary: Negative.   Musculoskeletal: Positive for myalgias. Negative for falls.  Skin: Negative.   Neurological: Negative.   Psychiatric/Behavioral: Positive for memory loss.    Physical Exam  Constitutional: Vital signs are normal. He appears well-developed and well-nourished. He is active.  Neck: No JVD present.  Cardiovascular: Normal  rate and regular rhythm.   Respiratory: Effort normal.  GI: Soft. Bowel sounds are normal.  Neurological: He is alert.  Skin: Skin is warm and dry.  Psychiatric: He has a normal mood and affect. His speech is normal and behavior is normal. Cognition and memory are impaired. He expresses inappropriate judgment. He does not express impulsivity. He exhibits abnormal recent memory.    Current Medications:   Current Outpatient Prescriptions  Medication Sig Dispense Refill  . acetaminophen (TYLENOL) 325 MG tablet Take 650 mg by mouth 2 (two) times daily.    Marland Kitchen allopurinol (ZYLOPRIM) 100 MG tablet TAKE 1 TABLET BY MOUTH AT BEDTIME 30 tablet 5  . Ascorbic Acid (VITAMIN C) 100 MG tablet Take 100 mg by mouth daily.    Marland Kitchen aspirin 81 MG tablet Take 81 mg by mouth every morning.     . carvedilol (COREG) 6.25 MG tablet TAKE 1 TABLET BY MOUTH TWICE DAILY 180 tablet 1  . donepezil (ARICEPT) 10 MG tablet Take 1 tablet (10 mg total) by mouth at bedtime. 30 tablet 6  . enalapril (VASOTEC) 10 MG tablet TAKE 1 TABLET BY MOUTH EVERY MORNING. 30 tablet 5  . ferrous sulfate 325 (65 FE) MG tablet Take 325 mg by mouth 2 (two) times daily.     . fexofenadine (ALLEGRA) 180 MG tablet Take 1 tablet (180 mg total) by mouth daily. (Patient taking differently: Take 180 mg by mouth as needed. ) 30 tablet 5  . furosemide (LASIX) 40 MG tablet TAKE 1 TABLET BY MOUTH DAILY. 30 tablet 5  .  hydrocortisone (PROCTOSOL HC) 2.5 % rectal cream APPLY RECTALLYBID AS NEEDED FOR HEMORRHOIDS. 29.328 g PRN  . Lidocaine-Hydrocortisone Ace 2-2 % KIT INSERT RECTALLY TWICE DAILY (Patient taking differently: INSERT RECTALLY As needed) 24 each 5  . potassium chloride SA (K-DUR,KLOR-CON) 20 MEQ tablet TAKE 2 TABLETS BY MOUTH EVERY MORNING 60 tablet 0  . Pyridoxine HCl (VITAMIN B-6 PO) Take 1 tablet by mouth daily.     . simvastatin (ZOCOR) 40 MG tablet TAKE 1 TABLET BY MOUTH AT BEDTIME 30 tablet 5  . tamsulosin (FLOMAX) 0.4 MG CAPS capsule TAKE 1  CAPSULE BY MOUTH DAILY AFTER SUPPER 30 capsule 4   No current facility-administered medications for this visit.    Functional Status:   In your present state of health, do you have any difficulty performing the following activities: 07/30/2015  Hearing? N  Vision? N  Difficulty concentrating or making decisions? Y  Walking or climbing stairs? N  Dressing or bathing? Y  Doing errands, shopping? Y  Preparing Food and eating ? N  Using the Toilet? N  In the past six months, have you accidently leaked urine? N  Do you have problems with loss of bowel control? N  Managing your Medications? Y  Managing your Finances? Y  Housekeeping or managing your Housekeeping? Y    Fall/Depression Screening:    PHQ 2/9 Scores 07/30/2015 07/29/2015  PHQ - 2 Score 0 1    Assessment and Plan:  Mr. Sevillano is an 82 year old gentleman with chronic medical conditions and recent worsening self care and short term memory according to his family.  Medication Management Concerns - Mr. Kihara' daughter is managing medications and filling his pill box but Mr. Drudge is missing doses as he is having difficulty with self administration.   Level of Care Concerns - Mr. Everage is struggling with self care and home safety is becoming a concern. He is still driving but his time out of the house has declined notably. He thinks he has been leaving the house each morning for breakfast but his daughter feels certain that days are passing at a time when he does not leave the house.   I reviewed options for level of care changes with Mr. Reinig and he believes he is "doing just fine" at home alone with intermittent family visits (both children live out of town). I reviewed options for increased supervision and assistance with [name], Mr. Wirick' daughter. She and her brother are to discuss these concerns further with Mr. Hoaglin.   THN CM Care Plan Problem One        Most Recent Value   Care Plan Problem One  Medication  Management Concerns   Role Documenting the Problem One  Care Management Coordinator   Care Plan for Problem One  Active   THN Long Term Goal (31-90 days)  Patient and family will establish long term plan for safe and daily administration of prescribed medications over the next 31 days as evidenced by patient/daughter report and pill box check/pill count   THN Long Term Goal Start Date  07/30/15   Interventions for Problem One Long Term Goal  Utilizing teachback method, reviewed medications with daughter/HCPOA by phone and reviewed importance of taking all doses of medications daily with patient   THN CM Short Term Goal #1 (0-30 days)  Over the next 30 days, patient and daughter will discuss long term plan for medication administration and will verbalize determined strategy   THN CM Short Term Goal #  1 Start Date  07/30/15   Interventions for Short Term Goal #1  Utilizing teachback method, discussed possible strategies with patient/dtr    [facility] CM Care Plan Problem Two        Most Recent Value   Care Plan Problem Two  Level of Care Concerns   Role Documenting the Problem Two  Care Management Coordinator   Care Plan for Problem Two  Active   Interventions for Problem Two Long Term Goal   Utilizing teachback method, reviewed with patient and daughter options for long term care appropriate for patient who has diagnosis of dementia and is demonstrating signs of worsening short term memory and self care abilities   THN Long Term Goal (31-90) days  Over the next 31 days, patient and family will discuss options for long term care including home with paid caregivers, adult day care, residential care   [facility] Long Term Goal Start Date  07/30/15   THN CM Short Term Goal #1 (0-30 days)  Patient and daughter/son will review adult day care options and in home care options and determine start date for services over the next 30 days   THN CM Short Term Goal #1 Start Date  07/30/15   Interventions for Short Term Goal  #2   Provided materials and contact information for adult day care and in home care services      [name] [place] Management  [phone]

## 2015-08-11 ENCOUNTER — Other Ambulatory Visit: Payer: Self-pay | Admitting: *Deleted

## 2015-08-11 NOTE — Patient Outreach (Addendum)
[facility]) Care Management  08/11/2015  MIKAL BLASDELL 25-Apr-1926 [id number]  Conference call today with Mr. Mohabir son Quame Spratlin and his daughter [name]. Mr. Weatherall is aware that we were going to discuss options for ongoing care needs. Today, we reviewed in home private duty care, adult day care, and assisted living with memory care as possible long term care options.   When is poke with Mr. Jenniges a few weeks ago during a face to face visit, he said he wished to stay at home if possible. His children want to honor their father's wishes are willing to do whatever is needed to make necessary arrangements for Mr. Sallade care and safety.   I reached out to Windy Kalata, Estate manager/land agent at  [facility] today to make a new patient referral. Ms. Gildardo Pounds was not in. I left a voice message requesting return call.    [place] Management  [phone]

## 2015-08-12 ENCOUNTER — Telehealth: Payer: Self-pay | Admitting: Cardiology

## 2015-08-12 ENCOUNTER — Other Ambulatory Visit: Payer: Self-pay | Admitting: *Deleted

## 2015-08-12 NOTE — Telephone Encounter (Signed)
Please advise 

## 2015-08-12 NOTE — Patient Outreach (Signed)
[facility]) Care Management  08/12/2015  Evan Melendez May 24, 1926 [id number]   Call received from [name], daughter/HCPOA/primary caregiver for Evan Melendez. Evan Melendez is concerned about Evan Melendez and reports that he is more fatigued than usual, short of breath when lying flat, mildly swollen around his eyes and face, has weight gain of 5.7 pounds (she is unsure of the time period), has not taken any medications including daily Lasix '40mg'$  dose more than 2 times over the last 7-10 days, and feels generally poor.   Evan Melendez gave Evan Melendez his scheduled medications and an extra dose of Lasix when she arrived today. I contacted Dr. Myles Gip office requesting a return call from the nurse to discuss these symptoms and concerns.   Of note, I had a long discussion with Evan Melendez children yesterday and they are working with [facility] to make arrangements for in home care services.   Plan: I will follow up with Evan Melendez when I receive a return call from cardiology.    [place] Management  [phone]

## 2015-08-12 NOTE — Telephone Encounter (Signed)
This is not something easily handled over the phone. Based on the communication that I received from [place], the patient has not been taking his medications regularly which includes Lasix. This has likely contributed to his decompensation with weight gain. He will probably either need to be seen in the office (could add on to APP schedule) or consider hospital evaluation if his symptoms are deteriorating more quickly.

## 2015-08-12 NOTE — Patient Outreach (Signed)
[facility]) Care Management  08/12/2015  Evan Melendez 1926/06/10 [id number]  I spoke with Mrs. Evan Melendez again this afternoon and early evening to follow up on Evan Melendez reported symptoms. She states that Evan Melendez has taken all of his prescribed medications today under her supervision. He is eating and moving about the house as usual. Evan Melendez denies chest pain, lightheadedness/dizziness, nausea/abdominal discomfort. Mrs. Evan Melendez states Evan Melendez is voiding well frequently since taking his Lasix and other medications. Mrs. Evan Melendez checked Evan Melendez' heart rate this evening and it is 94. She reports that Evan Melendez can breathe and speak comfortably when sitting but begins to cough if he lies flat.   I advised Mrs. Evan Melendez to call 911 immediately if Evan Melendez experiences chest pain, worsened or acute shortness of breath, lightheadedness/dizziness/passing out, or if she felt that his symptoms were worsening in any way.   Plan: I will see Evan Melendez at home at 8:30 tomorrow morning and will provide an updated report to [place].    [place] Management  [phone]

## 2015-08-12 NOTE — Telephone Encounter (Signed)
Patient reported to Alicia/THN nurse that he has gained 5.5lbs. Elmo Putt stated that the patient seems a bit more tachycardic, tired and swollen today.  Elmo Putt is supposed to see the patient tomorrow and is wondering if there is anything she can do to help with these symptoms so that the patient won't have to come in for an office visit.

## 2015-08-13 ENCOUNTER — Other Ambulatory Visit: Payer: Self-pay | Admitting: *Deleted

## 2015-08-13 ENCOUNTER — Encounter: Payer: Self-pay | Admitting: Cardiology

## 2015-08-13 ENCOUNTER — Ambulatory Visit (INDEPENDENT_AMBULATORY_CARE_PROVIDER_SITE_OTHER): Payer: Medicare Other | Admitting: Cardiology

## 2015-08-13 VITALS — BP 122/58 | HR 89 | Ht 70.0 in | Wt 156.0 lb

## 2015-08-13 DIAGNOSIS — Z951 Presence of aortocoronary bypass graft: Secondary | ICD-10-CM | POA: Diagnosis not present

## 2015-08-13 DIAGNOSIS — I255 Ischemic cardiomyopathy: Secondary | ICD-10-CM | POA: Diagnosis not present

## 2015-08-13 DIAGNOSIS — Z9189 Other specified personal risk factors, not elsewhere classified: Secondary | ICD-10-CM

## 2015-08-13 DIAGNOSIS — I10 Essential (primary) hypertension: Secondary | ICD-10-CM

## 2015-08-13 DIAGNOSIS — I4891 Unspecified atrial fibrillation: Secondary | ICD-10-CM | POA: Insufficient documentation

## 2015-08-13 DIAGNOSIS — I4819 Other persistent atrial fibrillation: Secondary | ICD-10-CM

## 2015-08-13 DIAGNOSIS — N183 Chronic kidney disease, stage 3 unspecified: Secondary | ICD-10-CM

## 2015-08-13 DIAGNOSIS — I481 Persistent atrial fibrillation: Secondary | ICD-10-CM

## 2015-08-13 DIAGNOSIS — I5022 Chronic systolic (congestive) heart failure: Secondary | ICD-10-CM | POA: Diagnosis not present

## 2015-08-13 NOTE — Assessment & Plan Note (Signed)
He denies dyspnea

## 2015-08-13 NOTE — Assessment & Plan Note (Signed)
Controlled.  

## 2015-08-13 NOTE — Assessment & Plan Note (Signed)
EF 30% 2014

## 2015-08-13 NOTE — Progress Notes (Signed)
08/13/2015 Gard[place] Cecilie Kicks   [birth year]  [id number]  Primary Physician Mickie Hillier, MD Primary Cardiologist: Dr Domenic Polite  HPI:  Pleasant 83 y/o male, lives in his own home, alone, [place]. His daughter lives in [place]. The pt had CABG in 2004. Myoview done in 2014- medical Rx. He has ICM- EF 30% by echo 2014. When Dr Domenic Polite saw him in Dec 2016 he was noted to be in AF with CVR. Dr Domenic Polite and the pt have decided to take a conservative approach to his care, no anticoagulation. The other day his daughter found the pt's medications on the floor. An RN from [facility] went to visit the pt and noted his HR was irregular and felt he may be volume overloaded. He is seen in the office today for further evaluation. The pt denies any increase in dyspnea "I feel fine". He assures me he is taking his medications (his daughter shakes her head no).    Current Outpatient Prescriptions  Medication Sig Dispense Refill  . acetaminophen (TYLENOL) 325 MG tablet Take 650 mg by mouth 2 (two) times daily.    Marland Kitchen allopurinol (ZYLOPRIM) 100 MG tablet TAKE 1 TABLET BY MOUTH AT BEDTIME 30 tablet 5  . Ascorbic Acid (VITAMIN C) 100 MG tablet Take 100 mg by mouth daily.    Marland Kitchen aspirin 81 MG tablet Take 81 mg by mouth every morning.     . carvedilol (COREG) 6.25 MG tablet TAKE 1 TABLET BY MOUTH TWICE DAILY 180 tablet 1  . donepezil (ARICEPT) 10 MG tablet Take 1 tablet (10 mg total) by mouth at bedtime. 30 tablet 6  . enalapril (VASOTEC) 10 MG tablet TAKE 1 TABLET BY MOUTH EVERY MORNING. 30 tablet 5  . ferrous sulfate 325 (65 FE) MG tablet Take 325 mg by mouth 2 (two) times daily.     . fexofenadine (ALLEGRA) 180 MG tablet Take 1 tablet (180 mg total) by mouth daily. (Patient taking differently: Take 180 mg by mouth as needed. ) 30 tablet 5  . furosemide (LASIX) 40 MG tablet TAKE 1 TABLET BY MOUTH DAILY. 30 tablet 5  . hydrocortisone (PROCTOSOL HC) 2.5 % rectal cream APPLY RECTALLYBID AS NEEDED FOR HEMORRHOIDS. 29.328 g  PRN  . Lidocaine-Hydrocortisone Ace 2-2 % KIT INSERT RECTALLY TWICE DAILY (Patient taking differently: INSERT RECTALLY As needed) 24 each 5  . potassium chloride SA (K-DUR,KLOR-CON) 20 MEQ tablet TAKE 2 TABLETS BY MOUTH EVERY MORNING 60 tablet 5  . Pyridoxine HCl (VITAMIN B-6 PO) Take 1 tablet by mouth daily.     . simvastatin (ZOCOR) 40 MG tablet TAKE 1 TABLET BY MOUTH AT BEDTIME 30 tablet 5  . tamsulosin (FLOMAX) 0.4 MG CAPS capsule TAKE 1 CAPSULE BY MOUTH DAILY AFTER SUPPER 30 capsule 5   No current facility-administered medications for this visit.    Allergies  Allergen Reactions  . Quinidine Other (See Comments)    Chest Pain    Social History   Social History  . Marital Status: Widowed    Spouse Name: N/A  . Number of Children: 2  . Years of Education: military   Occupational History  . Retired    Social History Main Topics  . Smoking status: Former Smoker -- 3.00 packs/day for 30 years    Types: Cigarettes    Start date: 06/04/1940    Quit date: 10/15/1983  . Smokeless tobacco: Never Used  . Alcohol Use: No  . Drug Use: No  . Sexual Activity: Yes  Birth Control/ Protection: None   Other Topics Concern  . Not on file   Social History Narrative   Patient lives at home alone.    Patient has 2 children.    Patient has a 11th grade education.    Patient is retired.    Patient is widowed.    Patient is right handed.    Caffeine None .      Review of Systems: General: negative for chills, fever, night sweats or weight changes.  Cardiovascular: negative for chest pain, dyspnea on exertion, edema, orthopnea, palpitations, paroxysmal nocturnal dyspnea or shortness of breath Dermatological: negative for rash Respiratory: negative for cough or wheezing Urologic: negative for hematuria Abdominal: negative for nausea, vomiting, diarrhea, bright red blood per rectum, melena, or hematemesis Neurologic: negative for visual changes, syncope, or dizziness All other  systems reviewed and are otherwise negative except as noted above.    Blood pressure 122/58, pulse 89, height _0  (1.778 m), weight 156 lb (70.761 kg), SpO2 96 %.  General appearance: alert, cooperative, no distress and frail appearing, well dressed Neck: no JVD Lungs: scattered rhonchi Heart: irregularly irregular rhythm Extremities: no edema Skin: pale, cool, dry Neurologic: Grossly normal   ASSESSMENT AND PLAN:   Compliance with medication regimen Pt lives alone, there is concern about medication compliance OV set up after [facility] RN visit  Chronic systolic heart failure He denies dyspnea  Ischemic cardiomyopathy EF 30% 2014  Hx of CABG 2004 CABG 2004 - Myoview 2014  Essential hypertension, benign Controlled  CKD (chronic kidney disease) stage 3, GFR 30-59 ml/min Last SCr 1.6   PLAN  The pt's daughter indicates a HHRN will be coming to the pt's home to check on him. I asked the pt to please allow help in his home so he can remain there longer. He is in AF which we knew he had. His rate is not fast. I don't think he is in CHF. He has had chronic lung changes on past CXR which can explain his abnormal lung exam. No change in Rx, f/u Dr Domenic Polite in 6 months, he has a f/u with his PCP in 2 months.   Kerin Ransom K PA-C 08/13/2015 3:14 PM

## 2015-08-13 NOTE — Assessment & Plan Note (Signed)
Last SCr 1.6

## 2015-08-13 NOTE — Assessment & Plan Note (Signed)
Pt lives alone, there is concern about medication compliance OV set up after [facility] RN visit

## 2015-08-13 NOTE — Telephone Encounter (Signed)
Apt made for today 3 pm with [place] duty in home care is being scheduled by family   Pt had been alone and missed approx half his lasix pills and when daughter gave him his meds yesterday and today with an extra Lasix dose,he has diuresed well.

## 2015-08-13 NOTE — Patient Outreach (Signed)
[facility]) Care Management   08/13/2015  Aarush GAITHER BIEHN [birth year] [id number]  Gustabo VIRLAN KEMPKER is an 82 y.o. male who has history of HTN, Hyperlipdemia, CAD, CHF, CKD, and Dementia. Mr. Brockman daughter and HCPOA [name] reached out to me last week to ask if we could re-engage with Mr. Worton for assistance with concerns about general decline and worsening short term memory and self care ability.   I visited Mr. Hinchman a little over 2 weeks ago and noted a marked change in his memory and cognition.  We began working with his family on plans for in-home care and adult day care to address functions of memory deficit, such as medication adherence, meal preparation, and safety concerns.    Today I am seeing Mr. Schmid in response to his daughter's report yesterday of increased SOB, cough, swelling, and general fatigue/ weakness.  Mr. Flahive daughter noted that he had missed at least one half of his medication doses over the last 7-10 days.  Mrs. Kathyrn Drown, Mr. Nham daughter, administered his medications yesterday and this morning and she notes an improvement in SOB, cough, swelling.    I notified Dr. Domenic Polite of Mr. Cwikla new symptoms yesterday and he is now shceduled for an office visit this afternoon.  Subjective: "I guess I am feeling pretty good." (patient).  "He was having a lot of trouble breathing and really swollen when I got here." (daughter).  Objective: BP 152/80 mmHg  Pulse 100  SpO2 96%   Review of Systems  Respiratory: Positive for shortness of breath. Negative for cough, hemoptysis and sputum production.        SOB improved over last 24 hour period; no SOB at rest in sitting position with conversation  Cardiovascular: Negative for chest pain, palpitations, orthopnea, claudication and leg swelling.  Genitourinary: Negative.   Musculoskeletal: Positive for back pain. Negative for falls.       Mild chronic intermittent lower back pain  consistent with known arthritic pain  Skin:       Area of redness, size of quarter over bony prominence of (L) shoulder; dominant (L) side sleeper  Neurological: Negative.   Psychiatric/Behavioral: Positive for memory loss.       Worsening short term memory loss; unable to state month of year, time line in relation to recent holidays, but can state year and place of birth    Physical Exam  Constitutional: He appears well-developed. No distress.  Neck: No JVD present.  Cardiovascular: An irregularly irregular rhythm present. Tachycardia present.   Respiratory: Effort normal. He has rales in the right lower field and the left lower field.  Musculoskeletal: He exhibits no tenderness.  Neurological: He is alert.  Skin: Skin is warm and dry. He is not diaphoretic. There is erythema.     Psychiatric: He has a normal mood and affect. His speech is normal and behavior is normal. Thought content normal. He exhibits abnormal recent memory.  Notably worsened short term memory and orientation to time/ date over the last several months; participates in conversation but has some difficulty tracking    Current Medications:   Current Outpatient Prescriptions  Medication Sig Dispense Refill  . acetaminophen (TYLENOL) 325 MG tablet Take 650 mg by mouth 2 (two) times daily.    Marland Kitchen allopurinol (ZYLOPRIM) 100 MG tablet TAKE 1 TABLET BY MOUTH AT BEDTIME 30 tablet 5  . Ascorbic Acid (VITAMIN C) 100 MG tablet Take 100 mg by mouth daily.    Marland Kitchen aspirin 81  MG tablet Take 81 mg by mouth every morning.     . carvedilol (COREG) 6.25 MG tablet TAKE 1 TABLET BY MOUTH TWICE DAILY 180 tablet 1  . donepezil (ARICEPT) 10 MG tablet Take 1 tablet (10 mg total) by mouth at bedtime. 30 tablet 6  . enalapril (VASOTEC) 10 MG tablet TAKE 1 TABLET BY MOUTH EVERY MORNING. 30 tablet 5  . ferrous sulfate 325 (65 FE) MG tablet Take 325 mg by mouth 2 (two) times daily.     . fexofenadine (ALLEGRA) 180 MG tablet Take 1 tablet (180 mg  total) by mouth daily. (Patient taking differently: Take 180 mg by mouth as needed. ) 30 tablet 5  . furosemide (LASIX) 40 MG tablet TAKE 1 TABLET BY MOUTH DAILY. 30 tablet 5  . hydrocortisone (PROCTOSOL HC) 2.5 % rectal cream APPLY RECTALLYBID AS NEEDED FOR HEMORRHOIDS. 29.328 g PRN  . Lidocaine-Hydrocortisone Ace 2-2 % KIT INSERT RECTALLY TWICE DAILY (Patient taking differently: INSERT RECTALLY As needed) 24 each 5  . potassium chloride SA (K-DUR,KLOR-CON) 20 MEQ tablet TAKE 2 TABLETS BY MOUTH EVERY MORNING 60 tablet 5  . Pyridoxine HCl (VITAMIN B-6 PO) Take 1 tablet by mouth daily.     . simvastatin (ZOCOR) 40 MG tablet TAKE 1 TABLET BY MOUTH AT BEDTIME 30 tablet 5  . tamsulosin (FLOMAX) 0.4 MG CAPS capsule TAKE 1 CAPSULE BY MOUTH DAILY AFTER SUPPER 30 capsule 5    Assessment:  82 year old gentleman with known HTN, CAD, h/o PAC's and CHF with worsening confusion and recent weight gain, swelling, and general decline. Has not been taking medications as prescribed because of worsening memory. Daughter visiting and has been administering medications.   Acute/Chronic Health Condition (swelling/weight gain/irregular heart rate/rhythm) - Mr. Roussel is noted to have irregularly irregular heart rhythm to ausculation today; he has rales in bases; is generally weaker than his baseline. He responded well to an extra dose of lasix given yesterday by his daughter and is less swollen today. He is scheduled to see the provider in the [facility] office this afternoon.   Plan:  Mr. Hudler will be accompanied by his daughter to the provider office this afternoon.   I will follow up with the provider/patient/daughter after the appointment.   Mr. Boardley daughter will reach out to the private duty care agency today to continue arrangements for in home care.   I will see Mr. Studley next week to follow up.    [facility]  [phone]

## 2015-08-13 NOTE — Patient Instructions (Signed)
Your physician recommends that you schedule a follow-up appointment in: June with Dr. Domenic Polite  Your physician recommends that you continue on your current medications as directed. Please refer to the Current Medication list given to you today.  If you need a refill on your cardiac medications before your next appointment, please call your pharmacy.  Thank you for choosing [facility]!

## 2015-08-13 NOTE — Assessment & Plan Note (Signed)
CABG 2004 - Myoview 2014

## 2015-08-17 ENCOUNTER — Telehealth: Payer: Self-pay | Admitting: Family Medicine

## 2015-08-17 NOTE — Telephone Encounter (Signed)
Pts daughter calling to say that he needs a letter on letterhead  From you for a [place] Delivery Letter. Stating is no longer able To cross the road due to age an health an would need the mail  To be hand delivered to the home please.

## 2015-08-21 ENCOUNTER — Other Ambulatory Visit: Payer: Self-pay | Admitting: *Deleted

## 2015-08-21 NOTE — Patient Outreach (Signed)
[facility]) Care Management  08/21/2015  Evan Melendez 08-12-1925 [id number]  Call received from Evan Melendez, [name]' daughter and HCPOA (along with son Evan Melendez). Mrs. Evan Melendez reports that Evan Melendez in [facility] representative Evan Melendez visited Evan Melendez with Evan Melendez at his home yesterday and completed his intake assessment. Evan Melendez will have an in home care worker beginning Monday, January 30 from 8am-11am and again from 4:30pm-6:30pm each day, at least Monday-Friday.   Plan: I will follow up with Evan Melendez and continue collaboration with the family.    [facility]  [phone]

## 2015-08-25 ENCOUNTER — Encounter: Payer: Self-pay | Admitting: Family Medicine

## 2015-08-25 NOTE — Telephone Encounter (Signed)
Family aware the letter is ready for pick up

## 2015-09-03 ENCOUNTER — Other Ambulatory Visit: Payer: Self-pay | Admitting: *Deleted

## 2015-09-03 NOTE — Patient Outreach (Signed)
[facility]) Care Management  09/02/2015  Evan Melendez [birth year] [id number]  I received a call from Mrs. Evan Melendez today, daughter and joint POA for [name] (son Evan Melendez is other POA). Mrs. Evan Melendez wanted to share that Evan Melendez has increasingly become more confused. His in home care providers are in place twice daily for about 2 hours each visit. Mrs. Evan Melendez believes this has been an exceptional help. Evan Melendez is not driving and his car has been removed from the property. Mrs. Evan Melendez relates that Evan Melendez provided documentation allowing the postal service to move his mail box from across the street. Evan Melendez has been walking across the street to his mailbox. [place] a very busy street with through traffic and blind curves from both directions.   Evan Melendez has an upcoming appointment with neurology. Mrs. Evan Melendez and [name] are grateful for this upcoming appointment as they wish to discuss the sharp decline in Evan Melendez' cognition, self care, and general condition.   Plan: I will forward the aforementioned information to Dr. Floyde Parkins and will follow up with Evan Melendez and his children by phone and with a face to face visit as is appropriate for his needs and condition.    [facility]  [phone]

## 2015-09-08 ENCOUNTER — Telehealth: Payer: Self-pay | Admitting: Neurology

## 2015-09-08 NOTE — Telephone Encounter (Signed)
This is a note generated by the daughter that was forwarded to our office prior to the revisit on 09/14/2015.  Dr. Jannifer Franklin and Ms. Clabe Seal,   Evan Melendez is scheduled for an office visit and follow up with you on  09/14/15. Following is information sent to me by the daughter and primary caregiver. She asked me to pass this information along to you.    I am writing to you to express concerns for changes that I have noticed in my dad's health over the past couple of months. He has seemed to have a significant decline in his memory which has been very noticeable since the middle of November or thereabout.  Changes that I have noticed are: Repeating himself more frequently, increased confusion, appearing unkempt (not shaving very often and not very well when he does, not seeming to bathe often, clothes are often dirty), not sure of when he eats or what he eats, unaware of time of day at times, not taking his medicine properly (missing doses and sometimes taking double doses), gait changes.  The measures that we have taken at present are having his mailbox moved so that he does not have to cross a busy road, preparing food and leaving it for him to heat in the microwave, and having a caregiver come into his home for 3 hours in the morning and 2 hours in the early evening to ensure that he is eating and taking his meds properly.  Thank you for taking the time to review the changes that have been made in both his health and care. We are constantly striving to keep him safe and welcome all input and assistance.  Thanks so much!  Evan Melendez

## 2015-09-14 ENCOUNTER — Encounter: Payer: Self-pay | Admitting: Adult Health

## 2015-09-14 ENCOUNTER — Ambulatory Visit (INDEPENDENT_AMBULATORY_CARE_PROVIDER_SITE_OTHER): Payer: Medicare Other | Admitting: Adult Health

## 2015-09-14 VITALS — BP 104/57 | HR 64 | Ht 70.0 in | Wt 148.5 lb

## 2015-09-14 DIAGNOSIS — N289 Disorder of kidney and ureter, unspecified: Secondary | ICD-10-CM

## 2015-09-14 DIAGNOSIS — Z5181 Encounter for therapeutic drug level monitoring: Secondary | ICD-10-CM

## 2015-09-14 DIAGNOSIS — F0390 Unspecified dementia without behavioral disturbance: Secondary | ICD-10-CM | POA: Diagnosis not present

## 2015-09-14 DIAGNOSIS — I255 Ischemic cardiomyopathy: Secondary | ICD-10-CM

## 2015-09-14 NOTE — Progress Notes (Signed)
I have read the note, and I agree with the clinical assessment and plan.  Evan Melendez KEITH   

## 2015-09-14 NOTE — Patient Instructions (Addendum)
Continue Aricept  Consider Namenda pending blood work Blood work today If your symptoms worsen or you develop new symptoms please let us know.   Memantine Tablets What is this medicine? MEMANTINE (MEM an teen) is used to treat dementia caused by Alzheimer's disease. This medicine may be used for other purposes; ask your health care provider or pharmacist if you have questions. What should I tell my health care provider before I take this medicine? They need to know if you have any of these conditions: -difficulty passing urine -kidney disease -liver disease -seizures -an unusual or allergic reaction to memantine, other medicines, foods, dyes, or preservatives -pregnant or trying to get pregnant -breast-feeding How should I use this medicine? Take this medicine by mouth with a glass of water. Follow the directions on the prescription label. You may take this medicine with or without food. Take your doses at regular intervals. Do not take your medicine more often than directed. Continue to take your medicine even if you feel better. Do not stop taking except on the advice of your doctor or health care professional. Talk to your pediatrician regarding the use of this medicine in children. Special care may be needed. Overdosage: If you think you have taken too much of this medicine contact a poison control center or emergency room at once. NOTE: This medicine is only for you. Do not share this medicine with others. What if I miss a dose? If you miss a dose, take it as soon as you can. If it is almost time for your next dose, take only that dose. Do not take double or extra doses. If you do not take your medicine for several days, contact your health care provider. Your dose may need to be changed. What may interact with this  medicine? -acetazolamide -amantadine -cimetidine -dextromethorphan -dofetilide -hydrochlorothiazide -ketamine -metformin -methazolamide -quinidine -ranitidine -sodium bicarbonate -triamterene This list may not describe all possible interactions. Give your health care provider a list of all the medicines, herbs, non-prescription drugs, or dietary supplements you use. Also tell them if you smoke, drink alcohol, or use illegal drugs. Some items may interact with your medicine. What should I watch for while using this medicine? Visit your doctor or health care professional for regular checks on your progress. Check with your doctor or health care professional if there is no improvement in your symptoms or if they get worse. You may get drowsy or dizzy. Do not drive, use machinery, or do anything that needs mental alertness until you know how this drug affects you. Do not stand or sit up quickly, especially if you are an older patient. This reduces the risk of dizzy or fainting spells. Alcohol can make you more drowsy and dizzy. Avoid alcoholic drinks. What side effects may I notice from receiving this medicine? Side effects that you should report to your doctor or health care professional as soon as possible: -allergic reactions like skin rash, itching or hives, swelling of the face, lips, or tongue -agitation or a feeling of restlessness -depressed mood -dizziness -hallucinations -redness, blistering, peeling or loosening of the skin, including inside the mouth -seizures -vomiting Side effects that usually do not require medical attention (report to your doctor or health care professional if they continue or are bothersome): -constipation -diarrhea -headache -nausea -trouble sleeping This list may not describe all possible side effects. Call your doctor for medical advice about side effects. You may report side effects to FDA at 1-800-FDA-1088. Where should I keep my medicine? Keep  out of the reach of children. Store at room temperature between 15 degrees and 30 degrees C (59 degrees and 86 degrees F). Throw away any unused medicine after the expiration date. NOTE: This sheet is a summary. It may not cover all possible information. If you have questions about this medicine, talk to your doctor, pharmacist, or health care provider.    2016, Elsevier/Gold Standard. (2013-04-29 14:10:42)

## 2015-09-14 NOTE — Progress Notes (Signed)
PATIENT: Evan Melendez DOB: Dec 06, 1925  REASON FOR VISIT: follow up HISTORY FROM: patient  HISTORY OF PRESENT ILLNESS: Evan Melendez is an 82 year old male with a history of memory disturbance. He returns today for follow-up. The patient continues to take Aricept and tolerates it well. According to the patient his memory has remained stable. He lives alone and is able to complete all ADLs independently. He states that he operates a motor vehicle without difficulty. Denies any accidents or traffic violations. Denies getting lost while driving. The family tells me in private that they are unable to discuss his memory issues in front of him as he becomes agitated with them and will not speak to them after the visit. They have noticed some decline in his memory. He is very repetitive. There have been some concerns that he is not eating his meals appropriately. The family now has an Environmental consultant that comes in daily for several hours to ensure that he has his meals and monitors his medications. They have found this beneficial. Both the patient's son and daughter live out of town. They are both concerned about his driving although they have no examples of why he should not be driving other than his memory loss. The patient has only been on Aricept but  state that he has not tried [place]. The family reports there is some concern that his kidney function although has not had blood work recently. He returns today for an evaluation.  HISTORY 03/09/15: Evan Melendez is an 82 year old male with a history of a mild memory disorder. He returns today for follow-up. He has continue taking Aricept and tolerates it well. The patient feels that his memory has remained the same. He is able to complete all ADLs independently. Denies having to give up anything due to his memory. He is able to complete his finances without difficulty. He normally eats out for most of his meals.. He operates a Teacher, music without difficulty.  Denies any new medical issues. He returns today for an evaluation.  HISTORY 07/02/14: Evan Melendez is an 82 year old male with a history of mild memory disorder. He returns today for follow-up. He is currently taking Aricept and tolerating it well. The patient feels that his memory has remained the same. He denies any left anything due to his memory. He continues to live alone and can complete all ADLs independently. He operates a Teacher, music without difficulty. No new medical issues since last seen.  HISTORY 12/30/13: 82 year old right-handed white male with a history of memory deficit. He returns today for follow-up. The patient was placed on Aricept at the last visit and states that he is tolerating it well. Patient feels that he does not have an issue with his memory. Daughter states that he will continuously repeat himself. Patient denies having to give up anything due to his memory. He lives alone and completes ADL independently. He continues to do his own finances. He operates a Teacher, music without difficulty. Denies getting any accidents or getting lost. Daughter feels that memory has remained the same since the last visit. Daughter feels that his memory is better in the spring time and worse in the winter months. No new medical issues since last visit.  REVIEW OF SYSTEMS: Out of a complete 14 system review of symptoms, the patient complains only of the following symptoms, and all other reviewed systems are negative.  Appetite change, chills, daytime sleepiness, back pain, neck pain, neck stiffness, behavior problem, confusion, decreased concentration, depression, memory  loss, dizziness  ALLERGIES: Allergies  Allergen Reactions  . Quinidine Other (See Comments)    Chest Pain    HOME MEDICATIONS: Outpatient Prescriptions Prior to Visit  Medication Sig Dispense Refill  . acetaminophen (TYLENOL) 325 MG tablet Take 650 mg by mouth 2 (two) times daily.    Marland Kitchen allopurinol (ZYLOPRIM) 100 MG tablet  TAKE 1 TABLET BY MOUTH AT BEDTIME 30 tablet 5  . Ascorbic Acid (VITAMIN C) 100 MG tablet Take 100 mg by mouth daily.    Marland Kitchen aspirin 81 MG tablet Take 81 mg by mouth every morning.     . carvedilol (COREG) 6.25 MG tablet TAKE 1 TABLET BY MOUTH TWICE DAILY 180 tablet 1  . donepezil (ARICEPT) 10 MG tablet Take 1 tablet (10 mg total) by mouth at bedtime. 30 tablet 6  . enalapril (VASOTEC) 10 MG tablet TAKE 1 TABLET BY MOUTH EVERY MORNING. 30 tablet 5  . ferrous sulfate 325 (65 FE) MG tablet Take 325 mg by mouth 2 (two) times daily.     . fexofenadine (ALLEGRA) 180 MG tablet Take 1 tablet (180 mg total) by mouth daily. (Patient taking differently: Take 180 mg by mouth as needed. ) 30 tablet 5  . furosemide (LASIX) 40 MG tablet TAKE 1 TABLET BY MOUTH DAILY. 30 tablet 5  . hydrocortisone (PROCTOSOL HC) 2.5 % rectal cream APPLY RECTALLYBID AS NEEDED FOR HEMORRHOIDS. 29.328 g PRN  . Lidocaine-Hydrocortisone Ace 2-2 % KIT INSERT RECTALLY TWICE DAILY (Patient taking differently: INSERT RECTALLY As needed) 24 each 5  . potassium chloride SA (K-DUR,KLOR-CON) 20 MEQ tablet TAKE 2 TABLETS BY MOUTH EVERY MORNING 60 tablet 5  . Pyridoxine HCl (VITAMIN B-6 PO) Take 1 tablet by mouth daily.     . simvastatin (ZOCOR) 40 MG tablet TAKE 1 TABLET BY MOUTH AT BEDTIME 30 tablet 5  . tamsulosin (FLOMAX) 0.4 MG CAPS capsule TAKE 1 CAPSULE BY MOUTH DAILY AFTER SUPPER 30 capsule 5   No facility-administered medications prior to visit.    PAST MEDICAL HISTORY: Past Medical History  Diagnosis Date  . Essential hypertension, benign   . Anemia   . Gout   . ED (erectile dysfunction)   . Mixed hyperlipidemia   . Coronary atherosclerosis of native coronary artery     Multivessel status post CABG  . Impaired fasting glucose   . Insomnia   . CTS (carpal tunnel syndrome)   . Chronic systolic heart failure (HCC)   . Myocardial infarction (HCC) 1985  . Ischemic cardiomyopathy     LVEF 30% with severe LVH  . DDD  (degenerative disc disease), lumbar   . Hearing difficulty   . Dementia   . Hemorrhoids     PAST SURGICAL HISTORY: Past Surgical History  Procedure Laterality Date  . Appendectomy    . Prostatectomy    . Cholecystectomy    . Tonsillectomy    . Colonoscopy    . Coronary artery bypass graft  2004    Dr. Cornelius Moras - LIMA to LAD, SVG to circumflex, SVG to RCA  . Hand surgery Bilateral     Dupuytren's contracture repair bilaterally, right carpal tunnel syndrome surgery  . Nasal sinus surgery    . Colonoscopy  2003    Dr. Cleotis Nipper: adenomatous polyp  . Colonoscopy  2010    Dr. Cleotis Nipper: tubular adenoma, hyperplastic polyp. Surveillance requested for 2013  . Inguinal hernia repair Right   . Colonoscopy N/A 10/14/2013    Dr. Rourk:Multiple colonic polyps-removed as described above/Minimal internal  hemorrhoids-cannot exclude small anal fissure. Tubular adenomas  . Hemorrhoid banding      FAMILY HISTORY: Family History  Problem Relation Age of Onset  . Dementia Father   . Stroke Father   . Rheum arthritis Mother   . Heart disease Mother   . Colon cancer Neg Hx     SOCIAL HISTORY: Social History   Social History  . Marital Status: Widowed    Spouse Name: N/A  . Number of Children: 2  . Years of Education: military   Occupational History  . Retired    Social History Main Topics  . Smoking status: Former Smoker -- 3.00 packs/day for 30 years    Types: Cigarettes    Start date: 06/04/1940    Quit date: 10/15/1983  . Smokeless tobacco: Never Used  . Alcohol Use: No  . Drug Use: No  . Sexual Activity: Yes    Birth Control/ Protection: None   Other Topics Concern  . Not on file   Social History Narrative   Patient lives at home alone.    Patient has 2 children.    Patient has a 11th grade education.    Patient is retired.    Patient is widowed.    Patient is right handed.    Caffeine None .       PHYSICAL EXAM  Filed Vitals:   09/14/15 1155  BP: 104/57    Pulse: 64  Height: '5\' 10"'$  (1.778 m)  Weight: 148 lb 8 oz (67.359 kg)   Body mass index is 21.31 kg/(m^2).  MMSE - Mini Mental State Exam 09/14/2015 03/09/2015  Orientation to time 2 2  Orientation to Place 4 4  Registration 3 3  Attention/ Calculation 3 5  Recall 0 0  Language- name 2 objects 2 2  Language- repeat 1 1  Language- follow 3 step command 3 3  Language- read & follow direction 1 1  Write a sentence 1 1  Copy design 1 1  Total score 21 23     Generalized: Well developed, in no acute distress   Neurological examination  Mentation: Alert oriented to time, place, history taking. Follows all commands speech and language fluent Cranial nerve II-XII: Pupils were equal round reactive to light. Extraocular movements were full, visual field were full on confrontational test. Facial sensation and strength were normal. Uvula tongue midline. Head turning and shoulder shrug  were normal and symmetric. Motor: The motor testing reveals 5 over 5 strength of all 4 extremities. Good symmetric motor tone is noted throughout.  Sensory: Sensory testing is intact to soft touch on all 4 extremities. No evidence of extinction is noted.  Coordination: Cerebellar testing reveals good finger-nose-finger and heel-to-shin bilaterally.  Gait and station: Gait is normal. Tandem gait is normal. Romberg is negative. No drift is seen.  Reflexes: Deep tendon reflexes are symmetric and normal bilaterally.   DIAGNOSTIC DATA (LABS, IMAGING, TESTING) - I reviewed patient records, labs, notes, testing and imaging myself where available.      Component Value Date/Time   NA 144 03/18/2015 0808   NA 145 09/10/2014 0818   K 4.2 03/18/2015 0808   CL 107 03/18/2015 0808   CO2 21 03/18/2015 0808   GLUCOSE 115* 03/18/2015 0808   GLUCOSE 110* 09/10/2014 0818   BUN 31* 03/18/2015 0808   BUN 29* 09/10/2014 0818   CREATININE 1.63* 03/18/2015 0808   CREATININE 1.52* 09/10/2014 0818   CALCIUM 8.7 03/18/2015  0808   PROT 5.6* 03/18/2015  0808   PROT 6.5 09/10/2014 0818   ALBUMIN 3.7 03/18/2015 0808   ALBUMIN 4.3 09/10/2014 0818   AST 13 03/18/2015 0808   ALT 9 03/18/2015 0808   ALKPHOS 119* 03/18/2015 0808   BILITOT 0.3 03/18/2015 0808   BILITOT 0.7 09/10/2014 0818   GFRNONAA 38* 03/18/2015 0808   GFRAA 43* 03/18/2015 0808   Lab Results  Component Value Date   CHOL 112 03/18/2015   HDL 48 03/18/2015   LDLCALC 52 03/18/2015   TRIG 61 03/18/2015   CHOLHDL 2.3 03/18/2015   ASSESSMENT AND PLAN 82 y.o. year old male  has a past medical history of Essential hypertension, benign; Anemia; Gout; ED (erectile dysfunction); Mixed hyperlipidemia; Coronary atherosclerosis of native coronary artery; Impaired fasting glucose; Insomnia; CTS (carpal tunnel syndrome); Chronic systolic heart failure ([place]); Myocardial infarction ([place]) (1985); Ischemic cardiomyopathy; DDD (degenerative disc disease), lumbar; Hearing difficulty; Dementia; and Hemorrhoids. here with:  1. Memory disturbance  The patient's memory score has slightly declined since the last visit. MMSE today is 21/30. The patient is on Aricept. We can consider Namenda however there are some reports that he has abnormal kidney function. We will check lab work today. Pending those results we will consider starting on Namenda. As far as the driving is concerned I recommend [facility] driving rehabilitation and they can make appropriate recommendations if the patient is capable of driving. Family is amenable to this plan. He should follow-up in 6 months or sooner if needed.     Ward Givens, MSN, NP-C 09/14/2015, 1:57 PM [facility] [address], [place], [place] [address] [phone]

## 2015-09-15 ENCOUNTER — Telehealth: Payer: Self-pay | Admitting: Adult Health

## 2015-09-15 LAB — BASIC METABOLIC PANEL
BUN / CREAT RATIO: 19 (ref 10–22)
BUN: 34 mg/dL — AB (ref 8–27)
CALCIUM: 8.3 mg/dL — AB (ref 8.6–10.2)
CHLORIDE: 104 mmol/L (ref 96–106)
CO2: 24 mmol/L (ref 18–29)
CREATININE: 1.8 mg/dL — AB (ref 0.76–1.27)
GFR calc Af Amer: 38 mL/min/{1.73_m2} — ABNORMAL LOW (ref >59)
GFR calc non Af Amer: 33 mL/min/{1.73_m2} — ABNORMAL LOW (ref >59)
GLUCOSE: 86 mg/dL (ref 65–99)
Potassium: 5.1 mmol/L (ref 3.5–5.2)
Sodium: 143 mmol/L (ref 134–144)

## 2015-09-15 NOTE — Telephone Encounter (Signed)
I called the patient's daughter and left a message. She can call our office when she gets the message.

## 2015-09-15 NOTE — Telephone Encounter (Signed)
The patient's daughter called back. I explained that he does have abnormal kidney function. His creatine has increased to 1.8 for now we will hold off on namenda. We will reassess in memory in 3 months. Daughter states that as a caregiver she is becoming very exhausted. They are trying to get the patient into an adult daycare type program. I advised that they could use the [employer] program to help them identify local resources and options.

## 2015-10-02 ENCOUNTER — Other Ambulatory Visit: Payer: Self-pay | Admitting: *Deleted

## 2015-10-02 NOTE — Patient Outreach (Signed)
[facility]) Care Management  10/02/2015  Evan Melendez [birth year] [id number]  I spoke with Mrs. [name], [name], having met with Mauricio Po, Administrator for the [facility] in [place]. [name] is eligible for admission to the [facility] (Adult Day). I have the information packet/application and will request assistance from [place] for completion of his portion. Mrs. Kathyrn Drown will contact Ms. Cooper today to schedule an appointment/tour.   Plan: I will follow up with [name] and Mrs. Kathyrn Drown by phone next week.    [place] Management  [phone]

## 2015-10-05 ENCOUNTER — Encounter: Payer: Self-pay | Admitting: *Deleted

## 2015-10-06 ENCOUNTER — Telehealth: Payer: Self-pay | Admitting: Family Medicine

## 2015-10-06 NOTE — Telephone Encounter (Signed)
A form was dropped off for the pt to start activities at the [facility]. The form is in the yellow folder at the nurse station.

## 2015-10-09 ENCOUNTER — Encounter: Payer: Self-pay | Admitting: *Deleted

## 2015-10-09 ENCOUNTER — Other Ambulatory Visit: Payer: Self-pay | Admitting: *Deleted

## 2015-10-09 ENCOUNTER — Ambulatory Visit: Payer: Medicare Other | Admitting: Family Medicine

## 2015-10-09 NOTE — Patient Outreach (Signed)
[facility]) Care Management   10/09/2015  Evan Melendez [birth year] [id number]  Evan Melendez is an 82 y.o. male who has history of HTN, Hyperlipdemia, CAD, CHF, CKD, and Dementia. Most recently [name] has experienced decline in self care ability, decision making/insight/judgment, and memory.   Subjective: "I liked that [facility] place. I think I can play my mandolin over there."  Objective:  BP 120/64 mmHg  Pulse 58  SpO2 98%  Review of Systems  Constitutional: Negative.   HENT: Negative.   Eyes: Negative.   Respiratory: Negative for cough, shortness of breath and wheezing.   Cardiovascular: Negative for chest pain, palpitations and leg swelling.  Gastrointestinal: Negative for nausea, vomiting and abdominal pain.  Genitourinary: Negative for dysuria and urgency.  Musculoskeletal: Negative for falls.  Skin: Negative.   Neurological: Negative.   Psychiatric/Behavioral: Positive for memory loss. Negative for hallucinations. The patient is not nervous/anxious.     Physical Exam  Constitutional: He is oriented to person, place, and time. He appears well-developed and well-nourished. He is active.  Neck: No JVD present.  Cardiovascular: Normal rate and regular rhythm.   Respiratory: Effort normal and breath sounds normal.  GI: Soft. Bowel sounds are normal.  Neurological: He is alert and oriented to person, place, and time.  Skin: Skin is warm, dry and intact.  Psychiatric: He has a normal mood and affect. His speech is normal and behavior is normal. He exhibits abnormal recent memory.  Diminished insight    Current Medications:   Current Outpatient Prescriptions  Medication Sig Dispense Refill  . acetaminophen (TYLENOL) 325 MG tablet Take 650 mg by mouth 2 (two) times daily.    Marland Kitchen allopurinol (ZYLOPRIM) 100 MG tablet TAKE 1 TABLET BY MOUTH AT BEDTIME 30 tablet 5  . Ascorbic Acid (VITAMIN C) 100 MG tablet Take 100 mg by mouth daily.    Marland Kitchen aspirin 81 MG  tablet Take 81 mg by mouth every morning.     . carvedilol (COREG) 6.25 MG tablet TAKE 1 TABLET BY MOUTH TWICE DAILY 180 tablet 1  . donepezil (ARICEPT) 10 MG tablet Take 1 tablet (10 mg total) by mouth at bedtime. 30 tablet 6  . enalapril (VASOTEC) 10 MG tablet TAKE 1 TABLET BY MOUTH EVERY MORNING. 30 tablet 5  . ferrous sulfate 325 (65 FE) MG tablet Take 325 mg by mouth 2 (two) times daily.     . fexofenadine (ALLEGRA) 180 MG tablet Take 1 tablet (180 mg total) by mouth daily. (Patient taking differently: Take 180 mg by mouth as needed. ) 30 tablet 5  . furosemide (LASIX) 40 MG tablet TAKE 1 TABLET BY MOUTH DAILY. 30 tablet 5  . hydrocortisone (PROCTOSOL HC) 2.5 % rectal cream APPLY RECTALLYBID AS NEEDED FOR HEMORRHOIDS. 29.328 g PRN  . Lidocaine-Hydrocortisone Ace 2-2 % KIT INSERT RECTALLY TWICE DAILY 24 each 5  . potassium chloride SA (K-DUR,KLOR-CON) 20 MEQ tablet TAKE 2 TABLETS BY MOUTH EVERY MORNING 60 tablet 5  . Pyridoxine HCl (VITAMIN B-6 PO) Take 1 tablet by mouth daily.     . simvastatin (ZOCOR) 40 MG tablet TAKE 1 TABLET BY MOUTH AT BEDTIME 30 tablet 5  . tamsulosin (FLOMAX) 0.4 MG CAPS capsule TAKE 1 CAPSULE BY MOUTH DAILY AFTER SUPPER 30 capsule 5    Assessment:  82 year old gentleman with known HTN, CAD, h/o PAC's and CHF with worsening confusion and declining short term memory over the last several months.   Level of Care Concerns -  [name] began havening declining memory and worsening confusion over the last several months; we were able to get an in home caregiver in place a few hours each morning and each afternoon through [facility]; in addition, [name] and his daughter and daughter in law visited the [facility] last week and 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' admission; he is having his TB test read today and plans to start the program on Monday; his daughter [name] will get in touch with me next week to let me know how things are going.   Chronic Cardiovascular Health Conditions - [name] is seeing his providers regularly, is taking medications more closely to prescribed regimen as his daughter fills his pill boxes and his in home caregiver from [facility] is there each morning and afternoon to remind him to take his meds. [name] physical assessment is benign today and he has no complaints of any kind related to his cardiovascular health.    Plan:   [name] will start attending the [facility] on Monday, 10/12/15.   [name] will continue to receive morning and afternoon visits from in home care provider through [facility] on Tuesday/Thursday. On M/W/F his care provider will come in the morning, help him with breakfast and morning meds, getting dressed, and will drive him to the [facility] center. In the afternoon, his in home care provider will pick him up from the [facility] and take [name] home, help him with evening medications and evening meal, making sure he is safely settled in at home before leaving.   Mrs. Kathyrn Drown, Mr. Bannan' daughter, will update me on [name] experience at the [facility] center.   I will follow up with [name] and his daughter by phone over the next few weeks to stay abreast of his progress.   [name]        Most Recent Value   Care Plan Problem Two  Level of Care Concerns   Role Documenting the Problem Two  Care Management Coordinator   Care Plan for Problem Two  Active   Interventions for Problem Two Long Term Goal   Utilizing teachback method, reviewed with daughter details of [place] application process    [facility] Term Goal (31-90) days  Over the next 31 days, patient and family will discuss adult day options for long term care   [facility] Long Term Goal Start Date  10/02/15   [facility] CM Short Term Goal #1 (0-30 days)  Over the next 30 days, patient/family will explore Adult Day Care [facility]) as option for additional adult care   [facility] CM Short Term Goal #1 Start Date  10/02/15 Earvin Hansen,  [facility] center now open/accepting new applicati]   [facility] CM Short Term Goal #1 Met Date   10/09/15   Interventions for Short Term Goal #2   Provided updated information re: [facility] CM Short Term Goal #2 (0-30 days)  Over the next 30 days, patient will begin attending adult day program in [facility] and daughter will report on patient progress  [facility] CM Short Term Goal #2 Start Date  10/09/15   Interventions for Short Term Goal #2  Finalized required documentation for program admission,  discussed with patient, daughter, and Optician, dispensing plans for patien to attend 1st of program next week,  advised dtr to call me with report of patient's experience and progress      [facility]  [phone]

## 2015-10-17 ENCOUNTER — Encounter (HOSPITAL_COMMUNITY): Payer: Self-pay | Admitting: Emergency Medicine

## 2015-10-17 ENCOUNTER — Emergency Department (HOSPITAL_COMMUNITY): Payer: Medicare Other

## 2015-10-17 ENCOUNTER — Inpatient Hospital Stay (HOSPITAL_COMMUNITY)
Admission: EM | Admit: 2015-10-17 | Discharge: 2015-10-22 | DRG: 378 | Disposition: A | Discharge status: Skilled Nursing Facility | Payer: Medicare Other | Attending: Internal Medicine | Admitting: Internal Medicine

## 2015-10-17 DIAGNOSIS — Z86010 Personal history of colonic polyps: Secondary | ICD-10-CM

## 2015-10-17 DIAGNOSIS — D491 Neoplasm of unspecified behavior of respiratory system: Secondary | ICD-10-CM

## 2015-10-17 DIAGNOSIS — R059 Cough, unspecified: Secondary | ICD-10-CM | POA: Diagnosis present

## 2015-10-17 DIAGNOSIS — F0390 Unspecified dementia without behavioral disturbance: Secondary | ICD-10-CM | POA: Diagnosis present

## 2015-10-17 DIAGNOSIS — I351 Nonrheumatic aortic (valve) insufficiency: Secondary | ICD-10-CM | POA: Diagnosis not present

## 2015-10-17 DIAGNOSIS — Z515 Encounter for palliative care: Secondary | ICD-10-CM | POA: Insufficient documentation

## 2015-10-17 DIAGNOSIS — Z87891 Personal history of nicotine dependence: Secondary | ICD-10-CM

## 2015-10-17 DIAGNOSIS — Z7982 Long term (current) use of aspirin: Secondary | ICD-10-CM | POA: Diagnosis not present

## 2015-10-17 DIAGNOSIS — K922 Gastrointestinal hemorrhage, unspecified: Secondary | ICD-10-CM

## 2015-10-17 DIAGNOSIS — R918 Other nonspecific abnormal finding of lung field: Secondary | ICD-10-CM | POA: Diagnosis present

## 2015-10-17 DIAGNOSIS — R531 Weakness: Secondary | ICD-10-CM

## 2015-10-17 DIAGNOSIS — Z8249 Family history of ischemic heart disease and other diseases of the circulatory system: Secondary | ICD-10-CM | POA: Diagnosis not present

## 2015-10-17 DIAGNOSIS — C3490 Malignant neoplasm of unspecified part of unspecified bronchus or lung: Secondary | ICD-10-CM | POA: Diagnosis present

## 2015-10-17 DIAGNOSIS — J101 Influenza due to other identified influenza virus with other respiratory manifestations: Secondary | ICD-10-CM | POA: Diagnosis present

## 2015-10-17 DIAGNOSIS — I130 Hypertensive heart and chronic kidney disease with heart failure and stage 1 through stage 4 chronic kidney disease, or unspecified chronic kidney disease: Secondary | ICD-10-CM | POA: Diagnosis present

## 2015-10-17 DIAGNOSIS — I482 Chronic atrial fibrillation: Secondary | ICD-10-CM | POA: Diagnosis present

## 2015-10-17 DIAGNOSIS — D696 Thrombocytopenia, unspecified: Secondary | ICD-10-CM | POA: Diagnosis present

## 2015-10-17 DIAGNOSIS — D649 Anemia, unspecified: Secondary | ICD-10-CM | POA: Diagnosis not present

## 2015-10-17 DIAGNOSIS — Z66 Do not resuscitate: Secondary | ICD-10-CM | POA: Diagnosis present

## 2015-10-17 DIAGNOSIS — I248 Other forms of acute ischemic heart disease: Secondary | ICD-10-CM | POA: Diagnosis present

## 2015-10-17 DIAGNOSIS — Z8261 Family history of arthritis: Secondary | ICD-10-CM | POA: Diagnosis not present

## 2015-10-17 DIAGNOSIS — I255 Ischemic cardiomyopathy: Secondary | ICD-10-CM | POA: Diagnosis present

## 2015-10-17 DIAGNOSIS — M1990 Unspecified osteoarthritis, unspecified site: Secondary | ICD-10-CM | POA: Diagnosis present

## 2015-10-17 DIAGNOSIS — N183 Chronic kidney disease, stage 3 unspecified: Secondary | ICD-10-CM | POA: Diagnosis present

## 2015-10-17 DIAGNOSIS — I959 Hypotension, unspecified: Secondary | ICD-10-CM | POA: Diagnosis present

## 2015-10-17 DIAGNOSIS — D62 Acute posthemorrhagic anemia: Secondary | ICD-10-CM | POA: Diagnosis present

## 2015-10-17 DIAGNOSIS — E782 Mixed hyperlipidemia: Secondary | ICD-10-CM | POA: Diagnosis present

## 2015-10-17 DIAGNOSIS — Z7189 Other specified counseling: Secondary | ICD-10-CM | POA: Insufficient documentation

## 2015-10-17 DIAGNOSIS — K921 Melena: Principal | ICD-10-CM | POA: Diagnosis present

## 2015-10-17 DIAGNOSIS — I4891 Unspecified atrial fibrillation: Secondary | ICD-10-CM | POA: Diagnosis present

## 2015-10-17 DIAGNOSIS — I5022 Chronic systolic (congestive) heart failure: Secondary | ICD-10-CM | POA: Diagnosis not present

## 2015-10-17 DIAGNOSIS — R7989 Other specified abnormal findings of blood chemistry: Secondary | ICD-10-CM

## 2015-10-17 DIAGNOSIS — R778 Other specified abnormalities of plasma proteins: Secondary | ICD-10-CM | POA: Diagnosis present

## 2015-10-17 DIAGNOSIS — Z951 Presence of aortocoronary bypass graft: Secondary | ICD-10-CM

## 2015-10-17 DIAGNOSIS — Z823 Family history of stroke: Secondary | ICD-10-CM | POA: Diagnosis not present

## 2015-10-17 DIAGNOSIS — R05 Cough: Secondary | ICD-10-CM | POA: Diagnosis present

## 2015-10-17 DIAGNOSIS — I10 Essential (primary) hypertension: Secondary | ICD-10-CM | POA: Diagnosis present

## 2015-10-17 LAB — CBC WITH DIFFERENTIAL/PLATELET
Basophils Absolute: 0 10*3/uL (ref 0.0–0.1)
Basophils Relative: 0 %
Eosinophils Absolute: 0 10*3/uL (ref 0.0–0.7)
Eosinophils Relative: 0 %
HCT: 22.6 % — ABNORMAL LOW (ref 39.0–52.0)
HEMOGLOBIN: 6.5 g/dL — AB (ref 13.0–17.0)
LYMPHS ABS: 0.6 10*3/uL — AB (ref 0.7–4.0)
LYMPHS PCT: 12 %
MCH: 22.9 pg — AB (ref 26.0–34.0)
MCHC: 28.8 g/dL — AB (ref 30.0–36.0)
MCV: 79.6 fL (ref 78.0–100.0)
MONOS PCT: 8 %
Monocytes Absolute: 0.4 10*3/uL (ref 0.1–1.0)
NEUTROS PCT: 80 %
Neutro Abs: 3.7 10*3/uL (ref 1.7–7.7)
Platelets: 134 10*3/uL — ABNORMAL LOW (ref 150–400)
RBC: 2.84 MIL/uL — AB (ref 4.22–5.81)
RDW: 18.4 % — ABNORMAL HIGH (ref 11.5–15.5)
WBC: 4.7 10*3/uL (ref 4.0–10.5)

## 2015-10-17 LAB — BASIC METABOLIC PANEL
Anion gap: 8 (ref 5–15)
BUN: 44 mg/dL — AB (ref 6–20)
CHLORIDE: 108 mmol/L (ref 101–111)
CO2: 22 mmol/L (ref 22–32)
Calcium: 7.9 mg/dL — ABNORMAL LOW (ref 8.9–10.3)
Creatinine, Ser: 1.71 mg/dL — ABNORMAL HIGH (ref 0.61–1.24)
GFR calc Af Amer: 40 mL/min — ABNORMAL LOW (ref >60)
GFR calc non Af Amer: 34 mL/min — ABNORMAL LOW (ref >60)
GLUCOSE: 115 mg/dL — AB (ref 65–99)
POTASSIUM: 4.9 mmol/L (ref 3.5–5.1)
Sodium: 138 mmol/L (ref 135–145)

## 2015-10-17 LAB — TROPONIN I
TROPONIN I: 0.13 ng/mL — AB (ref <0.031)
Troponin I: 0.13 ng/mL — ABNORMAL HIGH (ref <0.031)

## 2015-10-17 LAB — RETICULOCYTES
RBC.: 2.83 MIL/uL — ABNORMAL LOW (ref 4.22–5.81)
RETIC CT PCT: 2.2 % (ref 0.4–3.1)
Retic Count, Absolute: 62.3 10*3/uL (ref 19.0–186.0)

## 2015-10-17 LAB — I-STAT CG4 LACTIC ACID, ED: Lactic Acid, Venous: 1.72 mmol/L (ref 0.5–2.0)

## 2015-10-17 LAB — APTT: aPTT: 37 seconds (ref 24–37)

## 2015-10-17 LAB — POC OCCULT BLOOD, ED: Fecal Occult Bld: POSITIVE — AB

## 2015-10-17 LAB — PREPARE RBC (CROSSMATCH)

## 2015-10-17 LAB — PROTIME-INR
INR: 1.5 — ABNORMAL HIGH (ref 0.00–1.49)
PROTHROMBIN TIME: 18.1 s — AB (ref 11.6–15.2)

## 2015-10-17 LAB — ABO/RH: ABO/RH(D): B POS

## 2015-10-17 MED ORDER — SODIUM CHLORIDE 0.9 % IV SOLN
Freq: Once | INTRAVENOUS | Status: AC
  Administered 2015-10-18: 01:00:00 via INTRAVENOUS

## 2015-10-17 MED ORDER — FERROUS SULFATE 325 (65 FE) MG PO TABS
325.0000 mg | ORAL_TABLET | Freq: Two times a day (BID) | ORAL | Status: DC
  Administered 2015-10-18 – 2015-10-22 (×10): 325 mg via ORAL
  Filled 2015-10-17 (×9): qty 1

## 2015-10-17 MED ORDER — ONDANSETRON HCL 4 MG/2ML IJ SOLN: 4.0000 mg | Freq: Four times a day (QID) | INTRAMUSCULAR | Status: DC | PRN

## 2015-10-17 MED ORDER — ALLOPURINOL 100 MG PO TABS
100.0000 mg | ORAL_TABLET | Freq: Every day | ORAL | Status: DC
  Administered 2015-10-18 – 2015-10-21 (×5): 100 mg via ORAL
  Filled 2015-10-17 (×4): qty 1

## 2015-10-17 MED ORDER — HYDROCODONE-ACETAMINOPHEN 5-325 MG PO TABS: 1.0000 | ORAL_TABLET | ORAL | Status: DC | PRN

## 2015-10-17 MED ORDER — PANTOPRAZOLE SODIUM 40 MG IV SOLR
40.0000 mg | Freq: Two times a day (BID) | INTRAVENOUS | Status: DC
  Administered 2015-10-18: 40 mg via INTRAVENOUS
  Filled 2015-10-17: qty 40

## 2015-10-17 MED ORDER — SIMVASTATIN 20 MG PO TABS
40.0000 mg | ORAL_TABLET | Freq: Every day | ORAL | Status: DC
  Administered 2015-10-18 – 2015-10-21 (×5): 40 mg via ORAL
  Filled 2015-10-17 (×5): qty 2

## 2015-10-17 MED ORDER — ONDANSETRON HCL 4 MG PO TABS: 4.0000 mg | ORAL_TABLET | Freq: Four times a day (QID) | ORAL | Status: DC | PRN

## 2015-10-17 MED ORDER — ACETAMINOPHEN 325 MG PO TABS
650.0000 mg | ORAL_TABLET | Freq: Four times a day (QID) | ORAL | Status: DC | PRN
  Administered 2015-10-18 – 2015-10-19 (×2): 650 mg via ORAL
  Filled 2015-10-17 (×2): qty 2

## 2015-10-17 MED ORDER — SODIUM CHLORIDE 0.9 % IV SOLN
80.0000 mg | Freq: Once | INTRAVENOUS | Status: AC
  Administered 2015-10-18: 80 mg via INTRAVENOUS
  Filled 2015-10-17: qty 80

## 2015-10-17 MED ORDER — TAMSULOSIN HCL 0.4 MG PO CAPS
0.4000 mg | ORAL_CAPSULE | Freq: Every day | ORAL | Status: DC
  Administered 2015-10-18 – 2015-10-22 (×5): 0.4 mg via ORAL
  Filled 2015-10-17 (×6): qty 1

## 2015-10-17 MED ORDER — ACETAMINOPHEN 650 MG RE SUPP: 650.0000 mg | Freq: Four times a day (QID) | RECTAL | Status: DC | PRN

## 2015-10-17 MED ORDER — VITAMIN C 100 MG PO TABS: 100.0000 mg | ORAL_TABLET | Freq: Every day | ORAL | Status: DC

## 2015-10-17 MED ORDER — VITAMIN B-6 50 MG PO TABS
50.0000 mg | ORAL_TABLET | Freq: Every day | ORAL | Status: DC
  Administered 2015-10-18 – 2015-10-22 (×5): 50 mg via ORAL
  Filled 2015-10-17 (×6): qty 1

## 2015-10-17 MED ORDER — SODIUM CHLORIDE 0.9 % IV BOLUS (SEPSIS)
1000.0000 mL | Freq: Once | INTRAVENOUS | Status: AC
  Administered 2015-10-17: 1000 mL via INTRAVENOUS

## 2015-10-17 MED ORDER — SODIUM CHLORIDE 0.9% FLUSH
3.0000 mL | Freq: Two times a day (BID) | INTRAVENOUS | Status: DC
  Administered 2015-10-18 – 2015-10-21 (×7): 3 mL via INTRAVENOUS

## 2015-10-17 MED ORDER — DONEPEZIL HCL 5 MG PO TABS
10.0000 mg | ORAL_TABLET | Freq: Every day | ORAL | Status: DC
  Administered 2015-10-18 – 2015-10-21 (×5): 10 mg via ORAL
  Filled 2015-10-17 (×2): qty 2
  Filled 2015-10-17: qty 1
  Filled 2015-10-17 (×2): qty 2
  Filled 2015-10-17: qty 1

## 2015-10-17 NOTE — H&P (Signed)
[facility] History and Physical  Evan Melendez VHQ:[id number] DOB: 05/05/1926 DOA: 10/17/2015  Referring physician: ED physician PCP: Mickie Hillier, MD  Specialists: Dr. Jannifer Franklin (neurology), Dr. Domenic Polite (cardiology), Dr. Gala Romney (GI)   Chief Complaint:  Fatigue, malaise  HPI: Evan Melendez is a 82 y.o. male with PMH of dementia, hypertension, chronic systolic CHF, chronic atrial fibrillation, and remote tobacco abuse presents to the ED with 2 days of nonproductive cough, malaise, and fatigue. Patient lives alone but has a caretaker visits during the day. He had been noted to be much less active the past 2 days and upon questioning, he endorsed a nonproductive cough, malaise, and fatigue. He denied any pain, fevers, nausea, vomiting, or diarrhea. He denied any dysuria. Up until 2 days ago, patient had been in his usual state of health and fairly active throughout the day. Patient denies any recent fall or trauma. He admits to using his medications inconsistently.  In ED, patient was found to be afebrile, saturating adequately on room air, initially with good blood pressure, and then with BP persisting in the 85/50 range. EKG featured atrial fibrillation with T-wave inversions in the anterolateral leads. Chest x-ray featured a 7.7 cm mass in the mid left lung and this was followed by noncontrast chest CT. On chest CT, a 7.8 cm mass was identified in the posterior left upper lobe with a 11 mm satellite nodule in the left lower lobe as well as mediastinal lymphadenopathy. Blood work returned most notable for a hemoglobin of 6.5 and DRE produced melanotic stool that was FOBT positive. Patient was bolused with 1 L of normal saline and 2 units of packed red blood cells were ordered for immediate transfusion. EDP discussed the CT findings with the patient and his daughter. Blood pressure remained on the low side, but the patient remained asymptomatic. He'll be admitted to the stepdown unit for  ongoing evaluation and management of GI bleed with symptomatic anemia.  Where does patient live?   At home    Can patient participate in ADLs?  Some   Review of Systems:   General: no fevers, chills, sweats, weight change, or poor appetite.  Fatigue HEENT: no blurry vision, hearing changes or sore throat Pulm: no wheeze. Dyspnea, non-productive cough CV: no chest pain or palpitations Abd: no nausea, vomiting, abdominal pain, diarrhea, or constipation GU: no dysuria, hematuria, increased urinary frequency, or urgency  Ext: no leg edema Neuro: no focal weakness, numbness, or tingling, no vision change or hearing loss Skin: no rash, no wounds MSK: No muscle spasm, no deformity, no red, hot, or swollen joint Heme: No easy bruising or bleeding Travel history: No recent long distant travel    Allergy:  Allergies  Allergen Reactions  . Quinidine Other (See Comments)    Chest Pain    Past Medical History  Diagnosis Date  . Essential hypertension, benign   . Anemia   . Gout   . ED (erectile dysfunction)   . Mixed hyperlipidemia   . Coronary atherosclerosis of native coronary artery     Multivessel status post CABG  . Impaired fasting glucose   . Insomnia   . CTS (carpal tunnel syndrome)   . Chronic systolic heart failure ([place])   . Myocardial infarction ([place]) 1985  . Ischemic cardiomyopathy     LVEF 30% with severe LVH  . DDD (degenerative disc disease), lumbar   . Hearing difficulty   . Dementia   . Hemorrhoids     Past Surgical History  Procedure Laterality Date  . Appendectomy    . Prostatectomy    . Cholecystectomy    . Tonsillectomy    . Colonoscopy    . Coronary artery bypass graft  2004    Dr. Roxy Manns - LIMA to LAD, SVG to circumflex, SVG to RCA  . Hand surgery Bilateral     Dupuytren's contracture repair bilaterally, right carpal tunnel syndrome surgery  . Nasal sinus surgery    . Colonoscopy  2003    Dr. Lindalou Hose: adenomatous polyp  . Colonoscopy  2010     Dr. Lindalou Hose: tubular adenoma, hyperplastic polyp. Surveillance requested for 2013  . Inguinal hernia repair Right   . Colonoscopy N/A 10/14/2013    Dr. Rourk:Multiple colonic polyps-removed as described above/Minimal internal hemorrhoids-cannot exclude small anal fissure. Tubular adenomas  . Hemorrhoid banding      Social History:  reports that he quit smoking about 32 years ago. His smoking use included Cigarettes. He started smoking about 75 years ago. He has a 90 pack-year smoking history. He has never used smokeless tobacco. He reports that he does not drink alcohol or use illicit drugs.  Family History:  Family History  Problem Relation Age of Onset  . Dementia Father   . Stroke Father   . Rheum arthritis Mother   . Heart disease Mother   . Colon cancer Neg Hx      Prior to Admission medications   Medication Sig Start Date End Date Taking? Authorizing Provider  acetaminophen (TYLENOL) 325 MG tablet Take 650 mg by mouth 2 (two) times daily.   Yes Historical Provider, MD  allopurinol (ZYLOPRIM) 100 MG tablet TAKE 1 TABLET BY MOUTH AT BEDTIME 03/18/15  Yes Mikey Kirschner, MD  Ascorbic Acid (VITAMIN C) 100 MG tablet Take 100 mg by mouth daily.   Yes Historical Provider, MD  aspirin 81 MG tablet Take 81 mg by mouth every morning.    Yes Historical Provider, MD  carvedilol (COREG) 6.25 MG tablet TAKE 1 TABLET BY MOUTH TWICE DAILY 07/06/15  Yes Satira Sark, MD  donepezil (ARICEPT) 10 MG tablet Take 1 tablet (10 mg total) by mouth at bedtime. 03/09/15  Yes Ward Givens, NP  enalapril (VASOTEC) 10 MG tablet TAKE 1 TABLET BY MOUTH EVERY MORNING. 08/04/15  Yes Mikey Kirschner, MD  ferrous sulfate 325 (65 FE) MG tablet Take 325 mg by mouth 2 (two) times daily.    Yes Historical Provider, MD  furosemide (LASIX) 40 MG tablet TAKE 1 TABLET BY MOUTH DAILY. 05/06/15  Yes Mikey Kirschner, MD  hydrocortisone (PROCTOSOL HC) 2.5 % rectal cream APPLY RECTALLYBID AS NEEDED FOR HEMORRHOIDS.  07/14/15  Yes Mikey Kirschner, MD  Lidocaine-Hydrocortisone Ace 2-2 % KIT INSERT RECTALLY TWICE DAILY Patient taking differently: Rectally as needed 11/04/14  Yes Mikey Kirschner, MD  potassium chloride SA (K-DUR,KLOR-CON) 20 MEQ tablet TAKE 2 TABLETS BY MOUTH EVERY MORNING Patient taking differently: TAKE 1 TABLET BY MOUTH 2 times daily 08/04/15  Yes Mikey Kirschner, MD  Pyridoxine HCl (VITAMIN B-6 PO) Take 1 tablet by mouth daily.    Yes Historical Provider, MD  simvastatin (ZOCOR) 40 MG tablet TAKE 1 TABLET BY MOUTH AT BEDTIME 03/18/15  Yes Mikey Kirschner, MD  tamsulosin (FLOMAX) 0.4 MG CAPS capsule TAKE 1 CAPSULE BY MOUTH DAILY AFTER SUPPER 08/04/15  Yes Mikey Kirschner, MD  fexofenadine (ALLEGRA) 180 MG tablet Take 1 tablet (180 mg total) by mouth daily. Patient taking differently: Take 180 mg  by mouth as needed for allergies.  03/13/14   Mikey Kirschner, MD    Physical Exam: Filed Vitals:   10/17/15 2115 10/17/15 2130 10/17/15 2145 10/17/15 2200  BP: '85/50 84/48 84/46 '$ 100/56  Pulse: 61 77 62 100  Temp:      TempSrc:      Resp: '18 20 11 24  '$ Height:      Weight:      SpO2: 96% 93% 93% 94%   General: Not in acute distress. Pale.  HEENT:       Eyes: PERRL, EOMI, no scleral icterus. Pale conjunctiva.       ENT: No discharge from the ears or nose, no pharyngeal ulcers.        Neck: No JVD, no bruit, no appreciable mass Heme: No cervical adenopathy, no pallor Cardiac: S1/S2, irregularly irregular. Pulm: Good air movement bilaterally. No rales, wheezing, rhonchi or rubs. Abd: Soft, nondistended, nontender, no rebound pain or gaurding, no mass or organomegaly, BS present. Ext: No LE edema bilaterally. 2+DP/PT pulse bilaterally. Musculoskeletal: No gross deformity, no red, hot, swollen joints, no limitation in ROM  Skin: No rashes or wounds on exposed surfaces  Neuro: Alert, oriented to person and place, not year, cranial nerves II-XII grossly intact. No focal findings Psych:  Patient is not overtly psychotic, appropriate mood and affect.  Labs on Admission:  Basic Metabolic Panel:  Recent Labs Lab 10/17/15 2050  NA 138  K 4.9  CL 108  CO2 22  GLUCOSE 115*  BUN 44*  CREATININE 1.71*  CALCIUM 7.9*   Liver Function Tests: No results for input(s): AST, ALT, ALKPHOS, BILITOT, PROT, ALBUMIN in the last 168 hours. No results for input(s): LIPASE, AMYLASE in the last 168 hours. No results for input(s): AMMONIA in the last 168 hours. CBC:  Recent Labs Lab 10/17/15 2050  WBC 4.7  NEUTROABS 3.7  HGB 6.5*  HCT 22.6*  MCV 79.6  PLT 134*   Cardiac Enzymes:  Recent Labs Lab 10/17/15 2050  TROPONINI 0.13*    BNP (last 3 results) No results for input(s): BNP in the last 8760 hours.  ProBNP (last 3 results) No results for input(s): PROBNP in the last 8760 hours.  CBG: No results for input(s): GLUCAP in the last 168 hours.  Radiological Exams on Admission: Dg Chest 2 View  10/17/2015  CLINICAL DATA:  Nonproductive cough for 2 days. EXAM: CHEST  2 VIEW COMPARISON:  03/29/2013 FINDINGS: 7.7 cm diameter masslike opacity in the left mid lung. This could represent rounded pneumonia but appearance is highly suspicious for neoplasm. Emphysematous changes are suggested in the lungs. Heart size and pulmonary vascularity are normal. No blunting of costophrenic angles. No pneumothorax. Calcified and tortuous aorta. Postoperative changes in the mediastinum. IMPRESSION: 7.7 cm diameter mass in the left mid lung highly suspicious for neoplasm. Rounded pneumonia less likely. Electronically Signed   By: Lucienne Capers M.D.   On: 10/17/2015 19:16   Ct Chest Wo Contrast  10/17/2015  CLINICAL DATA:  Cough, abnormal chest radiograph EXAM: CT CHEST WITHOUT CONTRAST TECHNIQUE: Multidetector CT imaging of the chest was performed following the standard protocol without IV contrast. COMPARISON:  Chest radiograph dated 10/17/2015 FINDINGS: Mediastinum/Nodes: Cardiomegaly. No  pericardial effusion. Hypodense blood pool relative to myocardium, suggesting anemia. Three vessel coronary atherosclerosis. Postsurgical changes related to prior CABG. Extensive atherosclerotic calcifications the abdominal aorta and branch vessels. Mediastinal lymphadenopathy, including: --14 mm short axis low right paratracheal node (series 2/ image 24) --11 mm short axis  left paratracheal/ AP window node (series 2/ image 25) --10 mm short axis subcarinal node (series 2/image 29) Visualized thyroid is unremarkable. Lungs/Pleura: 6.9 x 7.3 x 7.8 cm posterior left upper lobe mass, abutting the lateral aspect of the left chest wall, and abutting the left fissure and likely transgressing the left lower lobe (sagittal image 87). 11 mm satellite nodule in the superior segment left lower lobe (series 4/ image 26), suspicious for metastasis. Underlying mild centrilobular emphysematous changes. Small left and trace right pleural effusions.  No pneumothorax. Upper abdomen: Visualized upper abdomen is notable for vascular calcifications and small volume perihepatic ascites. Musculoskeletal: Degenerative changes of the visualized thoracolumbar spine. Median sternotomy. IMPRESSION: 7.8 cm posterior left upper lobe mass, as described above. Mass abuts the left chest wall and left fissure, possibly involving the left lower lobe. 11 mm satellite nodule in the superior segment left lower lobe, suspicious for metastasis. Associated mediastinal lymphadenopathy. Small left pleural effusion and trace right pleural effusion. Electronically Signed   By: Julian Hy M.D.   On: 10/17/2015 20:19    EKG: Independently reviewed.  Abnormal findings:  Atrial fibrillation, LAD, anterolateral T-wave inversions   Assessment/Plan  1. GI bleed with symptomatic anemia  - Hgb 6.5 on admission, MCV 79.6; BUN 44 with SCr 1.7 - Stool is melanotic and FOBT+  - 2 units pRBC ordered for immediate transfusion  - Hold ASA 81; hold  pharmacologic VTE ppx  - Protonix 80 mg IV given now, then 40 mg q12h  - No abd pain, indigestion, or nausea - Colonoscopy performed March 2015 with polypectomy (tubular adenomas without high-grade dysplasia), but no mention of diverticula or AVM  - RN to place order for post-transfusion H/H   2. Left lung mass - Appears malignant with satellite nodule and mediastinal LAD  - Discussed with pt (forgot already d/t dementia) and his daughter; daughter will convene with her brother to decide on goals of care   3. Chronic systolic CHF  - TTE (2/70/62) EF 30%, severe LVH  - Given ischemic cardiomyopathy, will keep sats close to 100% while severely anemic - BP is soft in ED and there is no suggestion of volume overload; holding home-dose Lasix  - Do not anticipate need for diuresis between or after transfusions - Strict I/O, daily wts, fluid-restrict diet; resume Lasix when appropriate   4. CKD stage III - SCr 1.71 on admission, which appears to be his baseline  - Appears dry clinically - Received 1 L NS bolus in ED and will receive 2 units pRBCs once available  - Repeat chem panel in am  - Holding Lasix and enalapril for now    5. Atrial fibrillation, chronic  - Rate is well-controlled, has tendency to run low per notes  - Holding Coreg for now given marginal BPs  - Monitoring on telemetry  - CHADS-VASc 4 (age x2, CHF, HTN), not anticoagulated  - Holding ASA 81 given GIB   6. Chronic thrombocytopenia  - Platelet count 134,000 on arrival, appears consistent with his baseline  - Transfusing pRBCs - Monitor   7. Hypertension  - BP has been marginal in ED - Holding enalapril and Coreg d/t low BP, plan to resume as needed once stabilized    DVT ppx:  SCDs  Code Status: Full code Family Communication:  Yes, patient's son and daughter at bed side Disposition Plan: Admit to inpatient   Date of Service 10/17/2015    Vianne Bulls, MD [facility] Pager [phone]  If  7PM-7AM, please contact night-coverage [email or web address] Password [facility] 10/17/2015, 10:23 PM

## 2015-10-17 NOTE — ED Notes (Signed)
CRITICAL VALUE ALERT  Critical value received:  Hemoglobin 6.5  Date of notification:  10/17/15  Time of notification:  2130  Critical value read back:Yes.    Nurse who received alert:  Lucy Antigua, RN  Responding MD:  Dr. Tanna Furry  Time MD responded:  2131

## 2015-10-17 NOTE — ED Provider Notes (Signed)
CSN: [id number]     Arrival date & time 10/17/15  1817 History   First MD Initiated Contact with Patient 10/17/15 1857     Chief Complaint  Patient presents with  . Cough      HPI  Patient is an 82 year old male who presents with weakness and a cough.  Evan Melendez has a history of mild dementia. He lives alone in [place]. His daughter is his closest family member who lives in [place]. She has security cameras in the patient's home. She had noted today that he had not been up around and called him. He states that he didn't feel well and she drove over and picked him up and brought him to the hospital. He had not been out of bed all day complains of being weak and has had a cough.  Denies pain. No headache. No vision changes. No chest pain. Cough but no hemoptysis. He thinks he may have lost 6 or 7 pounds over the last month. No abdominal pain nausea or vomiting. Has not noticed blood in his stools or black stools. No dysuria. No extremity weakness or additional symptoms.   History of anemia, new onset atrial fibrillation in January of this year not requiring rate control, only on aspirin. Hypertension, dementia, congestive heart failure, hemorrhoids, or cholesterolemia, BPH.   Past Medical History  Diagnosis Date  . Essential hypertension, benign   . Anemia   . Gout   . ED (erectile dysfunction)   . Mixed hyperlipidemia   . Coronary atherosclerosis of native coronary artery     Multivessel status post CABG  . Impaired fasting glucose   . Insomnia   . CTS (carpal tunnel syndrome)   . Chronic systolic heart failure ([place])   . Myocardial infarction ([place]) 1985  . Ischemic cardiomyopathy     LVEF 30% with severe LVH  . DDD (degenerative disc disease), lumbar   . Hearing difficulty   . Dementia   . Hemorrhoids    Past Surgical History  Procedure Laterality Date  . Appendectomy    . Prostatectomy    . Cholecystectomy    . Tonsillectomy    . Colonoscopy    . Coronary artery bypass  graft  2004    Dr. Roxy Manns - LIMA to LAD, SVG to circumflex, SVG to RCA  . Hand surgery Bilateral     Dupuytren's contracture repair bilaterally, right carpal tunnel syndrome surgery  . Nasal sinus surgery    . Colonoscopy  2003    Dr. Lindalou Hose: adenomatous polyp  . Colonoscopy  2010    Dr. Lindalou Hose: tubular adenoma, hyperplastic polyp. Surveillance requested for 2013  . Inguinal hernia repair Right   . Colonoscopy N/A 10/14/2013    Dr. Rourk:Multiple colonic polyps-removed as described above/Minimal internal hemorrhoids-cannot exclude small anal fissure. Tubular adenomas  . Hemorrhoid banding     Family History  Problem Relation Age of Onset  . Dementia Father   . Stroke Father   . Rheum arthritis Mother   . Heart disease Mother   . Colon cancer Neg Hx    Social History  Substance Use Topics  . Smoking status: Former Smoker -- 3.00 packs/day for 30 years    Types: Cigarettes    Start date: 06/04/1940    Quit date: 10/15/1983  . Smokeless tobacco: Never Used  . Alcohol Use: No    Review of Systems  Constitutional: Positive for activity change and fatigue. Negative for fever, chills, diaphoresis and appetite change.  HENT: Positive for  congestion. Negative for mouth sores, sore throat and trouble swallowing.   Eyes: Negative.  Negative for visual disturbance.  Respiratory: Positive for cough and shortness of breath. Negative for chest tightness and wheezing.   Cardiovascular: Negative for chest pain.  Gastrointestinal: Negative for nausea, vomiting, abdominal pain, diarrhea, blood in stool and abdominal distention.  Endocrine: Negative.  Negative for polydipsia, polyphagia and polyuria.  Genitourinary: Negative for dysuria, frequency, hematuria and decreased urine volume.  Musculoskeletal: Negative for gait problem.  Skin: Negative for color change, pallor and rash.  Neurological: Positive for weakness. Negative for dizziness, syncope, light-headedness and headaches.   Hematological: Does not bruise/bleed easily.  Psychiatric/Behavioral: Negative for behavioral problems and confusion.      Allergies  Quinidine  Home Medications   Prior to Admission medications   Medication Sig Start Date End Date Taking? Authorizing Provider  acetaminophen (TYLENOL) 325 MG tablet Take 650 mg by mouth 2 (two) times daily.   Yes Historical Provider, MD  allopurinol (ZYLOPRIM) 100 MG tablet TAKE 1 TABLET BY MOUTH AT BEDTIME 03/18/15  Yes Mikey Kirschner, MD  Ascorbic Acid (VITAMIN C) 100 MG tablet Take 100 mg by mouth daily.   Yes Historical Provider, MD  aspirin 81 MG tablet Take 81 mg by mouth every morning.    Yes Historical Provider, MD  carvedilol (COREG) 6.25 MG tablet TAKE 1 TABLET BY MOUTH TWICE DAILY 07/06/15  Yes Satira Sark, MD  donepezil (ARICEPT) 10 MG tablet Take 1 tablet (10 mg total) by mouth at bedtime. 03/09/15  Yes Ward Givens, NP  enalapril (VASOTEC) 10 MG tablet TAKE 1 TABLET BY MOUTH EVERY MORNING. 08/04/15  Yes Mikey Kirschner, MD  ferrous sulfate 325 (65 FE) MG tablet Take 325 mg by mouth 2 (two) times daily.    Yes Historical Provider, MD  furosemide (LASIX) 40 MG tablet TAKE 1 TABLET BY MOUTH DAILY. 05/06/15  Yes Mikey Kirschner, MD  hydrocortisone (PROCTOSOL HC) 2.5 % rectal cream APPLY RECTALLYBID AS NEEDED FOR HEMORRHOIDS. 07/14/15  Yes Mikey Kirschner, MD  Lidocaine-Hydrocortisone Ace 2-2 % KIT INSERT RECTALLY TWICE DAILY Patient taking differently: Rectally as needed 11/04/14  Yes Mikey Kirschner, MD  potassium chloride SA (K-DUR,KLOR-CON) 20 MEQ tablet TAKE 2 TABLETS BY MOUTH EVERY MORNING Patient taking differently: TAKE 1 TABLET BY MOUTH 2 times daily 08/04/15  Yes Mikey Kirschner, MD  Pyridoxine HCl (VITAMIN B-6 PO) Take 1 tablet by mouth daily.    Yes Historical Provider, MD  simvastatin (ZOCOR) 40 MG tablet TAKE 1 TABLET BY MOUTH AT BEDTIME 03/18/15  Yes Mikey Kirschner, MD  tamsulosin (FLOMAX) 0.4 MG CAPS capsule TAKE 1  CAPSULE BY MOUTH DAILY AFTER SUPPER 08/04/15  Yes Mikey Kirschner, MD  fexofenadine (ALLEGRA) 180 MG tablet Take 1 tablet (180 mg total) by mouth daily. Patient taking differently: Take 180 mg by mouth as needed for allergies.  03/13/14   Mikey Kirschner, MD   BP 85/50 mmHg  Pulse 61  Temp(Src) 98 F (36.7 C) (Rectal)  Resp 18  Ht '5\' 10"'$  (1.778 m)  Wt 146 lb (66.225 kg)  BMI 20.95 kg/m2  SpO2 96% Physical Exam  Constitutional: He is oriented to person, place, and time. He appears well-developed and well-nourished. He appears ill. No distress.  Laying on his left side. Complains of being fatigued. Conjunctiva appear pale. Awake and conversant.  HENT:  Head: Normocephalic.  Conjunctivae are pale.  Eyes: Conjunctivae are normal. Pupils are equal, round, and  reactive to light. No scleral icterus.  Neck: Normal range of motion. Neck supple. No thyromegaly present.  Cardiovascular: Normal rate and regular rhythm.  Exam reveals no gallop and no friction rub.   No murmur heard. Not hypotensive initially. A. fib with controlled rate.  Pulmonary/Chest: Effort normal and breath sounds normal. No respiratory distress. He has no wheezes. He has no rales.  Crackles midlung left greater than right.  Abdominal: Soft. Bowel sounds are normal. He exhibits no distension. There is no tenderness. There is no rebound.  Abdomen soft. No masses. Rectal is black stool guaiac positive.  Musculoskeletal: Normal range of motion.  Neurological: He is alert and oriented to person, place, and time.  General his weakness. Moving all 4. Awake alert and oriented 3.  Skin: Skin is warm and dry. No rash noted.  Psychiatric: He has a normal mood and affect. His behavior is normal.    ED Course  Procedures (including critical care time) Labs Review Labs Reviewed  CBC WITH DIFFERENTIAL/PLATELET - Abnormal; Notable for the following:    RBC 2.84 (*)    Hemoglobin 6.5 (*)    HCT 22.6 (*)    MCH 22.9 (*)    MCHC  28.8 (*)    RDW 18.4 (*)    Platelets 134 (*)    Lymphs Abs 0.6 (*)    All other components within normal limits  BASIC METABOLIC PANEL - Abnormal; Notable for the following:    Glucose, Bld 115 (*)    BUN 44 (*)    Creatinine, Ser 1.71 (*)    Calcium 7.9 (*)    GFR calc non Af Amer 34 (*)    GFR calc Af Amer 40 (*)    All other components within normal limits  TROPONIN I - Abnormal; Notable for the following:    Troponin I 0.13 (*)    All other components within normal limits  POC OCCULT BLOOD, ED - Abnormal; Notable for the following:    Fecal Occult Bld POSITIVE (*)    All other components within normal limits  CULTURE, BLOOD (ROUTINE X 2)  CULTURE, BLOOD (ROUTINE X 2)  URINE CULTURE  URINALYSIS, ROUTINE W REFLEX MICROSCOPIC (NOT AT [facility])  I-STAT CG4 LACTIC ACID, ED  PREPARE RBC (CROSSMATCH)  TYPE AND SCREEN    Imaging Review Dg Chest 2 View  10/17/2015  CLINICAL DATA:  Nonproductive cough for 2 days. EXAM: CHEST  2 VIEW COMPARISON:  03/29/2013 FINDINGS: 7.7 cm diameter masslike opacity in the left mid lung. This could represent rounded pneumonia but appearance is highly suspicious for neoplasm. Emphysematous changes are suggested in the lungs. Heart size and pulmonary vascularity are normal. No blunting of costophrenic angles. No pneumothorax. Calcified and tortuous aorta. Postoperative changes in the mediastinum. IMPRESSION: 7.7 cm diameter mass in the left mid lung highly suspicious for neoplasm. Rounded pneumonia less likely. Electronically Signed   By: Burman Nieves M.D.   On: 10/17/2015 19:16   Ct Chest Wo Contrast  10/17/2015  CLINICAL DATA:  Cough, abnormal chest radiograph EXAM: CT CHEST WITHOUT CONTRAST TECHNIQUE: Multidetector CT imaging of the chest was performed following the standard protocol without IV contrast. COMPARISON:  Chest radiograph dated 10/17/2015 FINDINGS: Mediastinum/Nodes: Cardiomegaly. No pericardial effusion. Hypodense blood pool relative to  myocardium, suggesting anemia. Three vessel coronary atherosclerosis. Postsurgical changes related to prior CABG. Extensive atherosclerotic calcifications the abdominal aorta and branch vessels. Mediastinal lymphadenopathy, including: --14 mm short axis low right paratracheal node (series 2/ image 24) --11  mm short axis left paratracheal/ AP window node (series 2/ image 25) --10 mm short axis subcarinal node (series 2/image 29) Visualized thyroid is unremarkable. Lungs/Pleura: 6.9 x 7.3 x 7.8 cm posterior left upper lobe mass, abutting the lateral aspect of the left chest wall, and abutting the left fissure and likely transgressing the left lower lobe (sagittal image 87). 11 mm satellite nodule in the superior segment left lower lobe (series 4/ image 26), suspicious for metastasis. Underlying mild centrilobular emphysematous changes. Small left and trace right pleural effusions.  No pneumothorax. Upper abdomen: Visualized upper abdomen is notable for vascular calcifications and small volume perihepatic ascites. Musculoskeletal: Degenerative changes of the visualized thoracolumbar spine. Median sternotomy. IMPRESSION: 7.8 cm posterior left upper lobe mass, as described above. Mass abuts the left chest wall and left fissure, possibly involving the left lower lobe. 11 mm satellite nodule in the superior segment left lower lobe, suspicious for metastasis. Associated mediastinal lymphadenopathy. Small left pleural effusion and trace right pleural effusion. Electronically Signed   By: Julian Hy M.D.   On: 10/17/2015 20:19   I have personally reviewed and evaluated these images and lab results as part of my medical decision-making.   EKG Interpretation   Date/Time:  Saturday October 17 2015 19:43:47 EDT Ventricular Rate:  92 PR Interval:    QRS Duration: 158 QT Interval:  376 QTC Calculation: 465 R Axis:   -84 Text Interpretation:  Atrial fibrillation Nonspecific IVCD with LAD  Borderline abnrm T,  anterolateral leads Confirmed by Jeneen Rinks  MD, [place]  [id number]) on 10/17/2015 7:48:08 PM      MDM   Final diagnoses:  Weakness  Anemia, unspecified anemia type  Upper GI bleeding  Lung tumor  Hypotension, unspecified hypotension type    Patient with atrial fibrillation. This was noted to be new in cardiology, Dr. Myles Gip office in January of this year. Decision made to simple continue on aspirin. He is on Coreg. No other rate control medications.  Blood pressure initially 110 to 1:15 upon his arrival. He has deterioration of systolics of 90 and 75. Bolus initiated. Found to be anemic with hemoglobin 6.5. History of chronic anemia. Followed by an oncologist in [place] and had previously undergone transfusion an unknown number of years ago. He has black, guaiac positive stools. He did have "normal" colonoscopy less than 5 years ago per daughter, "other than 2 polyps removed". Has never noted black stools or had any bright red blood per rectum.  Problem list includes hypertension, anemia, GI bleed, new diagnosis left lung mass with multiple thoracic/mediastinal lymph nodes.  I discussed the situation at length with the daughter who is here with him. Patient lives here independently with a health care home provider Monday through Friday. Daughter lives in [place]. Daughter, and patient's son who is in route share power of attorney. Patient does NOT have a living Will or DO NOT RESUSCITATE.  Daughter states that he has talked to her a lot lately that he is not happy. She does not think that he would want to have aggressive care. I discussed this with him. He is unable to tell me currently the amount of care resuscitation or diagnostics or treatment he would want to undergo. He does consent to blood transfusion.    Tanna Furry, MD 10/17/15 2153

## 2015-10-17 NOTE — ED Notes (Signed)
Patient reports cough x 2 days, non productive. Pt. Lives alone but has a dementia and a caregiver that comes daily. Family states patient has been "laying around" and this is not his usual. Unsure of fevers. Pt. Denies body aches, n/v/d.

## 2015-10-18 ENCOUNTER — Inpatient Hospital Stay (HOSPITAL_COMMUNITY): Payer: Medicare Other

## 2015-10-18 DIAGNOSIS — R778 Other specified abnormalities of plasma proteins: Secondary | ICD-10-CM | POA: Diagnosis present

## 2015-10-18 DIAGNOSIS — R059 Cough, unspecified: Secondary | ICD-10-CM | POA: Diagnosis present

## 2015-10-18 DIAGNOSIS — R7989 Other specified abnormal findings of blood chemistry: Secondary | ICD-10-CM

## 2015-10-18 DIAGNOSIS — N183 Chronic kidney disease, stage 3 (moderate): Secondary | ICD-10-CM

## 2015-10-18 DIAGNOSIS — I351 Nonrheumatic aortic (valve) insufficiency: Secondary | ICD-10-CM

## 2015-10-18 DIAGNOSIS — D649 Anemia, unspecified: Secondary | ICD-10-CM

## 2015-10-18 DIAGNOSIS — R05 Cough: Secondary | ICD-10-CM

## 2015-10-18 DIAGNOSIS — J101 Influenza due to other identified influenza virus with other respiratory manifestations: Secondary | ICD-10-CM | POA: Diagnosis present

## 2015-10-18 DIAGNOSIS — K921 Melena: Principal | ICD-10-CM | POA: Insufficient documentation

## 2015-10-18 DIAGNOSIS — K922 Gastrointestinal hemorrhage, unspecified: Secondary | ICD-10-CM

## 2015-10-18 DIAGNOSIS — I10 Essential (primary) hypertension: Secondary | ICD-10-CM

## 2015-10-18 DIAGNOSIS — I959 Hypotension, unspecified: Secondary | ICD-10-CM

## 2015-10-18 DIAGNOSIS — I5022 Chronic systolic (congestive) heart failure: Secondary | ICD-10-CM

## 2015-10-18 DIAGNOSIS — D62 Acute posthemorrhagic anemia: Secondary | ICD-10-CM

## 2015-10-18 DIAGNOSIS — E782 Mixed hyperlipidemia: Secondary | ICD-10-CM

## 2015-10-18 LAB — URINALYSIS, ROUTINE W REFLEX MICROSCOPIC
Bilirubin Urine: NEGATIVE
GLUCOSE, UA: NEGATIVE mg/dL
Ketones, ur: NEGATIVE mg/dL
Leukocytes, UA: NEGATIVE
Nitrite: NEGATIVE
Protein, ur: 30 mg/dL — AB
SPECIFIC GRAVITY, URINE: 1.015 (ref 1.005–1.030)
pH: 5 (ref 5.0–8.0)

## 2015-10-18 LAB — COMPREHENSIVE METABOLIC PANEL
ALT: 77 U/L — AB (ref 17–63)
ANION GAP: 8 (ref 5–15)
AST: 81 U/L — ABNORMAL HIGH (ref 15–41)
Albumin: 2.8 g/dL — ABNORMAL LOW (ref 3.5–5.0)
Alkaline Phosphatase: 111 U/L (ref 38–126)
BUN: 43 mg/dL — ABNORMAL HIGH (ref 6–20)
CHLORIDE: 109 mmol/L (ref 101–111)
CO2: 21 mmol/L — AB (ref 22–32)
CREATININE: 1.66 mg/dL — AB (ref 0.61–1.24)
Calcium: 8 mg/dL — ABNORMAL LOW (ref 8.9–10.3)
GFR, EST AFRICAN AMERICAN: 41 mL/min — AB (ref >60)
GFR, EST NON AFRICAN AMERICAN: 35 mL/min — AB (ref >60)
Glucose, Bld: 145 mg/dL — ABNORMAL HIGH (ref 65–99)
Potassium: 5.1 mmol/L (ref 3.5–5.1)
SODIUM: 138 mmol/L (ref 135–145)
Total Bilirubin: 1.3 mg/dL — ABNORMAL HIGH (ref 0.3–1.2)
Total Protein: 5.2 g/dL — ABNORMAL LOW (ref 6.5–8.1)

## 2015-10-18 LAB — INFLUENZA PANEL BY PCR (TYPE A & B)
H1N1 flu by pcr: NOT DETECTED
INFLAPCR: POSITIVE — AB
INFLBPCR: NEGATIVE

## 2015-10-18 LAB — URINE MICROSCOPIC-ADD ON

## 2015-10-18 LAB — HEMOGLOBIN AND HEMATOCRIT, BLOOD
HCT: 30.5 % — ABNORMAL LOW (ref 39.0–52.0)
HCT: 31.5 % — ABNORMAL LOW (ref 39.0–52.0)
Hemoglobin: 9.3 g/dL — ABNORMAL LOW (ref 13.0–17.0)
Hemoglobin: 9.5 g/dL — ABNORMAL LOW (ref 13.0–17.0)

## 2015-10-18 LAB — GLUCOSE, CAPILLARY: Glucose-Capillary: 101 mg/dL — ABNORMAL HIGH (ref 65–99)

## 2015-10-18 LAB — TROPONIN I
TROPONIN I: 0.09 ng/mL — AB (ref <0.031)
Troponin I: 0.08 ng/mL — ABNORMAL HIGH (ref <0.031)

## 2015-10-18 LAB — ECHOCARDIOGRAM COMPLETE
HEIGHTINCHES: 67 in
WEIGHTICAEL: 2349.22 [oz_av]

## 2015-10-18 LAB — MRSA PCR SCREENING: MRSA BY PCR: NEGATIVE

## 2015-10-18 MED ORDER — ZOLPIDEM TARTRATE 5 MG PO TABS
5.0000 mg | ORAL_TABLET | Freq: Every evening | ORAL | Status: DC | PRN
  Administered 2015-10-18 – 2015-10-21 (×3): 5 mg via ORAL
  Filled 2015-10-18 (×4): qty 1

## 2015-10-18 MED ORDER — OSELTAMIVIR PHOSPHATE 75 MG PO CAPS
75.0000 mg | ORAL_CAPSULE | Freq: Two times a day (BID) | ORAL | Status: DC
  Administered 2015-10-18 – 2015-10-19 (×2): 75 mg via ORAL
  Filled 2015-10-18 (×2): qty 1

## 2015-10-18 MED ORDER — VITAMIN C 500 MG PO TABS
250.0000 mg | ORAL_TABLET | Freq: Every day | ORAL | Status: DC
  Administered 2015-10-18 – 2015-10-22 (×5): 250 mg via ORAL
  Filled 2015-10-18 (×7): qty 1

## 2015-10-18 MED ORDER — PANTOPRAZOLE SODIUM 40 MG IV SOLR
INTRAVENOUS | Status: AC
  Filled 2015-10-18: qty 80

## 2015-10-18 MED ORDER — PANTOPRAZOLE SODIUM 40 MG IV SOLR: 40.0000 mg | Freq: Two times a day (BID) | INTRAVENOUS | Status: DC

## 2015-10-18 MED ORDER — SODIUM CHLORIDE 0.9 % IV SOLN
8.0000 mg/h | INTRAVENOUS | Status: DC
  Administered 2015-10-18 – 2015-10-19 (×3): 8 mg/h via INTRAVENOUS
  Filled 2015-10-18 (×3): qty 80

## 2015-10-18 MED ORDER — BENZONATATE 100 MG PO CAPS
200.0000 mg | ORAL_CAPSULE | Freq: Three times a day (TID) | ORAL | Status: DC
  Administered 2015-10-18 – 2015-10-19 (×6): 200 mg via ORAL
  Filled 2015-10-18 (×7): qty 2

## 2015-10-18 NOTE — Progress Notes (Signed)
[facility] PROGRESS NOTE  Evan Melendez TIR:[id number] DOB: [birth year] DOA: 10/17/2015 PCP: Mickie Hillier, MD  Assessment/Plan: #1 symptomatic anemia/probable GI bleed Patient had presented with generalized malaise and fatigue and a cough. Patient noted admission to have a hemoglobin of 6.5. Digital rectal exam done noted melanotic stool. FOBT was positive. Patient denies any insect use however is on aspirin daily. Patient denies any overt melanotic bleeding. Patient was given Protonix 80 mg IV push 1. Will place on a Protonix drip. Serial H/H. aspirin on hold. Patient status post 2 units packed red blood cells with hemoglobin currently at 9.3. Consult with GI for further evaluation and management.  #2 acute blood loss anemia Secondary to problem #1. Status post transfusion of 2 units packed red blood cells.  #3 elevated troponin Likely secondary to demand ischemia secondary to problem #1. Elevated troponins seem to have plateaued. Patient denies any chest pain. EKG with A. fib with borderline T-wave abnormalities. Check a 2-D echo. Beta blocker and ACE inhibitor on hold secondary to hypotension on admission. Her blood pressure stabilizes over the next 24 hours may resume beta blocker and ACE inhibitor.  #4 cough Questionable etiology. Chest x-ray and CT chest negative for pneumonia however consistent with a lung mass which might be the etiology of his cough. Will check an influenza PCR. Tessalon Perles 3 times daily. Follow.  #5 atrial fibrillation CHADS2VASC = 4 Heart rate currently controlled. Beta blocker on hold secondary to hypotension on admission. Unable to place on anticoagulation at this time due to problem #1.  #6 chronic thrombocytopenia Stable. Monitor.  #7 chronic kidney disease stage III Stable.  #8 hypotension Likely secondary to problem #1. Improved with IV fluids and transfusion. Antihypertensive medications on hold. Follow.  #9 chronic systolic heart  failure 2-D echo from 09/14/2012 with a EF of 30% with severe LVH. Patient was initially hypotensive on admission and as such diuretics and beta blocker and ACE inhibitor on hold. Once blood pressure stabilizes will resume cardiac medications.  #10 lung mass Concern for cancerous lesion and probable metastases. Family states they were told by the doctor on admission that though be seen by pulmonary and awaiting a pulmonary evaluation. Will place a pulmonary consultation for further evaluation and management.  #11 Hypertension Patient noted to be hypotensive on admission and a such antihypertensive medications have been held. Follow.  #12 prophylaxis PPI for GI prophylaxis. SCDs for DVT prophylaxis.   Code Status: Full Family Communication: Updated patient and daughter at bedside. Disposition Plan: Home once medically stable and GI bleed has been evaluated as well as lung mass and once hemoglobin has stabilized and no further bleeding.   Consultants:  Gastroenterology pending  Pulmonary pending  Procedures:  2 units packed red blood cells 10/18/2015.  Chest x-ray 10/17/2015  CT chest 10/17/2015   Antibiotics:  None  HPI/Subjective: Patient cough in room. Patient states he feels better. Patient not sure why he is here however daughter states they brought him in because of cough. Patient denies any melanotic stool prior to admission. Patient denies any NSAID use except occasional aspirin. No chest pain. No shortness of breath.  Objective: Filed Vitals:   10/18/15 0600 10/18/15 0820  BP: 122/72   Pulse:    Temp: 97.3 F (36.3 C) 98.1 F (36.7 C)  Resp: 23     Intake/Output Summary (Last 24 hours) at 10/18/15 0934 Last data filed at 10/18/15 0855  Gross per 24 hour  Intake   1150 ml  Output  0 ml  Net   1150 ml   Filed Weights   10/17/15 1845 10/17/15 2325  Weight: 66.225 kg (146 lb) 66.6 kg (146 lb 13.2 oz)    Exam:   General:  NAD  Cardiovascular:  Irregularly irregular  Respiratory: Some scattered coarse BS. No wheezing. No crackles.  Abdomen: Soft, nontender, nondistended, positive bowel sounds.  Musculoskeletal: No clubbing cyanosis or edema.  Data Reviewed: Basic Metabolic Panel:  Recent Labs Lab 10/17/15 2050  NA 138  K 4.9  CL 108  CO2 22  GLUCOSE 115*  BUN 44*  CREATININE 1.71*  CALCIUM 7.9*   Liver Function Tests: No results for input(s): AST, ALT, ALKPHOS, BILITOT, PROT, ALBUMIN in the last 168 hours. No results for input(s): LIPASE, AMYLASE in the last 168 hours. No results for input(s): AMMONIA in the last 168 hours. CBC:  Recent Labs Lab 10/17/15 2050 10/18/15 0901  WBC 4.7  --   NEUTROABS 3.7  --   HGB 6.5* 9.3*  HCT 22.6* 30.5*  MCV 79.6  --   PLT 134*  --    Cardiac Enzymes:  Recent Labs Lab 10/17/15 2050  TROPONINI 0.13*  0.13*   BNP (last 3 results) No results for input(s): BNP in the last 8760 hours.  ProBNP (last 3 results) No results for input(s): PROBNP in the last 8760 hours.  CBG:  Recent Labs Lab 10/18/15 0803  GLUCAP 101*    Recent Results (from the past 240 hour(s))  Culture, blood (Routine X 2) w Reflex to ID Panel     Status: None (Preliminary result)   Collection Time: 10/17/15  8:50 PM  Result Value Ref Range Status   Specimen Description BLOOD RIGHT ARM  Final   Special Requests BOTTLES DRAWN AEROBIC AND ANAEROBIC 6CC  Final   Culture PENDING  Incomplete   Report Status PENDING  Incomplete  Culture, blood (Routine X 2) w Reflex to ID Panel     Status: None (Preliminary result)   Collection Time: 10/17/15  8:57 PM  Result Value Ref Range Status   Specimen Description RIGHT ANTECUBITAL  Final   Special Requests BOTTLES DRAWN AEROBIC AND ANAEROBIC [facility] EACH  Final   Culture PENDING  Incomplete   Report Status PENDING  Incomplete  MRSA PCR Screening     Status: None   Collection Time: 10/17/15 11:30 PM  Result Value Ref Range Status   MRSA by PCR NEGATIVE  NEGATIVE Final    Comment:        The GeneXpert MRSA Assay (FDA approved for NASAL specimens only), is one component of a comprehensive MRSA colonization surveillance program. It is not intended to diagnose MRSA infection nor to guide or monitor treatment for MRSA infections.      Studies: Dg Chest 2 View  10/17/2015  CLINICAL DATA:  Nonproductive cough for 2 days. EXAM: CHEST  2 VIEW COMPARISON:  03/29/2013 FINDINGS: 7.7 cm diameter masslike opacity in the left mid lung. This could represent rounded pneumonia but appearance is highly suspicious for neoplasm. Emphysematous changes are suggested in the lungs. Heart size and pulmonary vascularity are normal. No blunting of costophrenic angles. No pneumothorax. Calcified and tortuous aorta. Postoperative changes in the mediastinum. IMPRESSION: 7.7 cm diameter mass in the left mid lung highly suspicious for neoplasm. Rounded pneumonia less likely. Electronically Signed   By: Lucienne Capers M.D.   On: 10/17/2015 19:16   Ct Chest Wo Contrast  10/17/2015  CLINICAL DATA:  Cough, abnormal chest radiograph EXAM: CT  CHEST WITHOUT CONTRAST TECHNIQUE: Multidetector CT imaging of the chest was performed following the standard protocol without IV contrast. COMPARISON:  Chest radiograph dated 10/17/2015 FINDINGS: Mediastinum/Nodes: Cardiomegaly. No pericardial effusion. Hypodense blood pool relative to myocardium, suggesting anemia. Three vessel coronary atherosclerosis. Postsurgical changes related to prior CABG. Extensive atherosclerotic calcifications the abdominal aorta and branch vessels. Mediastinal lymphadenopathy, including: --14 mm short axis low right paratracheal node (series 2/ image 24) --11 mm short axis left paratracheal/ AP window node (series 2/ image 25) --10 mm short axis subcarinal node (series 2/image 29) Visualized thyroid is unremarkable. Lungs/Pleura: 6.9 x 7.3 x 7.8 cm posterior left upper lobe mass, abutting the lateral aspect of the  left chest wall, and abutting the left fissure and likely transgressing the left lower lobe (sagittal image 87). 11 mm satellite nodule in the superior segment left lower lobe (series 4/ image 26), suspicious for metastasis. Underlying mild centrilobular emphysematous changes. Small left and trace right pleural effusions.  No pneumothorax. Upper abdomen: Visualized upper abdomen is notable for vascular calcifications and small volume perihepatic ascites. Musculoskeletal: Degenerative changes of the visualized thoracolumbar spine. Median sternotomy. IMPRESSION: 7.8 cm posterior left upper lobe mass, as described above. Mass abuts the left chest wall and left fissure, possibly involving the left lower lobe. 11 mm satellite nodule in the superior segment left lower lobe, suspicious for metastasis. Associated mediastinal lymphadenopathy. Small left pleural effusion and trace right pleural effusion. Electronically Signed   By: Julian Hy M.D.   On: 10/17/2015 20:19    Scheduled Meds: . allopurinol  100 mg Oral QHS  . benzonatate  200 mg Oral TID  . donepezil  10 mg Oral QHS  . ferrous sulfate  325 mg Oral BID  . [START ON 10/21/2015] pantoprazole (PROTONIX) IV  40 mg Intravenous Q12H  . vitamin B-6  50 mg Oral Daily  . simvastatin  40 mg Oral QHS  . sodium chloride flush  3 mL Intravenous Q12H  . tamsulosin  0.4 mg Oral Daily  . vitamin C  250 mg Oral Daily   Continuous Infusions: . pantoprozole (PROTONIX) infusion      Principal Problem:   Symptomatic anemia Active Problems:   GI bleed   Acute blood loss anemia   Essential hypertension, benign   Mixed hyperlipidemia   Chronic systolic heart failure (HCC)   CKD (chronic kidney disease) stage 3, GFR 30-59 ml/min   Ischemic cardiomyopathy   Thrombocytopenia, unspecified (HCC)   Atrial fibrillation (HCC)   Lung mass   Anemia   Elevated troponin   Cough    Time spent: 40 mins    [facility] MD [facility] Pager  [phone]. If 7PM-7AM, please contact night-coverage at [email or web address], password [place] 10/18/2015, 9:34 AM  LOS: 1 day

## 2015-10-18 NOTE — Progress Notes (Signed)
Referring Provider: Grandville Silos Primary Care Physician:  Mickie Hillier, MD   Reason for Consultation:  Melena; anemia  HPI: Evan Melendez 82 year old male from [place] with multiple comorbidities including heart failure and dementia admitted to the hospital with weakness and profound anemia-melena on digital rectal exam/Hemoccult positive. Presented with a hemoglobin of 6.5; improved to 9.3 after 2 units of packed red blood cells. He remains hemodynamically stable.  Also, presented with worsening cough; chest CT suggests  metastatic lung cancer. Evan Melendez has not had any hematemesis, odynophagia, dysphagia or abdominal pain.  Denies hematochezia.  Appetite reported as good until the last couple of days.  No history of peptic ulcer disease. Has a history of multiple colonic adenomas removed by me about 2 years ago. Prep was poor. He also had his hemorrhoids banded last year which was very helpful in controlling those symptoms.  Apparently, no prior EGD. No history of peptic ulcer disease.  Please note, the Evan Melendez takes aspirin daily and family states he has a bottle of Advil at home which he takes regularly for arthritis pain.  In addition, the family informed me that he has had chronic nosebleeds since childhood and sometimes is found with  blood on his pillow.  Dr. Luan Pulling has seen the Evan Melendez and recommends a palliative approach to his pulmonary issues. Hospice has been recommended.  Past Medical History  Diagnosis Date  . Essential hypertension, benign   . Anemia   . Gout   . ED (erectile dysfunction)   . Mixed hyperlipidemia   . Coronary atherosclerosis of native coronary artery     Multivessel status post CABG  . Impaired fasting glucose   . Insomnia   . CTS (carpal tunnel syndrome)   . Chronic systolic heart failure ([place])   . Myocardial infarction ([place]) 1985  . Ischemic cardiomyopathy     LVEF 30% with severe LVH  . DDD (degenerative disc disease), lumbar   . Hearing  difficulty   . Dementia   . Hemorrhoids     Past Surgical History  Procedure Laterality Date  . Appendectomy    . Prostatectomy    . Cholecystectomy    . Tonsillectomy    . Colonoscopy    . Coronary artery bypass graft  2004    Dr. Roxy Manns - LIMA to LAD, SVG to circumflex, SVG to RCA  . Hand surgery Bilateral     Dupuytren's contracture repair bilaterally, right carpal tunnel syndrome surgery  . Nasal sinus surgery    . Colonoscopy  2003    Dr. Lindalou Hose: adenomatous polyp  . Colonoscopy  2010    Dr. Lindalou Hose: tubular adenoma, hyperplastic polyp. Surveillance requested for 2013  . Inguinal hernia repair Right   . Colonoscopy N/A 10/14/2013    Dr. Tamia Dial:Multiple colonic polyps-removed as described above/Minimal internal hemorrhoids-cannot exclude small anal fissure. Tubular adenomas  . Hemorrhoid banding      Prior to Admission medications   Medication Sig Start Date End Date Taking? Authorizing Provider  acetaminophen (TYLENOL) 325 MG tablet Take 650 mg by mouth 2 (two) times daily.   Yes Historical Provider, MD  allopurinol (ZYLOPRIM) 100 MG tablet TAKE 1 TABLET BY MOUTH AT BEDTIME 03/18/15  Yes Mikey Kirschner, MD  Ascorbic Acid (VITAMIN C) 100 MG tablet Take 100 mg by mouth daily.   Yes Historical Provider, MD  aspirin 81 MG tablet Take 81 mg by mouth every morning.    Yes Historical Provider, MD  carvedilol (COREG) 6.25 MG tablet TAKE 1 TABLET  BY MOUTH TWICE DAILY 07/06/15  Yes Satira Sark, MD  donepezil (ARICEPT) 10 MG tablet Take 1 tablet (10 mg total) by mouth at bedtime. 03/09/15  Yes Ward Givens, NP  enalapril (VASOTEC) 10 MG tablet TAKE 1 TABLET BY MOUTH EVERY MORNING. 08/04/15  Yes Mikey Kirschner, MD  ferrous sulfate 325 (65 FE) MG tablet Take 325 mg by mouth 2 (two) times daily.    Yes Historical Provider, MD  furosemide (LASIX) 40 MG tablet TAKE 1 TABLET BY MOUTH DAILY. 05/06/15  Yes Mikey Kirschner, MD  hydrocortisone (PROCTOSOL HC) 2.5 % rectal cream APPLY  RECTALLYBID AS NEEDED FOR HEMORRHOIDS. 07/14/15  Yes Mikey Kirschner, MD  Lidocaine-Hydrocortisone Ace 2-2 % KIT INSERT RECTALLY TWICE DAILY Evan Melendez taking differently: Rectally as needed 11/04/14  Yes Mikey Kirschner, MD  potassium chloride SA (K-DUR,KLOR-CON) 20 MEQ tablet TAKE 2 TABLETS BY MOUTH EVERY MORNING Evan Melendez taking differently: TAKE 1 TABLET BY MOUTH 2 times daily 08/04/15  Yes Mikey Kirschner, MD  Pyridoxine HCl (VITAMIN B-6 PO) Take 1 tablet by mouth daily.    Yes Historical Provider, MD  simvastatin (ZOCOR) 40 MG tablet TAKE 1 TABLET BY MOUTH AT BEDTIME 03/18/15  Yes Mikey Kirschner, MD  tamsulosin (FLOMAX) 0.4 MG CAPS capsule TAKE 1 CAPSULE BY MOUTH DAILY AFTER SUPPER 08/04/15  Yes Mikey Kirschner, MD  fexofenadine (ALLEGRA) 180 MG tablet Take 1 tablet (180 mg total) by mouth daily. Evan Melendez taking differently: Take 180 mg by mouth as needed for allergies.  03/13/14   Mikey Kirschner, MD    Current Facility-Administered Medications  Medication Dose Route Frequency Provider Last Rate Last Dose  . acetaminophen (TYLENOL) tablet 650 mg  650 mg Oral Q6H PRN Vianne Bulls, MD       Or  . acetaminophen (TYLENOL) suppository 650 mg  650 mg Rectal Q6H PRN Vianne Bulls, MD      . allopurinol (ZYLOPRIM) tablet 100 mg  100 mg Oral QHS Vianne Bulls, MD   100 mg at 10/18/15 0110  . benzonatate (TESSALON) capsule 200 mg  200 mg Oral TID Eugenie Filler, MD   200 mg at 10/18/15 1131  . donepezil (ARICEPT) tablet 10 mg  10 mg Oral QHS Vianne Bulls, MD   10 mg at 10/18/15 0107  . ferrous sulfate tablet 325 mg  325 mg Oral BID Vianne Bulls, MD   325 mg at 10/18/15 5056  . HYDROcodone-acetaminophen (NORCO/VICODIN) 5-325 MG per tablet 1-2 tablet  1-2 tablet Oral Q4H PRN Vianne Bulls, MD      . ondansetron (ZOFRAN) tablet 4 mg  4 mg Oral Q6H PRN Vianne Bulls, MD       Or  . ondansetron (ZOFRAN) injection 4 mg  4 mg Intravenous Q6H PRN Vianne Bulls, MD      . pantoprazole  (PROTONIX) 80 mg in sodium chloride 0.9 % 250 mL (0.32 mg/mL) infusion  8 mg/hr Intravenous Continuous Eugenie Filler, MD 25 mL/hr at 10/18/15 0931 8 mg/hr at 10/18/15 0931  . [START ON 10/21/2015] pantoprazole (PROTONIX) injection 40 mg  40 mg Intravenous Q12H Irine Seal V, MD      . pyridOXINE (VITAMIN B-6) tablet 50 mg  50 mg Oral Daily Vianne Bulls, MD   50 mg at 10/18/15 0900  . simvastatin (ZOCOR) tablet 40 mg  40 mg Oral QHS Vianne Bulls, MD   40 mg at 10/18/15 0106  .  sodium chloride flush (NS) 0.9 % injection 3 mL  3 mL Intravenous Q12H Ilene Qua Opyd, MD   3 mL at 10/18/15 0900  . tamsulosin (FLOMAX) capsule 0.4 mg  0.4 mg Oral Daily Vianne Bulls, MD   0.4 mg at 10/18/15 4332  . vitamin C (ASCORBIC ACID) tablet 250 mg  250 mg Oral Daily Eugenie Filler, MD   250 mg at 10/18/15 9518    Allergies as of 10/17/2015 - Review Complete 10/17/2015  Allergen Reaction Noted  . Quinidine Other (See Comments) 11/01/2012    Family History  Problem Relation Age of Onset  . Dementia Father   . Stroke Father   . Rheum arthritis Mother   . Heart disease Mother   . Colon cancer Neg Hx     Social History   Social History  . Marital Status: Widowed    Spouse Name: N/A  . Number of Children: 2  . Years of Education: military   Occupational History  . Retired    Social History Main Topics  . Smoking status: Former Smoker -- 3.00 packs/day for 30 years    Types: Cigarettes    Start date: 06/04/1940    Quit date: 10/15/1983  . Smokeless tobacco: Never Used  . Alcohol Use: No  . Drug Use: No  . Sexual Activity: Yes    Birth Control/ Protection: None   Other Topics Concern  . Not on file   Social History Narrative   Evan Melendez lives at home alone.    Evan Melendez has 2 children.    Evan Melendez has a 11th grade education.    Evan Melendez is retired.    Evan Melendez is widowed.    Evan Melendez is right handed.    Caffeine None .     Review of Systems:  See below and in history of present  illness Gen: Denies any fever, chills, sweats,CV: Denies chest pain, angina, palpitations, syncope, orthopnea, PND, peripheral edema, and claudication. GI: Denies vomiting blood, jaundice, and fecal incontinence.   Denies dysphagia or odynophagia. Derm: Denies rash, itching, dry skin, hives, moles, warts, or unhealing ulcers.    Physical Exam: Vital signs in last 24 hours: Temp:  [97.3 F (36.3 C)-98.8 F (37.1 C)] 98.1 F (36.7 C) (03/26 0820) Pulse Rate:  [37-107] 91 (03/26 0900) Resp:  [0-31] 14 (03/26 0900) BP: (76-146)/(37-90) 126/88 mmHg (03/26 0900) SpO2:  [90 %-100 %] 95 % (03/26 0900) Weight:  [146 lb (66.225 kg)-146 lb 13.2 oz (66.6 kg)] 146 lb 13.2 oz (66.6 kg) (03/25 2325) Last BM Date: 10/17/15 General:   Awake Evan Melendez and conversant. Disheveled appearing He is at times disoriented. He is coming by his daughter and son. Appears in no acute distress. Head:  Normocephalic and atraumatic. Eyes:  Sclera clear, no icterus.   Conjunctiva pale. Lungs:  Decreased breath sounds bilaterally. No wheezes, crackles, or rhonchi. No acute distress. Heart:  Regular rate and rhythm; no murmurs, clicks, rubs,  or gallops. Abdomen:  Soft, nontender and nondistended. No masses, hepatosplenomegaly or hernias noted. Normal bowel sounds, without guarding, and without rebound.    Intake/Output from previous day: 03/25 0701 - 03/26 0700 In: 910 [I.V.:250; Blood:660] Out: -  Intake/Output this shift: Total I/O In: 240 [P.O.:240] Out: -   Lab Results:  Recent Labs  10/17/15 2050 10/18/15 0901  WBC 4.7  --   HGB 6.5* 9.3*  HCT 22.6* 30.5*  PLT 134*  --    BMET  Recent Labs  10/17/15 2050 10/18/15 0901  NA 138 138  K 4.9 5.1  CL 108 109  CO2 22 21*  GLUCOSE 115* 145*  BUN 44* 43*  CREATININE 1.71* 1.66*  CALCIUM 7.9* 8.0*   LFT  Recent Labs  10/18/15 0901  PROT 5.2*  ALBUMIN 2.8*  AST 81*  ALT 77*  ALKPHOS 111  BILITOT 1.3*   PT/INR  Recent Labs   10/17/15 2050  LABPROT 18.1*  INR 1.50*   Hepatitis Panel No results for input(s): HEPBSAG, HCVAB, HEPAIGM, HEPBIGM in the last 72 hours. C-Diff No results for input(s): CDIFFTOX in the last 72 hours.  Studies/Results: Dg Chest 2 View  10/17/2015  CLINICAL DATA:  Nonproductive cough for 2 days. EXAM: CHEST  2 VIEW COMPARISON:  03/29/2013 FINDINGS: 7.7 cm diameter masslike opacity in the left mid lung. This could represent rounded pneumonia but appearance is highly suspicious for neoplasm. Emphysematous changes are suggested in the lungs. Heart size and pulmonary vascularity are normal. No blunting of costophrenic angles. No pneumothorax. Calcified and tortuous aorta. Postoperative changes in the mediastinum. IMPRESSION: 7.7 cm diameter mass in the left mid lung highly suspicious for neoplasm. Rounded pneumonia less likely. Electronically Signed   By: Lucienne Capers M.D.   On: 10/17/2015 19:16   Ct Chest Wo Contrast  10/17/2015  CLINICAL DATA:  Cough, abnormal chest radiograph EXAM: CT CHEST WITHOUT CONTRAST TECHNIQUE: Multidetector CT imaging of the chest was performed following the standard protocol without IV contrast. COMPARISON:  Chest radiograph dated 10/17/2015 FINDINGS: Mediastinum/Nodes: Cardiomegaly. No pericardial effusion. Hypodense blood pool relative to myocardium, suggesting anemia. Three vessel coronary atherosclerosis. Postsurgical changes related to prior CABG. Extensive atherosclerotic calcifications the abdominal aorta and branch vessels. Mediastinal lymphadenopathy, including: --14 mm short axis low right paratracheal node (series 2/ image 24) --11 mm short axis left paratracheal/ AP window node (series 2/ image 25) --10 mm short axis subcarinal node (series 2/image 29) Visualized thyroid is unremarkable. Lungs/Pleura: 6.9 x 7.3 x 7.8 cm posterior left upper lobe mass, abutting the lateral aspect of the left chest wall, and abutting the left fissure and likely transgressing the  left lower lobe (sagittal image 87). 11 mm satellite nodule in the superior segment left lower lobe (series 4/ image 26), suspicious for metastasis. Underlying mild centrilobular emphysematous changes. Small left and trace right pleural effusions.  No pneumothorax. Upper abdomen: Visualized upper abdomen is notable for vascular calcifications and small volume perihepatic ascites. Musculoskeletal: Degenerative changes of the visualized thoracolumbar spine. Median sternotomy. IMPRESSION: 7.8 cm posterior left upper lobe mass, as described above. Mass abuts the left chest wall and left fissure, possibly involving the left lower lobe. 11 mm satellite nodule in the superior segment left lower lobe, suspicious for metastasis. Associated mediastinal lymphadenopathy. Small left pleural effusion and trace right pleural effusion. Electronically Signed   By: Julian Hy M.D.   On: 10/17/2015 20:19     Impression:  Evan Melendez demented 82 year old male from [facility] admitted to the hospital with profound anemia and likely melena recently. He has responded nicely to transfusion. His remain hemodynamically stable. Unfortunately, Evan Melendez appears to have metastatic lung cancer. He has significant dementia and multiple comorbidities.  Given his daily aspirin and frequent Advil use, I suspect he has suffered NSAID injury to either his stomach or small bowel. He certainly could have peptic ulcer disease. He less likely has a lesion in his colon since it was only 2 years ago when he had a colonoscopy and multiple colon polyps removed.    The preparation was  poor at that time.  Long-standing chronic intermittent epistaxis could also be clouding the clinical picture  After lengthy discussion with Evan Melendez, Evan Melendez's daughter and son, the consensus is no invasive studies at this time including EGD unless absolutely necessary.  Mildly elevated LFTs-nonspecific.   Recommendations:  Continue Protonix can probably transition  empirically to 40 mg orally twice a day in the next 24 hours if he remains stable.  Follow H&H.  Absolutely avoid all aspirin/NSAID products moving forward.  We will reassess him tomorrow.   If he displays ongoing evidence of bleeding, we will have further discussion regarding performing an EGD.  No further evaluation of  mildly elevated LFTs at this time.  I briefly discussed case with Dr. Grandville Silos earlier today.  Notice:  This dictation was prepared with Dragon dictation along with smaller phrase technology. Any transcriptional errors that result from this process are unintentional and may not be corrected upon review.

## 2015-10-18 NOTE — Consult Note (Signed)
Consult requested by: [facility] Consult requested for abnormal chest CT:  HPI: This is an 82 year old who came in with malaise and fatigue and cough. When he came to the emergency department he was found to have a hemoglobin of 6.5 and had melena. He was positive for blood. He does take aspirin daily. No obvious bleeding has been noted. As part of his workup he had a chest x-ray and then head CT. CT shows a large mass. This is highly suggestive of primary lung cancer. He has had some shortness of breath. He has chronic systolic heart failure. He stopped smoking about 30 years ago. He has dementia and according to his family he has very poor quality of life generally mostly in bed.  Past Medical History  Diagnosis Date  . Essential hypertension, benign   . Anemia   . Gout   . ED (erectile dysfunction)   . Mixed hyperlipidemia   . Coronary atherosclerosis of native coronary artery     Multivessel status post CABG  . Impaired fasting glucose   . Insomnia   . CTS (carpal tunnel syndrome)   . Chronic systolic heart failure ([place])   . Myocardial infarction ([place]) 1985  . Ischemic cardiomyopathy     LVEF 30% with severe LVH  . DDD (degenerative disc disease), lumbar   . Hearing difficulty   . Dementia   . Hemorrhoids      Family History  Problem Relation Age of Onset  . Dementia Father   . Stroke Father   . Rheum arthritis Mother   . Heart disease Mother   . Colon cancer Neg Hx      Social History   Social History  . Marital Status: Widowed    Spouse Name: N/A  . Number of Children: 2  . Years of Education: military   Occupational History  . Retired    Social History Main Topics  . Smoking status: Former Smoker -- 3.00 packs/day for 30 years    Types: Cigarettes    Start date: 06/04/1940    Quit date: 10/15/1983  . Smokeless tobacco: Never Used  . Alcohol Use: No  . Drug Use: No  . Sexual Activity: Yes    Birth Control/ Protection: None   Other Topics  Concern  . None   Social History Narrative   Patient lives at home alone.    Patient has 2 children.    Patient has a 11th grade education.    Patient is retired.    Patient is widowed.    Patient is right handed.    Caffeine None .      ROS: No hemoptysis. No definite significant weight loss until the last week. He does have a cough. No swelling. Otherwise per the history and physical    Objective: Vital signs in last 24 hours: Temp:  [97.3 F (36.3 C)-98.8 F (37.1 C)] 98.1 F (36.7 C) (03/26 0820) Pulse Rate:  [37-107] 91 (03/26 0900) Resp:  [0-31] 14 (03/26 0900) BP: (76-146)/(37-90) 126/88 mmHg (03/26 0900) SpO2:  [90 %-100 %] 95 % (03/26 0900) Weight:  [66.225 kg (146 lb)-66.6 kg (146 lb 13.2 oz)] 66.6 kg (146 lb 13.2 oz) (03/25 2325) Weight change:  Last BM Date: 10/17/15  Intake/Output from previous day: 03/25 0701 - 03/26 0700 In: 910 [I.V.:250; Blood:660] Out: -   PHYSICAL EXAM He is awake and alert. He is confused. He looks pretty comfortable. His chest shows some rhonchi. His heart is regular without gallop. His  abdomen is soft without masses he has no edema his HEENT is unremarkable. His neck is supple without masses.  Lab Results: Basic Metabolic Panel:  Recent Labs  84/75/82 2050 10/18/15 0901  NA 138 138  K 4.9 5.1  CL 108 109  CO2 22 21*  GLUCOSE 115* 145*  BUN 44* 43*  CREATININE 1.71* 1.66*  CALCIUM 7.9* 8.0*   Liver Function Tests:  Recent Labs  10/18/15 0901  AST 81*  ALT 77*  ALKPHOS 111  BILITOT 1.3*  PROT 5.2*  ALBUMIN 2.8*   No results for input(s): LIPASE, AMYLASE in the last 72 hours. No results for input(s): AMMONIA in the last 72 hours. CBC:  Recent Labs  10/17/15 2050 10/18/15 0901  WBC 4.7  --   NEUTROABS 3.7  --   HGB 6.5* 9.3*  HCT 22.6* 30.5*  MCV 79.6  --   PLT 134*  --    Cardiac Enzymes:  Recent Labs  10/17/15 2050 10/18/15 0901  TROPONINI 0.13*  0.13* 0.08*   BNP: No results for input(s):  PROBNP in the last 72 hours. D-Dimer: No results for input(s): DDIMER in the last 72 hours. CBG:  Recent Labs  10/18/15 0803  GLUCAP 101*   Hemoglobin A1C: No results for input(s): HGBA1C in the last 72 hours. Fasting Lipid Panel: No results for input(s): CHOL, HDL, LDLCALC, TRIG, CHOLHDL, LDLDIRECT in the last 72 hours. Thyroid Function Tests: No results for input(s): TSH, T4TOTAL, FREET4, T3FREE, THYROIDAB in the last 72 hours. Anemia Panel:  Recent Labs  10/17/15 2050  RETICCTPCT 2.2   Coagulation:  Recent Labs  10/17/15 2050  LABPROT 18.1*  INR 1.50*   Urine Drug Screen: Drugs of Abuse  No results found for: LABOPIA, COCAINSCRNUR, LABBENZ, AMPHETMU, THCU, LABBARB  Alcohol Level: No results for input(s): ETH in the last 72 hours. Urinalysis:  Recent Labs  10/18/15 0300  COLORURINE YELLOW  LABSPEC 1.015  PHURINE 5.0  GLUCOSEU NEGATIVE  HGBUR MODERATE*  BILIRUBINUR NEGATIVE  KETONESUR NEGATIVE  PROTEINUR 30*  NITRITE NEGATIVE  LEUKOCYTESUR NEGATIVE   Misc. Labs:   ABGS: No results for input(s): PHART, PO2ART, TCO2, HCO3 in the last 72 hours.  Invalid input(s): PCO2   MICROBIOLOGY: Recent Results (from the past 240 hour(s))  Culture, blood (Routine X 2) w Reflex to ID Panel     Status: None (Preliminary result)   Collection Time: 10/17/15  8:50 PM  Result Value Ref Range Status   Specimen Description BLOOD RIGHT ARM  Final   Special Requests BOTTLES DRAWN AEROBIC AND ANAEROBIC 6CC  Final   Culture PENDING  Incomplete   Report Status PENDING  Incomplete  Culture, blood (Routine X 2) w Reflex to ID Panel     Status: None (Preliminary result)   Collection Time: 10/17/15  8:57 PM  Result Value Ref Range Status   Specimen Description RIGHT ANTECUBITAL  Final   Special Requests BOTTLES DRAWN AEROBIC AND ANAEROBIC [facility] EACH  Final   Culture PENDING  Incomplete   Report Status PENDING  Incomplete  MRSA PCR Screening     Status: None   Collection  Time: 10/17/15 11:30 PM  Result Value Ref Range Status   MRSA by PCR NEGATIVE NEGATIVE Final    Comment:        The GeneXpert MRSA Assay (FDA approved for NASAL specimens only), is one component of a comprehensive MRSA colonization surveillance program. It is not intended to diagnose MRSA infection nor to guide or monitor treatment  for MRSA infections.     Studies/Results: Dg Chest 2 View  10/17/2015  CLINICAL DATA:  Nonproductive cough for 2 days. EXAM: CHEST  2 VIEW COMPARISON:  03/29/2013 FINDINGS: 7.7 cm diameter masslike opacity in the left mid lung. This could represent rounded pneumonia but appearance is highly suspicious for neoplasm. Emphysematous changes are suggested in the lungs. Heart size and pulmonary vascularity are normal. No blunting of costophrenic angles. No pneumothorax. Calcified and tortuous aorta. Postoperative changes in the mediastinum. IMPRESSION: 7.7 cm diameter mass in the left mid lung highly suspicious for neoplasm. Rounded pneumonia less likely. Electronically Signed   By: Lucienne Capers M.D.   On: 10/17/2015 19:16   Ct Chest Wo Contrast  10/17/2015  CLINICAL DATA:  Cough, abnormal chest radiograph EXAM: CT CHEST WITHOUT CONTRAST TECHNIQUE: Multidetector CT imaging of the chest was performed following the standard protocol without IV contrast. COMPARISON:  Chest radiograph dated 10/17/2015 FINDINGS: Mediastinum/Nodes: Cardiomegaly. No pericardial effusion. Hypodense blood pool relative to myocardium, suggesting anemia. Three vessel coronary atherosclerosis. Postsurgical changes related to prior CABG. Extensive atherosclerotic calcifications the abdominal aorta and branch vessels. Mediastinal lymphadenopathy, including: --14 mm short axis low right paratracheal node (series 2/ image 24) --11 mm short axis left paratracheal/ AP window node (series 2/ image 25) --10 mm short axis subcarinal node (series 2/image 29) Visualized thyroid is unremarkable. Lungs/Pleura:  6.9 x 7.3 x 7.8 cm posterior left upper lobe mass, abutting the lateral aspect of the left chest wall, and abutting the left fissure and likely transgressing the left lower lobe (sagittal image 87). 11 mm satellite nodule in the superior segment left lower lobe (series 4/ image 26), suspicious for metastasis. Underlying mild centrilobular emphysematous changes. Small left and trace right pleural effusions.  No pneumothorax. Upper abdomen: Visualized upper abdomen is notable for vascular calcifications and small volume perihepatic ascites. Musculoskeletal: Degenerative changes of the visualized thoracolumbar spine. Median sternotomy. IMPRESSION: 7.8 cm posterior left upper lobe mass, as described above. Mass abuts the left chest wall and left fissure, possibly involving the left lower lobe. 11 mm satellite nodule in the superior segment left lower lobe, suspicious for metastasis. Associated mediastinal lymphadenopathy. Small left pleural effusion and trace right pleural effusion. Electronically Signed   By: Julian Hy M.D.   On: 10/17/2015 20:19    Medications:  Prior to Admission:  Prescriptions prior to admission  Medication Sig Dispense Refill Last Dose  . acetaminophen (TYLENOL) 325 MG tablet Take 650 mg by mouth 2 (two) times daily.   10/17/2015  . allopurinol (ZYLOPRIM) 100 MG tablet TAKE 1 TABLET BY MOUTH AT BEDTIME 30 tablet 5 10/17/2015  . Ascorbic Acid (VITAMIN C) 100 MG tablet Take 100 mg by mouth daily.   10/16/2015  . aspirin 81 MG tablet Take 81 mg by mouth every morning.    10/16/2015  . carvedilol (COREG) 6.25 MG tablet TAKE 1 TABLET BY MOUTH TWICE DAILY 180 tablet 1 10/17/2015 at 0600  . donepezil (ARICEPT) 10 MG tablet Take 1 tablet (10 mg total) by mouth at bedtime. 30 tablet 6 10/17/2015  . enalapril (VASOTEC) 10 MG tablet TAKE 1 TABLET BY MOUTH EVERY MORNING. 30 tablet 5 10/16/2015  . ferrous sulfate 325 (65 FE) MG tablet Take 325 mg by mouth 2 (two) times daily.    10/17/2015  .  furosemide (LASIX) 40 MG tablet TAKE 1 TABLET BY MOUTH DAILY. 30 tablet 5 10/16/2015  . hydrocortisone (PROCTOSOL HC) 2.5 % rectal cream APPLY RECTALLYBID AS NEEDED FOR HEMORRHOIDS.  29.328 g PRN Past Month  . Lidocaine-Hydrocortisone Ace 2-2 % KIT INSERT RECTALLY TWICE DAILY (Patient taking differently: Rectally as needed) 24 each 5 Past Month  . potassium chloride SA (K-DUR,KLOR-CON) 20 MEQ tablet TAKE 2 TABLETS BY MOUTH EVERY MORNING (Patient taking differently: TAKE 1 TABLET BY MOUTH 2 times daily) 60 tablet 5 10/17/2015  . Pyridoxine HCl (VITAMIN B-6 PO) Take 1 tablet by mouth daily.    10/16/2015  . simvastatin (ZOCOR) 40 MG tablet TAKE 1 TABLET BY MOUTH AT BEDTIME 30 tablet 5 10/17/2015  . tamsulosin (FLOMAX) 0.4 MG CAPS capsule TAKE 1 CAPSULE BY MOUTH DAILY AFTER SUPPER 30 capsule 5 10/17/2015  . fexofenadine (ALLEGRA) 180 MG tablet Take 1 tablet (180 mg total) by mouth daily. (Patient taking differently: Take 180 mg by mouth as needed for allergies. ) 30 tablet 5 More Than A Month   Scheduled: . allopurinol  100 mg Oral QHS  . benzonatate  200 mg Oral TID  . donepezil  10 mg Oral QHS  . ferrous sulfate  325 mg Oral BID  . [START ON 10/21/2015] pantoprazole (PROTONIX) IV  40 mg Intravenous Q12H  . vitamin B-6  50 mg Oral Daily  . simvastatin  40 mg Oral QHS  . sodium chloride flush  3 mL Intravenous Q12H  . tamsulosin  0.4 mg Oral Daily  . vitamin C  250 mg Oral Daily   Continuous: . pantoprozole (PROTONIX) infusion 8 mg/hr (10/18/15 0931)   NAT:FTDDUKGURKYHC **OR** acetaminophen, HYDROcodone-acetaminophen, ondansetron **OR** ondansetron (ZOFRAN) IV  Assesment: He was admitted with symptomatic anemia and that's being worked up and treated.  I was consulted for lung mass. Discussed all this with his family and they would like a palliative approach. They feel that his quality of life is not good enough now to put him through the rigors of chemotherapy and/or radiation. Discussed hospice  care with them and they would be interested in discussion of hospice when he is ready for discharge. I do think his cough is probably related to his likely primary malignancy and I agree with Tessalon.  He has multiple other medical problems as listed below. Apparently one of the most severe problems that he has symptoms from now is his severe degenerative disc disease in his back  He has dementia which is at least moderately severe Principal Problem:   Symptomatic anemia Active Problems:   Essential hypertension, benign   Mixed hyperlipidemia   Chronic systolic heart failure (HCC)   CKD (chronic kidney disease) stage 3, GFR 30-59 ml/min   Ischemic cardiomyopathy   Thrombocytopenia, unspecified (HCC)   Atrial fibrillation (H[facility])   GI bleed   Lung mass   Anemia   Elevated troponin   Acute blood loss anemia   Cough   Arterial hypotension   Upper GI bleeding    Plan: Continue treatments.    LOS: 1 day   Latana Colin L 10/18/2015, 11:02 AM

## 2015-10-18 NOTE — Progress Notes (Signed)
*  PRELIMINARY RESULTS* Echocardiogram 2D Echocardiogram has been performed.  Leavy Cella 10/18/2015, 1:15 PM

## 2015-10-19 ENCOUNTER — Other Ambulatory Visit: Payer: Self-pay | Admitting: *Deleted

## 2015-10-19 DIAGNOSIS — Z515 Encounter for palliative care: Secondary | ICD-10-CM

## 2015-10-19 DIAGNOSIS — J101 Influenza due to other identified influenza virus with other respiratory manifestations: Secondary | ICD-10-CM

## 2015-10-19 DIAGNOSIS — Z7189 Other specified counseling: Secondary | ICD-10-CM

## 2015-10-19 LAB — BASIC METABOLIC PANEL
ANION GAP: 9 (ref 5–15)
BUN: 42 mg/dL — ABNORMAL HIGH (ref 6–20)
CO2: 19 mmol/L — AB (ref 22–32)
Calcium: 8.1 mg/dL — ABNORMAL LOW (ref 8.9–10.3)
Chloride: 110 mmol/L (ref 101–111)
Creatinine, Ser: 1.53 mg/dL — ABNORMAL HIGH (ref 0.61–1.24)
GFR calc Af Amer: 45 mL/min — ABNORMAL LOW (ref >60)
GFR calc non Af Amer: 39 mL/min — ABNORMAL LOW (ref >60)
GLUCOSE: 105 mg/dL — AB (ref 65–99)
Potassium: 4.5 mmol/L (ref 3.5–5.1)
Sodium: 138 mmol/L (ref 135–145)

## 2015-10-19 LAB — URINE CULTURE: CULTURE: NO GROWTH

## 2015-10-19 LAB — CBC
HEMATOCRIT: 30.4 % — AB (ref 39.0–52.0)
HEMOGLOBIN: 9.2 g/dL — AB (ref 13.0–17.0)
MCH: 24.1 pg — AB (ref 26.0–34.0)
MCHC: 30.3 g/dL (ref 30.0–36.0)
MCV: 79.8 fL (ref 78.0–100.0)
Platelets: 122 10*3/uL — ABNORMAL LOW (ref 150–400)
RBC: 3.81 MIL/uL — ABNORMAL LOW (ref 4.22–5.81)
RDW: 17.5 % — ABNORMAL HIGH (ref 11.5–15.5)
WBC: 5.4 10*3/uL (ref 4.0–10.5)

## 2015-10-19 LAB — GLUCOSE, CAPILLARY: Glucose-Capillary: 103 mg/dL — ABNORMAL HIGH (ref 65–99)

## 2015-10-19 MED ORDER — CARVEDILOL 3.125 MG PO TABS
3.1250 mg | ORAL_TABLET | Freq: Two times a day (BID) | ORAL | Status: DC
  Administered 2015-10-19 – 2015-10-21 (×6): 3.125 mg via ORAL
  Filled 2015-10-19 (×7): qty 1

## 2015-10-19 MED ORDER — CARVEDILOL 3.125 MG PO TABS: 6.2500 mg | ORAL_TABLET | Freq: Two times a day (BID) | ORAL | Status: DC

## 2015-10-19 MED ORDER — FLUTICASONE PROPIONATE 50 MCG/ACT NA SUSP
2.0000 | Freq: Every day | NASAL | Status: DC
  Administered 2015-10-19 – 2015-10-22 (×3): 2 via NASAL
  Filled 2015-10-19 (×3): qty 16

## 2015-10-19 MED ORDER — OSELTAMIVIR PHOSPHATE 30 MG PO CAPS
30.0000 mg | ORAL_CAPSULE | Freq: Two times a day (BID) | ORAL | Status: DC
  Administered 2015-10-19 – 2015-10-22 (×6): 30 mg via ORAL
  Filled 2015-10-19 (×8): qty 1

## 2015-10-19 MED ORDER — PANTOPRAZOLE SODIUM 40 MG IV SOLR
40.0000 mg | Freq: Two times a day (BID) | INTRAVENOUS | Status: DC
  Administered 2015-10-19 – 2015-10-21 (×6): 40 mg via INTRAVENOUS
  Filled 2015-10-19 (×7): qty 40

## 2015-10-19 MED ORDER — STERILE WATER FOR INJECTION IV SOLN
INTRAVENOUS | Status: DC
  Administered 2015-10-19 – 2015-10-21 (×4): via INTRAVENOUS
  Filled 2015-10-19 (×7): qty 850

## 2015-10-19 MED ORDER — LORATADINE 10 MG PO TABS
10.0000 mg | ORAL_TABLET | Freq: Every day | ORAL | Status: DC
  Administered 2015-10-19 – 2015-10-22 (×4): 10 mg via ORAL
  Filled 2015-10-19 (×4): qty 1

## 2015-10-19 NOTE — Consult Note (Signed)
Consultation Note Date: 10/19/2015   Patient Name: Evan Melendez  DOB: 06-Dec-1925  MRN: [id number]  Age / Sex: 82 y.o., male  PCP: Mikey Kirschner, MD Referring Physician: Eugenie Filler, MD  Reason for Consultation: Disposition, Establishing goals of care and Psychosocial/spiritual support   Clinical Assessment/Narrative: [name] is resting quietly in bed with his daughter [name] at bedside. We talk about his current health concerns including his life at home. [name] shares that for the last 3 years she is been coming to [name] is home from her home in [place] once per week to help prepare medications and food and take him to his doctors appointments. She shares that he has a caregiver that is paid for by the family ([facility]). [name] is "wonderful" and comes 5 days a week for 5 hours. 8 AM to 11 AM and also 3 to 5 PM.    [name] shares that their preference is to keep [name] regardless of other services available. We discussed home with home health versus hospice. We also discussed rehab. [name] shares a story about leaving her father at the daycare [facility] and that he was agitated by lunchtime, asking why they had left him at the nursing home.  She shares that that their goal is to never have him live in the skilled nursing facility.  I share the chronic illness trajectory, including the trajectory with dementia. I also share the benefits of Hospice and Home health services.  [name] asks for more information about both [place] and Hospice providers in the area. [name] asks if she is "right thinking", and I share that there is nothing we can do to change his cancer, and that she has the right to say what this time looks like for her father.   Contacts/Participants in Discussion: daughter [name] Primary Decision Maker: daughter [name], second is son Tonnie Friedel   Relationship to  Patient adult children HCPOA: yes  daughter [name] thin son Akhilesh Sassone.  SUMMARY OF RECOMMENDATIONS  Code Status/Advance Care Planning: DNR    Code Status Orders        Start     Ordered   10/17/15 2219  Full code   Continuous     10/17/15 2222    Code Status History    Date Active Date Inactive Code Status Order ID Comments User Context   03/29/2013 11:26 PM 03/30/2013  6:16 PM Full Code [id number]  Karlyn Agee, MD Inpatient   11/11/2012 11:47 AM 11/14/2012  5:21 PM Full Code [id number]  Doree Albee, MD ED      Other Directives:None  Symptom Management:   per hospitalist.  Palliative Prophylaxis:   Aspiration, Oral Care and Turn Reposition  Additional Recommendations (Limitations, Scope, Preferences):  Treat the treatable at this point, no extraordinary measures.  Psycho-social/Spiritual:  Support System: Strong Desire for further Chaplaincy support:no Additional Recommendations: Caregiving  Support/Resources and Education on Hospice  Prognosis: Unable to determine, likely 6 months or less based on new diagnosis of cancer and families desire to not seek treatment.  Discharge Planning: Home with home health versus hospice.   Chief Complaint/ Primary Diagnoses: Present on Admission:  . Atrial fibrillation ([place]) . Chronic systolic heart failure ([place]) . CKD (chronic kidney disease) stage 3, GFR 30-59 ml/min . Essential hypertension, benign . Thrombocytopenia, unspecified ([place]) . Ischemic cardiomyopathy . GI bleed . Symptomatic anemia . Lung mass . Anemia . Elevated troponin . Acute blood loss anemia . Mixed hyperlipidemia .  Cough . Influenza A  I have reviewed the medical record, interviewed the patient and family, and examined the patient. The following aspects are pertinent.  Past Medical History  Diagnosis Date  . Essential hypertension, benign   . Anemia   . Gout   . ED (erectile dysfunction)   . Mixed hyperlipidemia   .  Coronary atherosclerosis of native coronary artery     Multivessel status post CABG  . Impaired fasting glucose   . Insomnia   . CTS (carpal tunnel syndrome)   . Chronic systolic heart failure ([place])   . Myocardial infarction ([place]) 1985  . Ischemic cardiomyopathy     LVEF 30% with severe LVH  . DDD (degenerative disc disease), lumbar   . Hearing difficulty   . Dementia   . Hemorrhoids    Social History   Social History  . Marital Status: Widowed    Spouse Name: N/A  . Number of Children: 2  . Years of Education: military   Occupational History  . Retired    Social History Main Topics  . Smoking status: Former Smoker -- 3.00 packs/day for 30 years    Types: Cigarettes    Start date: 06/04/1940    Quit date: 10/15/1983  . Smokeless tobacco: Never Used  . Alcohol Use: No  . Drug Use: No  . Sexual Activity: Yes    Birth Control/ Protection: None   Other Topics Concern  . None   Social History Narrative   Patient lives at home alone.    Patient has 2 children.    Patient has a 11th grade education.    Patient is retired.    Patient is widowed.    Patient is right handed.    Caffeine None .    Family History  Problem Relation Age of Onset  . Dementia Father   . Stroke Father   . Rheum arthritis Mother   . Heart disease Mother   . Colon cancer Neg Hx    Scheduled Meds: . allopurinol  100 mg Oral QHS  . benzonatate  200 mg Oral TID  . carvedilol  3.125 mg Oral BID  . donepezil  10 mg Oral QHS  . ferrous sulfate  325 mg Oral BID  . fluticasone  2 spray Each Nare Daily  . loratadine  10 mg Oral Daily  . oseltamivir  30 mg Oral BID  . pantoprazole (PROTONIX) IV  40 mg Intravenous Q12H  . vitamin B-6  50 mg Oral Daily  . simvastatin  40 mg Oral QHS  . sodium chloride flush  3 mL Intravenous Q12H  . tamsulosin  0.4 mg Oral Daily  . vitamin C  250 mg Oral Daily   Continuous Infusions: .  sodium bicarbonate 150 mEq in sterile water 1000 mL infusion 75 mL/hr at  10/19/15 1012   PRN Meds:.acetaminophen **OR** acetaminophen, HYDROcodone-acetaminophen, ondansetron **OR** ondansetron (ZOFRAN) IV, zolpidem Medications Prior to Admission:  Prior to Admission medications   Medication Sig Start Date End Date Taking? Authorizing Provider  acetaminophen (TYLENOL) 325 MG tablet Take 650 mg by mouth 2 (two) times daily.   Yes Historical Provider, MD  allopurinol (ZYLOPRIM) 100 MG tablet TAKE 1 TABLET BY MOUTH AT BEDTIME 03/18/15  Yes Mikey Kirschner, MD  Ascorbic Acid (VITAMIN C) 100 MG tablet Take 100 mg by mouth daily.   Yes Historical Provider, MD  aspirin 81 MG tablet Take 81 mg by mouth every morning.    Yes Historical Provider, MD  carvedilol (COREG) 6.25 MG tablet TAKE 1 TABLET BY MOUTH TWICE DAILY 07/06/15  Yes Satira Sark, MD  donepezil (ARICEPT) 10 MG tablet Take 1 tablet (10 mg total) by mouth at bedtime. 03/09/15  Yes Ward Givens, NP  enalapril (VASOTEC) 10 MG tablet TAKE 1 TABLET BY MOUTH EVERY MORNING. 08/04/15  Yes Mikey Kirschner, MD  ferrous sulfate 325 (65 FE) MG tablet Take 325 mg by mouth 2 (two) times daily.    Yes Historical Provider, MD  furosemide (LASIX) 40 MG tablet TAKE 1 TABLET BY MOUTH DAILY. 05/06/15  Yes Mikey Kirschner, MD  hydrocortisone (PROCTOSOL HC) 2.5 % rectal cream APPLY RECTALLYBID AS NEEDED FOR HEMORRHOIDS. 07/14/15  Yes Mikey Kirschner, MD  Lidocaine-Hydrocortisone Ace 2-2 % KIT INSERT RECTALLY TWICE DAILY Patient taking differently: Rectally as needed 11/04/14  Yes Mikey Kirschner, MD  potassium chloride SA (K-DUR,KLOR-CON) 20 MEQ tablet TAKE 2 TABLETS BY MOUTH EVERY MORNING Patient taking differently: TAKE 1 TABLET BY MOUTH 2 times daily 08/04/15  Yes Mikey Kirschner, MD  Pyridoxine HCl (VITAMIN B-6 PO) Take 1 tablet by mouth daily.    Yes Historical Provider, MD  simvastatin (ZOCOR) 40 MG tablet TAKE 1 TABLET BY MOUTH AT BEDTIME 03/18/15  Yes Mikey Kirschner, MD  tamsulosin (FLOMAX) 0.4 MG CAPS capsule TAKE 1  CAPSULE BY MOUTH DAILY AFTER SUPPER 08/04/15  Yes Mikey Kirschner, MD  fexofenadine (ALLEGRA) 180 MG tablet Take 1 tablet (180 mg total) by mouth daily. Patient taking differently: Take 180 mg by mouth as needed for allergies.  03/13/14   Mikey Kirschner, MD   Allergies  Allergen Reactions  . Quinidine Other (See Comments)    Chest Pain    Review of Systems  Unable to perform ROS: Dementia    Physical Exam  Nursing note and vitals reviewed. Constitutional: No distress.  HENT:  Head: Normocephalic and atraumatic.  Cardiovascular: Normal rate.   Respiratory: Effort normal. No respiratory distress.  GI: Soft. He exhibits no distension. There is no guarding.  Neurological: He is alert.  Pleasantly confused  Skin: Skin is warm and dry.    Vital Signs: BP 97/63 mmHg  Pulse 92  Temp(Src) 97.9 F (36.6 C) (Oral)  Resp 27  Ht '5\' 7"'$  (1.702 m)  Wt 66.8 kg (147 lb 4.3 oz)  BMI 23.06 kg/m2  SpO2 94%  SpO2: SpO2: 94 % O2 Device:SpO2: 94 % O2 Flow Rate: .   IO: Intake/output summary:  Intake/Output Summary (Last 24 hours) at 10/19/15 1647 Last data filed at 10/19/15 1501  Gross per 24 hour  Intake    723 ml  Output    850 ml  Net   -127 ml    LBM: Last BM Date: 10/17/15 Baseline Weight: Weight: 66.225 kg (146 lb) Most recent weight: Weight: 66.8 kg (147 lb 4.3 oz)      Palliative Assessment/Data:  Flowsheet Rows        Most Recent Value   Intake Tab    Referral Department  Pulmonary   Unit at Time of Referral  ICU   Palliative Care Primary Diagnosis  Pulmonary   Date Notified  10/19/15   Palliative Care Type  New Palliative care   Reason for referral  Clarify Goals of Care   Date of Admission  10/17/15   Date first seen by Palliative Care  10/19/15   # of days Palliative referral response time  0 Day(s)   # of days IP prior  to Palliative referral  2   Clinical Assessment    Palliative Performance Scale Score  40%   Pain Max last 24 hours  Not able to report    Pain Min Last 24 hours  Not able to report   Dyspnea Max Last 24 Hours  Not able to report   Dyspnea Min Last 24 hours  Not able to report   Psychosocial & Spiritual Assessment    Palliative Care Outcomes    Patient/Family meeting held?  Yes   Who was at the meeting?  Daughter [name]   Palliative Care Outcomes  Clarified goals of care, Provided end of life care assistance, Provided advance care planning, Provided psychosocial or spiritual support, Changed CPR status, Completed durable DNR   Patient/Family wishes: Interventions discontinued/not started   Mechanical Ventilation   Palliative Care follow-up planned  -- [Follow-up at [facility]]      Additional Data Reviewed:  CBC:    Component Value Date/Time   WBC 5.4 10/19/2015 0509   HGB 9.2* 10/19/2015 0509   HCT 30.4* 10/19/2015 0509   PLT 122* 10/19/2015 0509   MCV 79.8 10/19/2015 0509   NEUTROABS 3.7 10/17/2015 2050   LYMPHSABS 0.6* 10/17/2015 2050   MONOABS 0.4 10/17/2015 2050   EOSABS 0.0 10/17/2015 2050   BASOSABS 0.0 10/17/2015 2050   Comprehensive Metabolic Panel:    Component Value Date/Time   NA 138 10/19/2015 0509   NA 143 09/14/2015 1423   K 4.5 10/19/2015 0509   CL 110 10/19/2015 0509   CO2 19* 10/19/2015 0509   BUN 42* 10/19/2015 0509   BUN 34* 09/14/2015 1423   CREATININE 1.53* 10/19/2015 0509   CREATININE 1.52* 09/10/2014 0818   GLUCOSE 105* 10/19/2015 0509   GLUCOSE 86 09/14/2015 1423   CALCIUM 8.1* 10/19/2015 0509   AST 81* 10/18/2015 0901   ALT 77* 10/18/2015 0901   ALKPHOS 111 10/18/2015 0901   BILITOT 1.3* 10/18/2015 0901   BILITOT 0.3 03/18/2015 0808   PROT 5.2* 10/18/2015 0901   PROT 5.6* 03/18/2015 0808   ALBUMIN 2.8* 10/18/2015 0901   ALBUMIN 3.7 03/18/2015 0808     Time In: 1500 Time Out: 1610 Time Total: 70 minutes Greater than 50%  of this time was spent counseling and coordinating care related to the above assessment and plan.  Signed by: Drue Novel, NP  Drue Novel,  NP  10/19/2015, 4:47 PM  Please contact Palliative Medicine Team phone at [phone] for questions and concerns.

## 2015-10-19 NOTE — Progress Notes (Signed)
Subjective: No overt GI bleeding. Family member at bedside. Patient without any GI complaints.   Objective: Vital signs in last 24 hours: Temp:  [97.5 F (36.4 C)-98.4 F (36.9 C)] 97.7 F (36.5 C) (03/27 0400) Pulse Rate:  [66-114] 114 (03/27 0000) Resp:  [14-30] 17 (03/27 0700) BP: (87-132)/(55-88) 100/62 mmHg (03/27 0700) SpO2:  [90 %-96 %] 91 % (03/27 0000) Weight:  [147 lb 4.3 oz (66.8 kg)] 147 lb 4.3 oz (66.8 kg) (03/27 0500) Last BM Date: 10/17/15 General:   Alert and oriented to person. Pleasant.  Head:  Normocephalic and atraumatic. Abdomen:  Bowel sounds present, soft, non-tender, non-distended. Extremities:  Without edema. Neurologic:  Alert and  oriented to person only Psych:  Alert and cooperative.   Intake/Output from previous day: 03/26 0701 - 03/27 0700 In: 603 [P.O.:600; I.V.:3] Out: 1000 [Urine:1000] Intake/Output this shift:    Lab Results:  Recent Labs  10/17/15 2050 10/18/15 0901 10/18/15 1540 10/19/15 0509  WBC 4.7  --   --  5.4  HGB 6.5* 9.3* 9.5* 9.2*  HCT 22.6* 30.5* 31.5* 30.4*  PLT 134*  --   --  122*   BMET  Recent Labs  10/17/15 2050 10/18/15 0901 10/19/15 0509  NA 138 138 138  K 4.9 5.1 4.5  CL 108 109 110  CO2 22 21* 19*  GLUCOSE 115* 145* 105*  BUN 44* 43* 42*  CREATININE 1.71* 1.66* 1.53*  CALCIUM 7.9* 8.0* 8.1*   LFT  Recent Labs  10/18/15 0901  PROT 5.2*  ALBUMIN 2.8*  AST 81*  ALT 77*  ALKPHOS 111  BILITOT 1.3*   PT/INR  Recent Labs  10/17/15 2050  LABPROT 18.1*  INR 1.50*     Studies/Results: Dg Chest 2 View  10/17/2015  CLINICAL DATA:  Nonproductive cough for 2 days. EXAM: CHEST  2 VIEW COMPARISON:  03/29/2013 FINDINGS: 7.7 cm diameter masslike opacity in the left mid lung. This could represent rounded pneumonia but appearance is highly suspicious for neoplasm. Emphysematous changes are suggested in the lungs. Heart size and pulmonary vascularity are normal. No blunting of costophrenic  angles. No pneumothorax. Calcified and tortuous aorta. Postoperative changes in the mediastinum. IMPRESSION: 7.7 cm diameter mass in the left mid lung highly suspicious for neoplasm. Rounded pneumonia less likely. Electronically Signed   By: Lucienne Capers M.D.   On: 10/17/2015 19:16   Ct Chest Wo Contrast  10/17/2015  CLINICAL DATA:  Cough, abnormal chest radiograph EXAM: CT CHEST WITHOUT CONTRAST TECHNIQUE: Multidetector CT imaging of the chest was performed following the standard protocol without IV contrast. COMPARISON:  Chest radiograph dated 10/17/2015 FINDINGS: Mediastinum/Nodes: Cardiomegaly. No pericardial effusion. Hypodense blood pool relative to myocardium, suggesting anemia. Three vessel coronary atherosclerosis. Postsurgical changes related to prior CABG. Extensive atherosclerotic calcifications the abdominal aorta and branch vessels. Mediastinal lymphadenopathy, including: --14 mm short axis low right paratracheal node (series 2/ image 24) --11 mm short axis left paratracheal/ AP window node (series 2/ image 25) --10 mm short axis subcarinal node (series 2/image 29) Visualized thyroid is unremarkable. Lungs/Pleura: 6.9 x 7.3 x 7.8 cm posterior left upper lobe mass, abutting the lateral aspect of the left chest wall, and abutting the left fissure and likely transgressing the left lower lobe (sagittal image 87). 11 mm satellite nodule in the superior segment left lower lobe (series 4/ image 26), suspicious for metastasis. Underlying mild centrilobular emphysematous changes. Small left and trace right pleural effusions.  No pneumothorax. Upper abdomen: Visualized upper abdomen is  notable for vascular calcifications and small volume perihepatic ascites. Musculoskeletal: Degenerative changes of the visualized thoracolumbar spine. Median sternotomy. IMPRESSION: 7.8 cm posterior left upper lobe mass, as described above. Mass abuts the left chest wall and left fissure, possibly involving the left lower  lobe. 11 mm satellite nodule in the superior segment left lower lobe, suspicious for metastasis. Associated mediastinal lymphadenopathy. Small left pleural effusion and trace right pleural effusion. Electronically Signed   By: Julian Hy M.D.   On: 10/17/2015 20:19    Assessment: Pleasant 82 year old male with dementia who was admitted with profound anemia and melena, without any further overt GI bleeding currently. Hgb stable overall. Received 2 units PRBCs this admission. Appears he likely has metastatic lung cancer and palliative care consultation has been ordered. Likely suffered NSAID-induced injury to upper GI tract, less likely lower GI tract etiology. As he has had no further overt GI bleeding, endoscopic evaluation on hold.     Plan: Will discontinue Protonix infusion and transition to 40 mg BID  Will follow peripherally Agree with palliative care consultation No endoscopic evaluation at this time unless overt GI bleeding   Orvil Feil, ANP-BC [facility]    LOS: 2 days    10/19/2015, 7:39 AM

## 2015-10-19 NOTE — Progress Notes (Signed)
[facility] PROGRESS NOTE  CARTEL MAUSS GLO:[id number] DOB: Oct 19, 1925 DOA: 10/17/2015 PCP: Mickie Hillier, MD  Assessment/Plan: #1 symptomatic anemia/probable GI bleed Patient had presented with generalized malaise and fatigue and a cough. Patient noted admission to have a hemoglobin of 6.5. Digital rectal exam done noted melanotic stool. FOBT was positive. Patient denies any NSAID use however is on aspirin daily. Patient denies any overt melanotic bleeding. Patient was given Protonix 80 mg IV push 1.  Patient is on a Protonix drip which has been transitioned to oral Protonix. GI. Patient status post 2 units packed red blood cells with hemoglobin currently at 9.2.  GI following and  recommended no endoscopic evaluation at this time unless overt GI bleed. GI ff.  #2 acute blood loss anemia Secondary to problem #1. Status post transfusion of 2 units packed red blood cells. H&H stable.  #3 elevated troponin Likely secondary to demand ischemia secondary to problem #1. Elevated troponins seem to have plateaued. Patient denies any chest pain. EKG with A. fib with borderline T-wave abnormalities.  2-D echo with a EF of 20-25% with diffuse hypokinesis worse in the inferior and inferoseptal walls. Septal motion showed abnormal function dyssynergy.  Hypokinesis with inferior akinesis seen on prior 2-D echo of 2014 with a EF of 30%.  We'll resume patient's beta blocker.  Continue to hold ACE inhibitor secondary to borderline hypotension.  Medical management for now as patient with a lung mass likely metastatic in nature and family wanting conservative measures as well as patient and as such palliative care consultation pending.  #4 cough/ influenza A Questionable etiology. Chest x-ray and CT chest negative for pneumonia however consistent with a lung mass which might be the etiology of his cough.  Influenza PCR positive for influenza A. Patient has been started on Tamiflu. Continue Tessalon Perles.   Will add Claritin  and Flonase as patient is  complaining of congestion. Hycodan as needed.  #5 atrial fibrillation CHADS2VASC = 4 Heart rate currently controlled. Beta blocker on hold secondary to hypotension on admission. Unable to place on anticoagulation at this time due to problem #1.  #6 chronic thrombocytopenia Stable. Monitor.  #7 chronic kidney disease stage III Stable.  #8 hypotension Likely secondary to problem #1. Improved with IV fluids and transfusion. Antihypertensive medications on hold.  IV fluids.Follow.  #9 chronic systolic heart failure 2-D echo from 09/14/2012 with a EF of 30% with severe LVH. Patient was initially hypotensive on admission and as such diuretics and beta blocker and ACE inhibitor on hold.  Resume Coreg at half home dose at 3.125 mg twice a day.  #10 lung mass Concern for cancerous lesion and probable metastases.  Patient has been seen in consultation by pulmonary and family is leaning towards conservative measures. Palliative care consultation has been placed for further evaluation and management. Pulmonary following and appreciate input and recommendations.  #11 Hypertension Patient noted to be hypotensive on admission and as such antihypertensive medications have been held. Follow.  #12 prophylaxis PPI for GI prophylaxis. SCDs for DVT prophylaxis.   Code Status: Full Family Communication: Updated patient and daughter at bedside. Disposition Plan: Home once medically stable and GI bleed has been evaluated as well as lung mass and once hemoglobin has stabilized and no further bleeding. Palliative care consultation pending.   Consultants:  Gastroenterology: Dr Gala Romney 10/18/2015  Pulmonary Hawkins 10/18/2015  Procedures:  2 units packed red blood cells 10/18/2015.  Chest x-ray 10/17/2015  CT chest 10/17/2015    2-D echo  10/18/2015  Antibiotics:  None  HPI/Subjective: Patient states feeling better. No melanotic stools. No CP. No SOB.  Patient c/o congestion  Objective: Filed Vitals:   10/19/15 0700 10/19/15 0800  BP: 100/62   Pulse:    Temp:  97.8 F (36.6 C)  Resp: 17     Intake/Output Summary (Last 24 hours) at 10/19/15 0909 Last data filed at 10/19/15 0844  Gross per 24 hour  Intake    603 ml  Output   1000 ml  Net   -397 ml   Filed Weights   10/17/15 1845 10/17/15 2325 10/19/15 0500  Weight: 66.225 kg (146 lb) 66.6 kg (146 lb 13.2 oz) 66.8 kg (147 lb 4.3 oz)    Exam:   General:  NAD  Cardiovascular: Irregularly irregular  Respiratory: Some scattered coarse BS. No wheezing. No crackles.  Abdomen: Soft, nontender, nondistended, positive bowel sounds.  Musculoskeletal: No clubbing cyanosis or edema.  Data Reviewed: Basic Metabolic Panel:  Recent Labs Lab 10/17/15 2050 10/18/15 0901 10/19/15 0509  NA 138 138 138  K 4.9 5.1 4.5  CL 108 109 110  CO2 22 21* 19*  GLUCOSE 115* 145* 105*  BUN 44* 43* 42*  CREATININE 1.71* 1.66* 1.53*  CALCIUM 7.9* 8.0* 8.1*   Liver Function Tests:  Recent Labs Lab 10/18/15 0901  AST 81*  ALT 77*  ALKPHOS 111  BILITOT 1.3*  PROT 5.2*  ALBUMIN 2.8*   No results for input(s): LIPASE, AMYLASE in the last 168 hours. No results for input(s): AMMONIA in the last 168 hours. CBC:  Recent Labs Lab 10/17/15 2050 10/18/15 0901 10/18/15 1540 10/19/15 0509  WBC 4.7  --   --  5.4  NEUTROABS 3.7  --   --   --   HGB 6.5* 9.3* 9.5* 9.2*  HCT 22.6* 30.5* 31.5* 30.4*  MCV 79.6  --   --  79.8  PLT 134*  --   --  122*   Cardiac Enzymes:  Recent Labs Lab 10/17/15 2050 10/18/15 0901 10/18/15 1540  TROPONINI 0.13*  0.13* 0.08* 0.09*   BNP (last 3 results) No results for input(s): BNP in the last 8760 hours.  ProBNP (last 3 results) No results for input(s): PROBNP in the last 8760 hours.  CBG:  Recent Labs Lab 10/18/15 0803 10/19/15 0802  GLUCAP 101* 103*    Recent Results (from the past 240 hour(s))  Culture, blood (Routine X 2) w  Reflex to ID Panel     Status: None (Preliminary result)   Collection Time: 10/17/15  8:50 PM  Result Value Ref Range Status   Specimen Description BLOOD RIGHT ARM  Final   Special Requests BOTTLES DRAWN AEROBIC AND ANAEROBIC 6CC  Final   Culture PENDING  Incomplete   Report Status PENDING  Incomplete  Culture, blood (Routine X 2) w Reflex to ID Panel     Status: None (Preliminary result)   Collection Time: 10/17/15  8:57 PM  Result Value Ref Range Status   Specimen Description RIGHT ANTECUBITAL  Final   Special Requests BOTTLES DRAWN AEROBIC AND ANAEROBIC [facility] EACH  Final   Culture PENDING  Incomplete   Report Status PENDING  Incomplete  MRSA PCR Screening     Status: None   Collection Time: 10/17/15 11:30 PM  Result Value Ref Range Status   MRSA by PCR NEGATIVE NEGATIVE Final    Comment:        The GeneXpert MRSA Assay (FDA approved for NASAL specimens only), is one  component of a comprehensive MRSA colonization surveillance program. It is not intended to diagnose MRSA infection nor to guide or monitor treatment for MRSA infections.      Studies: Dg Chest 2 View  10/17/2015  CLINICAL DATA:  Nonproductive cough for 2 days. EXAM: CHEST  2 VIEW COMPARISON:  03/29/2013 FINDINGS: 7.7 cm diameter masslike opacity in the left mid lung. This could represent rounded pneumonia but appearance is highly suspicious for neoplasm. Emphysematous changes are suggested in the lungs. Heart size and pulmonary vascularity are normal. No blunting of costophrenic angles. No pneumothorax. Calcified and tortuous aorta. Postoperative changes in the mediastinum. IMPRESSION: 7.7 cm diameter mass in the left mid lung highly suspicious for neoplasm. Rounded pneumonia less likely. Electronically Signed   By: Lucienne Capers M.D.   On: 10/17/2015 19:16   Ct Chest Wo Contrast  10/17/2015  CLINICAL DATA:  Cough, abnormal chest radiograph EXAM: CT CHEST WITHOUT CONTRAST TECHNIQUE: Multidetector CT imaging of the  chest was performed following the standard protocol without IV contrast. COMPARISON:  Chest radiograph dated 10/17/2015 FINDINGS: Mediastinum/Nodes: Cardiomegaly. No pericardial effusion. Hypodense blood pool relative to myocardium, suggesting anemia. Three vessel coronary atherosclerosis. Postsurgical changes related to prior CABG. Extensive atherosclerotic calcifications the abdominal aorta and branch vessels. Mediastinal lymphadenopathy, including: --14 mm short axis low right paratracheal node (series 2/ image 24) --11 mm short axis left paratracheal/ AP window node (series 2/ image 25) --10 mm short axis subcarinal node (series 2/image 29) Visualized thyroid is unremarkable. Lungs/Pleura: 6.9 x 7.3 x 7.8 cm posterior left upper lobe mass, abutting the lateral aspect of the left chest wall, and abutting the left fissure and likely transgressing the left lower lobe (sagittal image 87). 11 mm satellite nodule in the superior segment left lower lobe (series 4/ image 26), suspicious for metastasis. Underlying mild centrilobular emphysematous changes. Small left and trace right pleural effusions.  No pneumothorax. Upper abdomen: Visualized upper abdomen is notable for vascular calcifications and small volume perihepatic ascites. Musculoskeletal: Degenerative changes of the visualized thoracolumbar spine. Median sternotomy. IMPRESSION: 7.8 cm posterior left upper lobe mass, as described above. Mass abuts the left chest wall and left fissure, possibly involving the left lower lobe. 11 mm satellite nodule in the superior segment left lower lobe, suspicious for metastasis. Associated mediastinal lymphadenopathy. Small left pleural effusion and trace right pleural effusion. Electronically Signed   By: Julian Hy M.D.   On: 10/17/2015 20:19    Scheduled Meds: . allopurinol  100 mg Oral QHS  . benzonatate  200 mg Oral TID  . carvedilol  3.125 mg Oral BID  . donepezil  10 mg Oral QHS  . ferrous sulfate  325 mg  Oral BID  . oseltamivir  75 mg Oral BID  . pantoprazole (PROTONIX) IV  40 mg Intravenous Q12H  . vitamin B-6  50 mg Oral Daily  . simvastatin  40 mg Oral QHS  . sodium chloride flush  3 mL Intravenous Q12H  . tamsulosin  0.4 mg Oral Daily  . vitamin C  250 mg Oral Daily   Continuous Infusions: .  sodium bicarbonate 150 mEq in sterile water 1000 mL infusion      Principal Problem:   Symptomatic anemia Active Problems:   GI bleed   Acute blood loss anemia   Influenza A   Essential hypertension, benign   Mixed hyperlipidemia   Chronic systolic heart failure (HCC)   CKD (chronic kidney disease) stage 3, GFR 30-59 ml/min   Ischemic cardiomyopathy  Thrombocytopenia, unspecified (HCC)   Atrial fibrillation (HCC)   Lung mass   Anemia   Elevated troponin   Cough   Arterial hypotension   Upper GI bleeding   Melena    Time spent: 40 mins    [facility] MD [facility] Pager [phone]. If 7PM-7AM, please contact night-coverage at [email or web address], password [facility] 10/19/2015, 9:09 AM  LOS: 2 days

## 2015-10-19 NOTE — Patient Outreach (Addendum)
[name]  10/19/2015  Evan Melendez 02-03-1926 [id number]  [name] is an 82 year old gentleman who has been followed in the community off and on for 3 years by [facility]. [name] was admitted to the hospital on Saturday 3/25 when he presented with malaise/fatigue and cough. He was found to have anemia with Hg 6.5, heme+ stool, flu, and an incidental finding of lung mass.   Evan Melendez lives alone in the family home in [place]. He has history of HTN, Hyperlipdemia, CAD, CHF, CKD, and Dementia. Evan Melendez adult children Evan Melendez [phone]) and Evan Melendez [phone]) have joint HCPOA. Evan Melendez serves as primary caregiver. Both adult children live out of town but visit regularly and are very involved in Evan Melendez' care.   Most recently, Evan Melendez has had a decline in memory and self care ability. [facility] Management assisted with arrangements for in home care assistance through [employer]. He has a caregiver who comes for a few hours in the morning and again for a few hours in the afternoon M-F. This extra care and socialization has made a tremendous difference in Evan Melendez' overall condition and cognition.   I reached out to Evan Melendez' daughter Evan Melendez who is at his bedside today. She told me she met with the Hospice/Palliative Care team and they had a brief goals of care discussion. Evan Melendez said that Evan Melendez stated he did not want to have chemotherapy or radiation or surgery. Evan Melendez indicated that she anticipates a continued stay in the hospital for her father for a few more days while his anemia, dehydration, and flu symptoms are treated.   [facility] Management will remain involved in Evan Melendez' care upon discharge from the hospital. We are happy to assist with progression and discharge planning and will follow closely. [facility] Liaison [facility] RN, BSN, CCM is following and  be contacted at [phone]. I can be reached at [phone].    [facility]  [phone]

## 2015-10-19 NOTE — Evaluation (Signed)
Physical Therapy Evaluation Patient Details Name: Evan Melendez MRN: [id number] DOB: 1925-10-10 Today's Date: 10/19/2015   History of Present Illness  Evan Melendez is a 82 y.o. male with PMH of dementia, hypertension, chronic systolic CHF, chronic atrial fibrillation, and remote tobacco abuse presents to the ED with 2 days of nonproductive cough, malaise, and fatigue. Patient lives alone but has a caretaker visits during the day. He had been noted to be much less active the past 2 days and upon questioning, he endorsed a nonproductive cough, malaise, and fatigue. He denied any pain, fevers, nausea, vomiting, or diarrhea. He denied any dysuria. Up until 2 days ago, patient had been in his usual state of health and fairly active throughout the day. Patient denies any recent fall or trauma. He admits to using his medications inconsistently  Clinical Impression  Pt is an 82 yo male who normally lives alone and ambulates with no assistive device.  He states that his illness came on suddenly.  He now has significant intolerance to activity fatiguing with only 3 sit to stands.  Evan Melendez will benefit from skilled PT and from SNF placement to improve his safety with prolong activity.     Follow Up Recommendations SNF    Equipment Recommendations   none at this time    Recommendations for Other Services   OT    Precautions / Restrictions Precautions Precautions: None Restrictions Weight Bearing Restrictions: No      Mobility  Bed Mobility Overal bed mobility: Needs Assistance Bed Mobility: Sit to Supine;Supine to Sit     Supine to sit: Min guard Sit to supine: Min assist      Transfers Overall transfer level: Needs assistance Equipment used: None Transfers: Sit to/from Stand Sit to Stand: Min assist         General transfer comment: Pt unable to stand greater than 5 seconds prior to wanting to sit down   Ambulation/Gait    Patient was to fatigue to complete this  task.                     Pertinent Vitals/Pain Pain Assessment: No/denies pain    Home Living Family/patient expects to be discharged to:: Skilled nursing facility Living Arrangements: Alone                    Prior Function Level of Independence: Independent                  Extremity/Trunk Assessment               Lower Extremity Assessment: Generalized weakness         Communication   Communication: No difficulties  Cognition Arousal/Alertness: Awake/alert   Overall Cognitive Status: Within Functional Limits for tasks assessed                         Exercises General Exercises - Lower Extremity Ankle Circles/Pumps: Both;10 reps Long Arc Quad: Both;5 reps Straight Leg Raises: Both;5 reps      Assessment/Plan    PT Assessment Patient needs continued PT services  PT Diagnosis Difficulty walking;Generalized weakness   PT Problem List Decreased strength;Decreased activity tolerance;Decreased balance;Decreased mobility;Decreased knowledge of use of DME  PT Treatment Interventions Gait training;Therapeutic exercise;Functional mobility training;Therapeutic activities   PT Goals (Current goals can be found in the Care Plan section) Acute Rehab PT Goals PT Goal Formulation: With patient Time For Goal Achievement: 10/22/15  Potential to Achieve Goals: Good    Frequency Min 3X/week           End of Session   Activity Tolerance: Patient limited by fatigue Patient left: in bed;with call bell/phone within reach           Time: 1150-1210 PT Time Calculation (min) (ACUTE ONLY): 20 min   Charges:   PT Evaluation $PT Eval Low Complexity: 1 Procedure     PT G CodesRayetta Humphrey, PT CLT [phone] 10/19/2015, 12:10 PM

## 2015-10-19 NOTE — Progress Notes (Signed)
Subjective: He says he feels okay. He has no new complaints. He says he is breathing okay. He denies pain in his chest. He asked what was wrong so I discussed it with him with the understanding that he likely will forget soon but when I discussed this with him he says he does not want any aggressive treatments.  Objective: Vital signs in last 24 hours: Temp:  [97.5 F (36.4 C)-98.4 F (36.9 C)] 97.7 F (36.5 C) (03/27 0400) Pulse Rate:  [66-114] 114 (03/27 0000) Resp:  [14-30] 17 (03/27 0700) BP: (87-132)/(55-88) 100/62 mmHg (03/27 0700) SpO2:  [90 %-96 %] 91 % (03/27 0000) Weight:  [66.8 kg (147 lb 4.3 oz)] 66.8 kg (147 lb 4.3 oz) (03/27 0500) Weight change: 0.575 kg (1 lb 4.3 oz) Last BM Date: 10/17/15  Intake/Output from previous day: 03/26 0701 - 03/27 0700 In: 603 [P.O.:600; I.V.:3] Out: 1000 [Urine:1000]  PHYSICAL EXAM General appearance: alert, cooperative and no distress Resp: clear to auscultation bilaterally Cardio: irregularly irregular rhythm GI: soft, non-tender; bowel sounds normal; no masses,  no organomegaly Extremities: extremities normal, atraumatic, no cyanosis or edema  Lab Results:  Results for orders placed or performed during the hospital encounter of 10/17/15 (from the past 48 hour(s))  CBC with Differential/Platelet     Status: Abnormal   Collection Time: 10/17/15  8:50 PM  Result Value Ref Range   WBC 4.7 4.0 - 10.5 K/uL   RBC 2.84 (L) 4.22 - 5.81 MIL/uL   Hemoglobin 6.5 (LL) 13.0 - 17.0 g/dL    Comment: RESULT REPEATED AND VERIFIED CRITICAL RESULT CALLED TO, READ BACK BY AND VERIFIED WITH:  YARBOUGH,S @ 2129 ON 10/17/15 BY WOODIE,J    HCT 22.6 (L) 39.0 - 52.0 %   MCV 79.6 78.0 - 100.0 fL   MCH 22.9 (L) 26.0 - 34.0 pg   MCHC 28.8 (L) 30.0 - 36.0 g/dL   RDW 18.4 (H) 11.5 - 15.5 %   Platelets 134 (L) 150 - 400 K/uL   Neutrophils Relative % 80 %   Neutro Abs 3.7 1.7 - 7.7 K/uL   Lymphocytes Relative 12 %   Lymphs Abs 0.6 (L) 0.7 - 4.0 K/uL    Monocytes Relative 8 %   Monocytes Absolute 0.4 0.1 - 1.0 K/uL   Eosinophils Relative 0 %   Eosinophils Absolute 0.0 0.0 - 0.7 K/uL   Basophils Relative 0 %   Basophils Absolute 0.0 0.0 - 0.1 K/uL  Basic metabolic panel     Status: Abnormal   Collection Time: 10/17/15  8:50 PM  Result Value Ref Range   Sodium 138 135 - 145 mmol/L   Potassium 4.9 3.5 - 5.1 mmol/L   Chloride 108 101 - 111 mmol/L   CO2 22 22 - 32 mmol/L   Glucose, Bld 115 (H) 65 - 99 mg/dL   BUN 44 (H) 6 - 20 mg/dL   Creatinine, Ser 1.71 (H) 0.61 - 1.24 mg/dL   Calcium 7.9 (L) 8.9 - 10.3 mg/dL   GFR calc non Af Amer 34 (L) >60 mL/min   GFR calc Af Amer 40 (L) >60 mL/min    Comment: (NOTE) The eGFR has been calculated using the CKD EPI equation. This calculation has not been validated in all clinical situations. eGFR's persistently <60 mL/min signify possible Chronic Kidney Disease.    Anion gap 8 5 - 15  Culture, blood (Routine X 2) w Reflex to ID Panel     Status: None (Preliminary result)   Collection  Time: 10/17/15  8:50 PM  Result Value Ref Range   Specimen Description BLOOD RIGHT ARM    Special Requests BOTTLES DRAWN AEROBIC AND ANAEROBIC 6CC    Culture PENDING    Report Status PENDING   Troponin I     Status: Abnormal   Collection Time: 10/17/15  8:50 PM  Result Value Ref Range   Troponin I 0.13 (H) <0.031 ng/mL    Comment:        PERSISTENTLY INCREASED TROPONIN VALUES IN THE RANGE OF 0.04-0.49 ng/mL CAN BE SEEN IN:       -UNSTABLE ANGINA       -CONGESTIVE HEART FAILURE       -MYOCARDITIS       -CHEST TRAUMA       -ARRYHTHMIAS       -LATE PRESENTING MYOCARDIAL INFARCTION       -COPD   CLINICAL FOLLOW-UP RECOMMENDED.   Protime-INR     Status: Abnormal   Collection Time: 10/17/15  8:50 PM  Result Value Ref Range   Prothrombin Time 18.1 (H) 11.6 - 15.2 seconds   INR 1.50 (H) 0.00 - 1.49  APTT     Status: None   Collection Time: 10/17/15  8:50 PM  Result Value Ref Range   aPTT 37 24 - 37  seconds    Comment:        IF BASELINE aPTT IS ELEVATED, SUGGEST PATIENT RISK ASSESSMENT BE USED TO DETERMINE APPROPRIATE ANTICOAGULANT THERAPY.   Reticulocytes     Status: Abnormal   Collection Time: 10/17/15  8:50 PM  Result Value Ref Range   Retic Ct Pct 2.2 0.4 - 3.1 %   RBC. 2.83 (L) 4.22 - 5.81 MIL/uL   Retic Count, Manual 62.3 19.0 - 186.0 K/uL  Troponin I     Status: Abnormal   Collection Time: 10/17/15  8:50 PM  Result Value Ref Range   Troponin I 0.13 (H) <0.031 ng/mL    Comment:        PERSISTENTLY INCREASED TROPONIN VALUES IN THE RANGE OF 0.04-0.49 ng/mL CAN BE SEEN IN:       -UNSTABLE ANGINA       -CONGESTIVE HEART FAILURE       -MYOCARDITIS       -CHEST TRAUMA       -ARRYHTHMIAS       -LATE PRESENTING MYOCARDIAL INFARCTION       -COPD   CLINICAL FOLLOW-UP RECOMMENDED.   Culture, blood (Routine X 2) w Reflex to ID Panel     Status: None (Preliminary result)   Collection Time: 10/17/15  8:57 PM  Result Value Ref Range   Specimen Description RIGHT ANTECUBITAL    Special Requests BOTTLES DRAWN AEROBIC AND ANAEROBIC 6CC EACH    Culture PENDING    Report Status PENDING   I-Stat CG4 Lactic Acid, ED     Status: None   Collection Time: 10/17/15  9:09 PM  Result Value Ref Range   Lactic Acid, Venous 1.72 0.5 - 2.0 mmol/L  POC occult blood, ED Provider will collect     Status: Abnormal   Collection Time: 10/17/15  9:39 PM  Result Value Ref Range   Fecal Occult Bld POSITIVE (A) NEGATIVE  Prepare RBC     Status: None   Collection Time: 10/17/15  9:51 PM  Result Value Ref Range   Order Confirmation ORDER PROCESSED BY BLOOD BANK   Type and screen     Status: None (Preliminary result)   Collection  Time: 10/17/15  9:51 PM  Result Value Ref Range   ABO/RH(D) B POS    Antibody Screen NEG    Sample Expiration 10/20/2015    Unit Number [id number]    Blood Component Type RED CELLS,LR    Unit division 00    Status of Unit ISSUED,FINAL    Transfusion Status OK TO  TRANSFUSE    Crossmatch Result Compatible    Unit Number [id number]    Blood Component Type RBC LR PHER1    Unit division 00    Status of Unit ISSUED,FINAL    Transfusion Status OK TO TRANSFUSE    Crossmatch Result Compatible    Unit Number [id number]    Blood Component Type RED CELLS,LR    Unit division 00    Status of Unit ALLOCATED    Transfusion Status OK TO TRANSFUSE    Crossmatch Result Compatible   ABO/Rh     Status: None   Collection Time: 10/17/15  9:51 PM  Result Value Ref Range   ABO/RH(D) B POS   MRSA PCR Screening     Status: None   Collection Time: 10/17/15 11:30 PM  Result Value Ref Range   MRSA by PCR NEGATIVE NEGATIVE    Comment:        The GeneXpert MRSA Assay (FDA approved for NASAL specimens only), is one component of a comprehensive MRSA colonization surveillance program. It is not intended to diagnose MRSA infection nor to guide or monitor treatment for MRSA infections.   Urinalysis, Routine w reflex microscopic (not at [facility])     Status: Abnormal   Collection Time: 10/18/15  3:00 AM  Result Value Ref Range   Color, Urine YELLOW YELLOW   APPearance CLEAR CLEAR   Specific Gravity, Urine 1.015 1.005 - 1.030   pH 5.0 5.0 - 8.0   Glucose, UA NEGATIVE NEGATIVE mg/dL   Hgb urine dipstick MODERATE (A) NEGATIVE   Bilirubin Urine NEGATIVE NEGATIVE   Ketones, ur NEGATIVE NEGATIVE mg/dL   Protein, ur 30 (A) NEGATIVE mg/dL   Nitrite NEGATIVE NEGATIVE   Leukocytes, UA NEGATIVE NEGATIVE  Urine microscopic-add on     Status: Abnormal   Collection Time: 10/18/15  3:00 AM  Result Value Ref Range   Squamous Epithelial / LPF 0-5 (A) NONE SEEN   WBC, UA 0-5 0 - 5 WBC/hpf   RBC / HPF 6-30 0 - 5 RBC/hpf   Bacteria, UA MANY (A) NONE SEEN  Glucose, capillary     Status: Abnormal   Collection Time: 10/18/15  8:03 AM  Result Value Ref Range   Glucose-Capillary 101 (H) 65 - 99 mg/dL   Comment 1 Notify RN    Comment 2 Document in Chart   Comprehensive  metabolic panel     Status: Abnormal   Collection Time: 10/18/15  9:01 AM  Result Value Ref Range   Sodium 138 135 - 145 mmol/L   Potassium 5.1 3.5 - 5.1 mmol/L   Chloride 109 101 - 111 mmol/L   CO2 21 (L) 22 - 32 mmol/L   Glucose, Bld 145 (H) 65 - 99 mg/dL   BUN 43 (H) 6 - 20 mg/dL   Creatinine, Ser 1.66 (H) 0.61 - 1.24 mg/dL   Calcium 8.0 (L) 8.9 - 10.3 mg/dL   Total Protein 5.2 (L) 6.5 - 8.1 g/dL   Albumin 2.8 (L) 3.5 - 5.0 g/dL   AST 81 (H) 15 - 41 U/L   ALT 77 (H) 17 - 63 U/L  Alkaline Phosphatase 111 38 - 126 U/L   Total Bilirubin 1.3 (H) 0.3 - 1.2 mg/dL   GFR calc non Af Amer 35 (L) >60 mL/min   GFR calc Af Amer 41 (L) >60 mL/min    Comment: (NOTE) The eGFR has been calculated using the CKD EPI equation. This calculation has not been validated in all clinical situations. eGFR's persistently <60 mL/min signify possible Chronic Kidney Disease.    Anion gap 8 5 - 15  Troponin I     Status: Abnormal   Collection Time: 10/18/15  9:01 AM  Result Value Ref Range   Troponin I 0.08 (H) <0.031 ng/mL    Comment:        PERSISTENTLY INCREASED TROPONIN VALUES IN THE RANGE OF 0.04-0.49 ng/mL CAN BE SEEN IN:       -UNSTABLE ANGINA       -CONGESTIVE HEART FAILURE       -MYOCARDITIS       -CHEST TRAUMA       -ARRYHTHMIAS       -LATE PRESENTING MYOCARDIAL INFARCTION       -COPD   CLINICAL FOLLOW-UP RECOMMENDED.   Hemoglobin and hematocrit, blood     Status: Abnormal   Collection Time: 10/18/15  9:01 AM  Result Value Ref Range   Hemoglobin 9.3 (L) 13.0 - 17.0 g/dL    Comment: DELTA CHECK NOTED POST TRANSFUSION SPECIMEN    HCT 30.5 (L) 39.0 - 52.0 %  Influenza panel by PCR (type A & B, H1N1)     Status: Abnormal   Collection Time: 10/18/15 12:15 PM  Result Value Ref Range   Influenza A By PCR POSITIVE (A) NEGATIVE   Influenza B By PCR NEGATIVE NEGATIVE   H1N1 flu by pcr NOT DETECTED NOT DETECTED    Comment:        The Xpert Flu assay (FDA approved for nasal aspirates or  washes and nasopharyngeal swab specimens), is intended as an aid in the diagnosis of influenza and should not be used as a sole basis for treatment.   Troponin I     Status: Abnormal   Collection Time: 10/18/15  3:40 PM  Result Value Ref Range   Troponin I 0.09 (H) <0.031 ng/mL    Comment:        PERSISTENTLY INCREASED TROPONIN VALUES IN THE RANGE OF 0.04-0.49 ng/mL CAN BE SEEN IN:       -UNSTABLE ANGINA       -CONGESTIVE HEART FAILURE       -MYOCARDITIS       -CHEST TRAUMA       -ARRYHTHMIAS       -LATE PRESENTING MYOCARDIAL INFARCTION       -COPD   CLINICAL FOLLOW-UP RECOMMENDED.   Hemoglobin and hematocrit, blood     Status: Abnormal   Collection Time: 10/18/15  3:40 PM  Result Value Ref Range   Hemoglobin 9.5 (L) 13.0 - 17.0 g/dL   HCT 31.5 (L) 39.0 - 63.8 %  Basic metabolic panel     Status: Abnormal   Collection Time: 10/19/15  5:09 AM  Result Value Ref Range   Sodium 138 135 - 145 mmol/L   Potassium 4.5 3.5 - 5.1 mmol/L   Chloride 110 101 - 111 mmol/L   CO2 19 (L) 22 - 32 mmol/L   Glucose, Bld 105 (H) 65 - 99 mg/dL   BUN 42 (H) 6 - 20 mg/dL   Creatinine, Ser 1.53 (H) 0.61 - 1.24 mg/dL  Calcium 8.1 (L) 8.9 - 10.3 mg/dL   GFR calc non Af Amer 39 (L) >60 mL/min   GFR calc Af Amer 45 (L) >60 mL/min    Comment: (NOTE) The eGFR has been calculated using the CKD EPI equation. This calculation has not been validated in all clinical situations. eGFR's persistently <60 mL/min signify possible Chronic Kidney Disease.    Anion gap 9 5 - 15  CBC     Status: Abnormal   Collection Time: 10/19/15  5:09 AM  Result Value Ref Range   WBC 5.4 4.0 - 10.5 K/uL   RBC 3.81 (L) 4.22 - 5.81 MIL/uL   Hemoglobin 9.2 (L) 13.0 - 17.0 g/dL   HCT 30.4 (L) 39.0 - 52.0 %   MCV 79.8 78.0 - 100.0 fL   MCH 24.1 (L) 26.0 - 34.0 pg   MCHC 30.3 30.0 - 36.0 g/dL   RDW 17.5 (H) 11.5 - 15.5 %   Platelets 122 (L) 150 - 400 K/uL    ABGS No results for input(s): PHART, PO2ART, TCO2, HCO3 in  the last 72 hours.  Invalid input(s): PCO2 CULTURES Recent Results (from the past 240 hour(s))  Culture, blood (Routine X 2) w Reflex to ID Panel     Status: None (Preliminary result)   Collection Time: 10/17/15  8:50 PM  Result Value Ref Range Status   Specimen Description BLOOD RIGHT ARM  Final   Special Requests BOTTLES DRAWN AEROBIC AND ANAEROBIC 6CC  Final   Culture PENDING  Incomplete   Report Status PENDING  Incomplete  Culture, blood (Routine X 2) w Reflex to ID Panel     Status: None (Preliminary result)   Collection Time: 10/17/15  8:57 PM  Result Value Ref Range Status   Specimen Description RIGHT ANTECUBITAL  Final   Special Requests BOTTLES DRAWN AEROBIC AND ANAEROBIC [facility] EACH  Final   Culture PENDING  Incomplete   Report Status PENDING  Incomplete  MRSA PCR Screening     Status: None   Collection Time: 10/17/15 11:30 PM  Result Value Ref Range Status   MRSA by PCR NEGATIVE NEGATIVE Final    Comment:        The GeneXpert MRSA Assay (FDA approved for NASAL specimens only), is one component of a comprehensive MRSA colonization surveillance program. It is not intended to diagnose MRSA infection nor to guide or monitor treatment for MRSA infections.    Studies/Results: Dg Chest 2 View  10/17/2015  CLINICAL DATA:  Nonproductive cough for 2 days. EXAM: CHEST  2 VIEW COMPARISON:  03/29/2013 FINDINGS: 7.7 cm diameter masslike opacity in the left mid lung. This could represent rounded pneumonia but appearance is highly suspicious for neoplasm. Emphysematous changes are suggested in the lungs. Heart size and pulmonary vascularity are normal. No blunting of costophrenic angles. No pneumothorax. Calcified and tortuous aorta. Postoperative changes in the mediastinum. IMPRESSION: 7.7 cm diameter mass in the left mid lung highly suspicious for neoplasm. Rounded pneumonia less likely. Electronically Signed   By: Lucienne Capers M.D.   On: 10/17/2015 19:16   Ct Chest Wo  Contrast  10/17/2015  CLINICAL DATA:  Cough, abnormal chest radiograph EXAM: CT CHEST WITHOUT CONTRAST TECHNIQUE: Multidetector CT imaging of the chest was performed following the standard protocol without IV contrast. COMPARISON:  Chest radiograph dated 10/17/2015 FINDINGS: Mediastinum/Nodes: Cardiomegaly. No pericardial effusion. Hypodense blood pool relative to myocardium, suggesting anemia. Three vessel coronary atherosclerosis. Postsurgical changes related to prior CABG. Extensive atherosclerotic calcifications the abdominal aorta and  branch vessels. Mediastinal lymphadenopathy, including: --14 mm short axis low right paratracheal node (series 2/ image 24) --11 mm short axis left paratracheal/ AP window node (series 2/ image 25) --10 mm short axis subcarinal node (series 2/image 29) Visualized thyroid is unremarkable. Lungs/Pleura: 6.9 x 7.3 x 7.8 cm posterior left upper lobe mass, abutting the lateral aspect of the left chest wall, and abutting the left fissure and likely transgressing the left lower lobe (sagittal image 87). 11 mm satellite nodule in the superior segment left lower lobe (series 4/ image 26), suspicious for metastasis. Underlying mild centrilobular emphysematous changes. Small left and trace right pleural effusions.  No pneumothorax. Upper abdomen: Visualized upper abdomen is notable for vascular calcifications and small volume perihepatic ascites. Musculoskeletal: Degenerative changes of the visualized thoracolumbar spine. Median sternotomy. IMPRESSION: 7.8 cm posterior left upper lobe mass, as described above. Mass abuts the left chest wall and left fissure, possibly involving the left lower lobe. 11 mm satellite nodule in the superior segment left lower lobe, suspicious for metastasis. Associated mediastinal lymphadenopathy. Small left pleural effusion and trace right pleural effusion. Electronically Signed   By: Julian Hy M.D.   On: 10/17/2015 20:19    Medications:  Prior to  Admission:  Prescriptions prior to admission  Medication Sig Dispense Refill Last Dose  . acetaminophen (TYLENOL) 325 MG tablet Take 650 mg by mouth 2 (two) times daily.   10/17/2015  . allopurinol (ZYLOPRIM) 100 MG tablet TAKE 1 TABLET BY MOUTH AT BEDTIME 30 tablet 5 10/17/2015  . Ascorbic Acid (VITAMIN C) 100 MG tablet Take 100 mg by mouth daily.   10/16/2015  . aspirin 81 MG tablet Take 81 mg by mouth every morning.    10/16/2015  . carvedilol (COREG) 6.25 MG tablet TAKE 1 TABLET BY MOUTH TWICE DAILY 180 tablet 1 10/17/2015 at 0600  . donepezil (ARICEPT) 10 MG tablet Take 1 tablet (10 mg total) by mouth at bedtime. 30 tablet 6 10/17/2015  . enalapril (VASOTEC) 10 MG tablet TAKE 1 TABLET BY MOUTH EVERY MORNING. 30 tablet 5 10/16/2015  . ferrous sulfate 325 (65 FE) MG tablet Take 325 mg by mouth 2 (two) times daily.    10/17/2015  . furosemide (LASIX) 40 MG tablet TAKE 1 TABLET BY MOUTH DAILY. 30 tablet 5 10/16/2015  . hydrocortisone (PROCTOSOL HC) 2.5 % rectal cream APPLY RECTALLYBID AS NEEDED FOR HEMORRHOIDS. 29.328 g PRN Past Month  . Lidocaine-Hydrocortisone Ace 2-2 % KIT INSERT RECTALLY TWICE DAILY (Patient taking differently: Rectally as needed) 24 each 5 Past Month  . potassium chloride SA (K-DUR,KLOR-CON) 20 MEQ tablet TAKE 2 TABLETS BY MOUTH EVERY MORNING (Patient taking differently: TAKE 1 TABLET BY MOUTH 2 times daily) 60 tablet 5 10/17/2015  . Pyridoxine HCl (VITAMIN B-6 PO) Take 1 tablet by mouth daily.    10/16/2015  . simvastatin (ZOCOR) 40 MG tablet TAKE 1 TABLET BY MOUTH AT BEDTIME 30 tablet 5 10/17/2015  . tamsulosin (FLOMAX) 0.4 MG CAPS capsule TAKE 1 CAPSULE BY MOUTH DAILY AFTER SUPPER 30 capsule 5 10/17/2015  . fexofenadine (ALLEGRA) 180 MG tablet Take 1 tablet (180 mg total) by mouth daily. (Patient taking differently: Take 180 mg by mouth as needed for allergies. ) 30 tablet 5 More Than A Month   Scheduled: . allopurinol  100 mg Oral QHS  . benzonatate  200 mg Oral TID  . donepezil   10 mg Oral QHS  . ferrous sulfate  325 mg Oral BID  . oseltamivir  75 mg  Oral BID  . [START ON 10/21/2015] pantoprazole (PROTONIX) IV  40 mg Intravenous Q12H  . vitamin B-6  50 mg Oral Daily  . simvastatin  40 mg Oral QHS  . sodium chloride flush  3 mL Intravenous Q12H  . tamsulosin  0.4 mg Oral Daily  . vitamin C  250 mg Oral Daily   Continuous: . pantoprozole (PROTONIX) infusion 8 mg/hr (10/19/15 0557)   HDQ:[id number] **OR** acetaminophen, HYDROcodone-acetaminophen, ondansetron **OR** ondansetron (ZOFRAN) IV, zolpidem  Assesment: He was admitted with symptomatic anemia and GI bleeding. He has acute blood loss anemia. He was incidentally found to have elevated troponin which is not felt to be related to an acute coronary syndrome and to have a large lung mass. Discussion with the patient now and with his family yesterday reveals that they do not want any sort of workup or aggressive treatment. His situation is complicated by the fact that he lives alone and his family live in the [place] area and [place] Principal Problem:   Symptomatic anemia Active Problems:   Essential hypertension, benign   Mixed hyperlipidemia   Chronic systolic heart failure (HCC)   CKD (chronic kidney disease) stage 3, GFR 30-59 ml/min   Ischemic cardiomyopathy   Thrombocytopenia, unspecified (HCC)   Atrial fibrillation (HCC)   GI bleed   Lung mass   Anemia   Elevated troponin   Acute blood loss anemia   Cough   Arterial hypotension   Upper GI bleeding   Melena   Influenza A    Plan: I have gone ahead and taken the liberty of ordering palliative care consultation to help the family with decision making.    LOS: 2 days   Phong Isenberg L 10/19/2015, 7:58 AM

## 2015-10-19 NOTE — Consult Note (Signed)
   [facility] Inpatient Consult   10/19/2015  Evan Melendez [birth year] [id number]   Patient is currently active with [facility] for chronic disease management services.  Patient has been engaged by a [employer].  Our community based plan of care has focused on disease management and community resource support.  Patient will receive a post discharge transition of care call and will be evaluated for monthly home visits for assessments and disease process education.  Made Inpatient Case Manager aware that [facility] following. Of note, [facility] Care Management services does not replace or interfere with any services that are arranged by inpatient case management or social work.   For additional questions or referrals please contact:   Royetta Crochet. Laymond Purser, RN, BSN, [place] [phone]) Business Cell  [phone]) Toll Free Office

## 2015-10-20 LAB — BASIC METABOLIC PANEL
ANION GAP: 9 (ref 5–15)
BUN: 38 mg/dL — AB (ref 6–20)
CO2: 23 mmol/L (ref 22–32)
Calcium: 7.6 mg/dL — ABNORMAL LOW (ref 8.9–10.3)
Chloride: 104 mmol/L (ref 101–111)
Creatinine, Ser: 1.49 mg/dL — ABNORMAL HIGH (ref 0.61–1.24)
GFR, EST AFRICAN AMERICAN: 47 mL/min — AB (ref >60)
GFR, EST NON AFRICAN AMERICAN: 40 mL/min — AB (ref >60)
Glucose, Bld: 144 mg/dL — ABNORMAL HIGH (ref 65–99)
POTASSIUM: 4.3 mmol/L (ref 3.5–5.1)
SODIUM: 136 mmol/L (ref 135–145)

## 2015-10-20 LAB — MAGNESIUM: MAGNESIUM: 2 mg/dL (ref 1.7–2.4)

## 2015-10-20 LAB — CBC
HCT: 31.7 % — ABNORMAL LOW (ref 39.0–52.0)
HEMOGLOBIN: 9.5 g/dL — AB (ref 13.0–17.0)
MCH: 24.1 pg — ABNORMAL LOW (ref 26.0–34.0)
MCHC: 30 g/dL (ref 30.0–36.0)
MCV: 80.3 fL (ref 78.0–100.0)
PLATELETS: 150 10*3/uL (ref 150–400)
RBC: 3.95 MIL/uL — AB (ref 4.22–5.81)
RDW: 17.4 % — ABNORMAL HIGH (ref 11.5–15.5)
WBC: 6.5 10*3/uL (ref 4.0–10.5)

## 2015-10-20 MED ORDER — HYDROCODONE-HOMATROPINE 5-1.5 MG/5ML PO SYRP
5.0000 mL | ORAL_SOLUTION | ORAL | Status: DC | PRN
  Administered 2015-10-20 – 2015-10-21 (×3): 5 mL via ORAL
  Filled 2015-10-20 (×3): qty 5

## 2015-10-20 MED ORDER — IPRATROPIUM BROMIDE 0.02 % IN SOLN: 0.5000 mg | RESPIRATORY_TRACT | Status: DC | PRN

## 2015-10-20 MED ORDER — LEVALBUTEROL HCL 0.63 MG/3ML IN NEBU
0.6300 mg | INHALATION_SOLUTION | Freq: Four times a day (QID) | RESPIRATORY_TRACT | Status: DC
  Administered 2015-10-20 – 2015-10-22 (×7): 0.63 mg via RESPIRATORY_TRACT
  Filled 2015-10-20 (×7): qty 3

## 2015-10-20 MED ORDER — HALOPERIDOL LACTATE 5 MG/ML IJ SOLN: 2.0000 mg | Freq: Once | INTRAMUSCULAR | Status: DC

## 2015-10-20 MED ORDER — IPRATROPIUM BROMIDE 0.02 % IN SOLN
0.5000 mg | Freq: Four times a day (QID) | RESPIRATORY_TRACT | Status: DC
  Administered 2015-10-20 – 2015-10-22 (×6): 0.5 mg via RESPIRATORY_TRACT
  Filled 2015-10-20 (×6): qty 2.5

## 2015-10-20 MED ORDER — BUDESONIDE 0.25 MG/2ML IN SUSP
0.2500 mg | Freq: Two times a day (BID) | RESPIRATORY_TRACT | Status: DC
  Administered 2015-10-20 – 2015-10-22 (×5): 0.25 mg via RESPIRATORY_TRACT
  Filled 2015-10-20 (×5): qty 2

## 2015-10-20 MED ORDER — GUAIFENESIN ER 600 MG PO TB12
1200.0000 mg | ORAL_TABLET | Freq: Two times a day (BID) | ORAL | Status: DC
  Administered 2015-10-20 – 2015-10-22 (×5): 1200 mg via ORAL
  Filled 2015-10-20 (×5): qty 2

## 2015-10-20 MED ORDER — AMOXICILLIN-POT CLAVULANATE 875-125 MG PO TABS
1.0000 | ORAL_TABLET | Freq: Two times a day (BID) | ORAL | Status: DC
  Administered 2015-10-20 – 2015-10-22 (×5): 1 via ORAL
  Filled 2015-10-20 (×8): qty 1

## 2015-10-20 MED ORDER — LEVALBUTEROL HCL 0.63 MG/3ML IN NEBU
0.6300 mg | INHALATION_SOLUTION | RESPIRATORY_TRACT | Status: DC | PRN
Start: 2015-10-20 — End: 2015-10-22

## 2015-10-20 MED ORDER — METHYLPREDNISOLONE SODIUM SUCC 125 MG IJ SOLR
60.0000 mg | Freq: Once | INTRAMUSCULAR | Status: AC
  Administered 2015-10-20: 60 mg via INTRAVENOUS
  Filled 2015-10-20: qty 2

## 2015-10-20 MED ORDER — ARFORMOTEROL TARTRATE 15 MCG/2ML IN NEBU
15.0000 ug | INHALATION_SOLUTION | Freq: Two times a day (BID) | RESPIRATORY_TRACT | Status: DC
  Administered 2015-10-20 – 2015-10-22 (×5): 15 ug via RESPIRATORY_TRACT
  Filled 2015-10-20 (×5): qty 2

## 2015-10-20 NOTE — Care Management Note (Signed)
Case Management Note  Patient Details  Name: Evan Melendez MRN: [id number] Date of Birth: April 29, 1926  Subjective/Objective:                  Pt is from home, lives alone, has dementia. Son and daughter live out of town but have daily aid care for him arranged through [facility] prior to admission. Pt's daughter has requested info on Hospice services and PD agencies.  Action/Plan: CM gave pt's daughter list of PD agencies, she will make arrangements for pt to have 24/7 supervision and list of Hospice agencies. Pt's daughter has chosen [facility] and wishes for them to start services once pt is discharged. Referral will be made with [facility] prior to DC.  DME needs unknown at this time. Per daughter pt should not have any DME needs. Will cont to follow.  Expected Discharge Date:     10/22/2015             Expected Discharge Plan:  Home w Hospice Care  In-House Referral:  NA  Discharge planning Services  CM Consult  Post Acute Care Choice:  Hospice Choice offered to:  Adult Children  DME Arranged:    DME Agency:     HH Arranged:  RN [place] Agency:  [facility]  Status of Service:  In process, will continue to follow  Medicare Important Message Given:    Date Medicare IM Given:    Medicare IM give by:    Date Additional Medicare IM Given:    Additional Medicare Important Message give by:     If discussed at [place] of Stay Meetings, dates discussed:    Additional Comments:  Sherald Barge, RN 10/20/2015, 3:32 PM

## 2015-10-20 NOTE — Progress Notes (Signed)
Subjective: He says he feels okay. No pain. No shortness of breath. Discussed again with his daughter at bedside and she is leaning toward hospice care at this point. He is also a veteran and may be able to have some help from the [facility]  Objective: Vital signs in last 24 hours: Temp:  [97.8 F (36.6 C)-98.4 F (36.9 C)] 97.8 F (36.6 C) (03/28 0400) Pulse Rate:  [66-113] 101 (03/28 0200) Resp:  [22-32] 31 (03/28 0600) BP: (88-156)/(52-112) 118/81 mmHg (03/28 0600) SpO2:  [87 %-95 %] 87 % (03/28 0200) Weight:  [70.2 kg (154 lb 12.2 oz)] 70.2 kg (154 lb 12.2 oz) (03/28 0500) Weight change: 3.4 kg (7 lb 7.9 oz) Last BM Date: 10/17/15  Intake/Output from previous day: 03/27 0701 - 03/28 0700 In: 2205 [P.O.:720; I.V.:1485] Out: 800 [Urine:800]  PHYSICAL EXAM General appearance: alert, no distress and Mildly confused Resp: rhonchi bilaterally Cardio: irregularly irregular rhythm GI: soft, non-tender; bowel sounds normal; no masses,  no organomegaly Extremities: extremities normal, atraumatic, no cyanosis or edema  Lab Results:  Results for orders placed or performed during the hospital encounter of 10/17/15 (from the past 48 hour(s))  Glucose, capillary     Status: Abnormal   Collection Time: 10/18/15  8:03 AM  Result Value Ref Range   Glucose-Capillary 101 (H) 65 - 99 mg/dL   Comment 1 Notify RN    Comment 2 Document in Chart   Comprehensive metabolic panel     Status: Abnormal   Collection Time: 10/18/15  9:01 AM  Result Value Ref Range   Sodium 138 135 - 145 mmol/L   Potassium 5.1 3.5 - 5.1 mmol/L   Chloride 109 101 - 111 mmol/L   CO2 21 (L) 22 - 32 mmol/L   Glucose, Bld 145 (H) 65 - 99 mg/dL   BUN 43 (H) 6 - 20 mg/dL   Creatinine, Ser 1.99 (H) 0.61 - 1.24 mg/dL   Calcium 8.0 (L) 8.9 - 10.3 mg/dL   Total Protein 5.2 (L) 6.5 - 8.1 g/dL   Albumin 2.8 (L) 3.5 - 5.0 g/dL   AST 81 (H) 15 - 41 U/L   ALT 77 (H) 17 - 63 U/L   Alkaline Phosphatase 111 38 -  126 U/L   Total Bilirubin 1.3 (H) 0.3 - 1.2 mg/dL   GFR calc non Af Amer 35 (L) >60 mL/min   GFR calc Af Amer 41 (L) >60 mL/min    Comment: (NOTE) The eGFR has been calculated using the CKD EPI equation. This calculation has not been validated in all clinical situations. eGFR's persistently <60 mL/min signify possible Chronic Kidney Disease.    Anion gap 8 5 - 15  Troponin I     Status: Abnormal   Collection Time: 10/18/15  9:01 AM  Result Value Ref Range   Troponin I 0.08 (H) <0.031 ng/mL    Comment:        PERSISTENTLY INCREASED TROPONIN VALUES IN THE RANGE OF 0.04-0.49 ng/mL CAN BE SEEN IN:       -UNSTABLE ANGINA       -CONGESTIVE HEART FAILURE       -MYOCARDITIS       -CHEST TRAUMA       -ARRYHTHMIAS       -LATE PRESENTING MYOCARDIAL INFARCTION       -COPD   CLINICAL FOLLOW-UP RECOMMENDED.   Hemoglobin and hematocrit, blood     Status: Abnormal   Collection Time: 10/18/15  9:01 AM  Result Value  Ref Range   Hemoglobin 9.3 (L) 13.0 - 17.0 g/dL    Comment: DELTA CHECK NOTED POST TRANSFUSION SPECIMEN    HCT 30.5 (L) 39.0 - 52.0 %  Influenza panel by PCR (type A & B, H1N1)     Status: Abnormal   Collection Time: 10/18/15 12:15 PM  Result Value Ref Range   Influenza A By PCR POSITIVE (A) NEGATIVE   Influenza B By PCR NEGATIVE NEGATIVE   H1N1 flu by pcr NOT DETECTED NOT DETECTED    Comment:        The Xpert Flu assay (FDA approved for nasal aspirates or washes and nasopharyngeal swab specimens), is intended as an aid in the diagnosis of influenza and should not be used as a sole basis for treatment.   Troponin I     Status: Abnormal   Collection Time: 10/18/15  3:40 PM  Result Value Ref Range   Troponin I 0.09 (H) <0.031 ng/mL    Comment:        PERSISTENTLY INCREASED TROPONIN VALUES IN THE RANGE OF 0.04-0.49 ng/mL CAN BE SEEN IN:       -UNSTABLE ANGINA       -CONGESTIVE HEART FAILURE       -MYOCARDITIS       -CHEST TRAUMA       -ARRYHTHMIAS       -LATE  PRESENTING MYOCARDIAL INFARCTION       -COPD   CLINICAL FOLLOW-UP RECOMMENDED.   Hemoglobin and hematocrit, blood     Status: Abnormal   Collection Time: 10/18/15  3:40 PM  Result Value Ref Range   Hemoglobin 9.5 (L) 13.0 - 17.0 g/dL   HCT 31.5 (L) 39.0 - 30.0 %  Basic metabolic panel     Status: Abnormal   Collection Time: 10/19/15  5:09 AM  Result Value Ref Range   Sodium 138 135 - 145 mmol/L   Potassium 4.5 3.5 - 5.1 mmol/L   Chloride 110 101 - 111 mmol/L   CO2 19 (L) 22 - 32 mmol/L   Glucose, Bld 105 (H) 65 - 99 mg/dL   BUN 42 (H) 6 - 20 mg/dL   Creatinine, Ser 1.53 (H) 0.61 - 1.24 mg/dL   Calcium 8.1 (L) 8.9 - 10.3 mg/dL   GFR calc non Af Amer 39 (L) >60 mL/min   GFR calc Af Amer 45 (L) >60 mL/min    Comment: (NOTE) The eGFR has been calculated using the CKD EPI equation. This calculation has not been validated in all clinical situations. eGFR's persistently <60 mL/min signify possible Chronic Kidney Disease.    Anion gap 9 5 - 15  CBC     Status: Abnormal   Collection Time: 10/19/15  5:09 AM  Result Value Ref Range   WBC 5.4 4.0 - 10.5 K/uL   RBC 3.81 (L) 4.22 - 5.81 MIL/uL   Hemoglobin 9.2 (L) 13.0 - 17.0 g/dL   HCT 30.4 (L) 39.0 - 52.0 %   MCV 79.8 78.0 - 100.0 fL   MCH 24.1 (L) 26.0 - 34.0 pg   MCHC 30.3 30.0 - 36.0 g/dL   RDW 17.5 (H) 11.5 - 15.5 %   Platelets 122 (L) 150 - 400 K/uL  Glucose, capillary     Status: Abnormal   Collection Time: 10/19/15  8:02 AM  Result Value Ref Range   Glucose-Capillary 103 (H) 65 - 99 mg/dL   Comment 1 Notify RN    Comment 2 Document in Chart   CBC  Status: Abnormal   Collection Time: 10/20/15  4:39 AM  Result Value Ref Range   WBC 6.5 4.0 - 10.5 K/uL   RBC 3.95 (L) 4.22 - 5.81 MIL/uL   Hemoglobin 9.5 (L) 13.0 - 17.0 g/dL   HCT 31.7 (L) 39.0 - 52.0 %   MCV 80.3 78.0 - 100.0 fL   MCH 24.1 (L) 26.0 - 34.0 pg   MCHC 30.0 30.0 - 36.0 g/dL   RDW 17.4 (H) 11.5 - 15.5 %   Platelets 150 150 - 400 K/uL  Basic metabolic  panel     Status: Abnormal   Collection Time: 10/20/15  4:39 AM  Result Value Ref Range   Sodium 136 135 - 145 mmol/L   Potassium 4.3 3.5 - 5.1 mmol/L   Chloride 104 101 - 111 mmol/L   CO2 23 22 - 32 mmol/L   Glucose, Bld 144 (H) 65 - 99 mg/dL   BUN 38 (H) 6 - 20 mg/dL   Creatinine, Ser 1.49 (H) 0.61 - 1.24 mg/dL   Calcium 7.6 (L) 8.9 - 10.3 mg/dL   GFR calc non Af Amer 40 (L) >60 mL/min   GFR calc Af Amer 47 (L) >60 mL/min    Comment: (NOTE) The eGFR has been calculated using the CKD EPI equation. This calculation has not been validated in all clinical situations. eGFR's persistently <60 mL/min signify possible Chronic Kidney Disease.    Anion gap 9 5 - 15  Magnesium     Status: None   Collection Time: 10/20/15  4:39 AM  Result Value Ref Range   Magnesium 2.0 1.7 - 2.4 mg/dL    ABGS No results for input(s): PHART, PO2ART, TCO2, HCO3 in the last 72 hours.  Invalid input(s): PCO2 CULTURES Recent Results (from the past 240 hour(s))  Culture, blood (Routine X 2) w Reflex to ID Panel     Status: None (Preliminary result)   Collection Time: 10/17/15  8:50 PM  Result Value Ref Range Status   Specimen Description BLOOD RIGHT ARM  Final   Special Requests BOTTLES DRAWN AEROBIC AND ANAEROBIC 6CC  Final   Culture NO GROWTH 2 DAYS  Final   Report Status PENDING  Incomplete  Culture, blood (Routine X 2) w Reflex to ID Panel     Status: None (Preliminary result)   Collection Time: 10/17/15  8:57 PM  Result Value Ref Range Status   Specimen Description RIGHT ANTECUBITAL  Final   Special Requests BOTTLES DRAWN AEROBIC AND ANAEROBIC 6CC EACH  Final   Culture NO GROWTH 2 DAYS  Final   Report Status PENDING  Incomplete  MRSA PCR Screening     Status: None   Collection Time: 10/17/15 11:30 PM  Result Value Ref Range Status   MRSA by PCR NEGATIVE NEGATIVE Final    Comment:        The GeneXpert MRSA Assay (FDA approved for NASAL specimens only), is one component of a comprehensive  MRSA colonization surveillance program. It is not intended to diagnose MRSA infection nor to guide or monitor treatment for MRSA infections.   Urine culture     Status: None   Collection Time: 10/18/15  3:00 AM  Result Value Ref Range Status   Specimen Description URINE, CLEAN CATCH  Final   Special Requests NONE  Final   Culture   Final    NO GROWTH 1 DAY Performed at [facility]    Report Status 10/19/2015 FINAL  Final   Studies/Results: No results  found.  Medications:  Prior to Admission:  Prescriptions prior to admission  Medication Sig Dispense Refill Last Dose  . acetaminophen (TYLENOL) 325 MG tablet Take 650 mg by mouth 2 (two) times daily.   10/17/2015  . allopurinol (ZYLOPRIM) 100 MG tablet TAKE 1 TABLET BY MOUTH AT BEDTIME 30 tablet 5 10/17/2015  . Ascorbic Acid (VITAMIN C) 100 MG tablet Take 100 mg by mouth daily.   10/16/2015  . aspirin 81 MG tablet Take 81 mg by mouth every morning.    10/16/2015  . carvedilol (COREG) 6.25 MG tablet TAKE 1 TABLET BY MOUTH TWICE DAILY 180 tablet 1 10/17/2015 at 0600  . donepezil (ARICEPT) 10 MG tablet Take 1 tablet (10 mg total) by mouth at bedtime. 30 tablet 6 10/17/2015  . enalapril (VASOTEC) 10 MG tablet TAKE 1 TABLET BY MOUTH EVERY MORNING. 30 tablet 5 10/16/2015  . ferrous sulfate 325 (65 FE) MG tablet Take 325 mg by mouth 2 (two) times daily.    10/17/2015  . furosemide (LASIX) 40 MG tablet TAKE 1 TABLET BY MOUTH DAILY. 30 tablet 5 10/16/2015  . hydrocortisone (PROCTOSOL HC) 2.5 % rectal cream APPLY RECTALLYBID AS NEEDED FOR HEMORRHOIDS. 29.328 g PRN Past Month  . Lidocaine-Hydrocortisone Ace 2-2 % KIT INSERT RECTALLY TWICE DAILY (Patient taking differently: Rectally as needed) 24 each 5 Past Month  . potassium chloride SA (K-DUR,KLOR-CON) 20 MEQ tablet TAKE 2 TABLETS BY MOUTH EVERY MORNING (Patient taking differently: TAKE 1 TABLET BY MOUTH 2 times daily) 60 tablet 5 10/17/2015  . Pyridoxine HCl (VITAMIN B-6 PO) Take 1 tablet by  mouth daily.    10/16/2015  . simvastatin (ZOCOR) 40 MG tablet TAKE 1 TABLET BY MOUTH AT BEDTIME 30 tablet 5 10/17/2015  . tamsulosin (FLOMAX) 0.4 MG CAPS capsule TAKE 1 CAPSULE BY MOUTH DAILY AFTER SUPPER 30 capsule 5 10/17/2015  . fexofenadine (ALLEGRA) 180 MG tablet Take 1 tablet (180 mg total) by mouth daily. (Patient taking differently: Take 180 mg by mouth as needed for allergies. ) 30 tablet 5 More Than A Month   Scheduled: . allopurinol  100 mg Oral QHS  . benzonatate  200 mg Oral TID  . carvedilol  3.125 mg Oral BID  . donepezil  10 mg Oral QHS  . ferrous sulfate  325 mg Oral BID  . fluticasone  2 spray Each Nare Daily  . loratadine  10 mg Oral Daily  . oseltamivir  30 mg Oral BID  . pantoprazole (PROTONIX) IV  40 mg Intravenous Q12H  . vitamin B-6  50 mg Oral Daily  . simvastatin  40 mg Oral QHS  . sodium chloride flush  3 mL Intravenous Q12H  . tamsulosin  0.4 mg Oral Daily  . vitamin C  250 mg Oral Daily   Continuous: .  sodium bicarbonate 150 mEq in sterile water 1000 mL infusion 75 mL/hr at 10/19/15 2152   IWP:[id number] **OR** acetaminophen, HYDROcodone-acetaminophen, ondansetron **OR** ondansetron (ZOFRAN) IV, zolpidem  Assesment: He was admitted with GI bleeding and symptomatic anemia. He is improved as far as that is concerned. He was found to have a large lung mass and this is very likely primary lung cancer. There are no plans to pursue diagnosis or treatment of that. Principal Problem:   Symptomatic anemia Active Problems:   Essential hypertension, benign   Mixed hyperlipidemia   Chronic systolic heart failure (HCC)   CKD (chronic kidney disease) stage 3, GFR 30-59 ml/min   Ischemic cardiomyopathy   Thrombocytopenia, unspecified ([place])  Atrial fibrillation (HCC)   GI bleed   Lung mass   Anemia   Elevated troponin   Acute blood loss anemia   Cough   Arterial hypotension   Upper GI bleeding   Melena   Influenza A   Palliative care encounter   DNR (do  not resuscitate) discussion    Plan: Continue current treatments.    LOS: 3 days   Lareta Bruneau L 10/20/2015, 7:37 AM

## 2015-10-20 NOTE — Progress Notes (Signed)
[facility] PROGRESS NOTE  Evan Melendez SWN:[id number] DOB: 30-Nov-1925 DOA: 10/17/2015 PCP: Mickie Hillier, MD  Brief interval history HPI: Evan Melendez is a 82 y.o. male with PMH of dementia, hypertension, chronic systolic CHF, chronic atrial fibrillation, and remote tobacco abuse presents to the ED with 2 days of nonproductive cough, malaise, and fatigue. Patient lives alone but has a caretaker visits during the day. He had been noted to be much less active the past 2 days and upon questioning, he endorsed a nonproductive cough, malaise, and fatigue. He denied any pain, fevers, nausea, vomiting, or diarrhea. He denied any dysuria. Up until 2 days ago, patient had been in his usual state of health and fairly active throughout the day. Patient denies any recent fall or trauma. He admits to using his medications inconsistently.  In ED, patient was found to be afebrile, saturating adequately on room air, initially with good blood pressure, and then with BP persisting in the 85/50 range. EKG featured atrial fibrillation with T-wave inversions in the anterolateral leads. Chest x-ray featured a 7.7 cm mass in the mid left lung and this was followed by noncontrast chest CT. On chest CT, a 7.8 cm mass was identified in the posterior left upper lobe with a 11 mm satellite nodule in the left lower lobe as well as mediastinal lymphadenopathy. Blood work returned most notable for a hemoglobin of 6.5 and DRE produced melanotic stool that was FOBT positive. Patient was bolused with 1 L of normal saline and 2 units of packed red blood cells were ordered for immediate transfusion. EDP discussed the CT findings with the patient and his daughter. Blood pressure remained on the low side, but the patient remained asymptomatic. He'll be admitted to the stepdown unit for ongoing evaluation and management of GI bleed with symptomatic anemia.  Patient was admitted placed in the step down unit as he was having  borderline hypotension and transfuse 2 units packed red blood cells. Patient had serial H&H's done then hemoglobin sent to stabilize. GI was consulted and recommended conservative measures at this time. Patient was also noted to have a lung mass on CT chest and chest x-ray and a such pulmonary consultation was obtained. Pulmonary met with the family and decision was made for conservative measures and as such palliative care consultation was obtained. Patient was also noted to have a rhonchorous cough which she had initially presented with. Workup was negative for pneumonia. Patient was noted to be positive for influenza a.m. placed on Tamiflu. Patient was also noted to have some wheezing received a dose of IV Solu-Medrol is currently on scheduled nebulizer treatments and Mucinex. Supportive care.      Assessment/Plan: #1 symptomatic anemia/probable GI bleed Patient had presented with generalized malaise and fatigue and a cough. Patient noted admission to have a hemoglobin of 6.5. Digital rectal exam done noted melanotic stool. FOBT was positive. Patient denies any NSAID use however is on aspirin daily. Patient denies any overt melanotic bleeding. Patient was given Protonix 80 mg IV push 1.  Patient was on a Protonix drip which has been transitioned to oral Protonix. GI. Patient status post 2 units packed red blood cells with hemoglobin currently at 9.5.  GI following and  recommended no endoscopic evaluation at this time unless overt GI bleed. GI ff.  #2 acute blood loss anemia Secondary to problem #1. Status post transfusion of 2 units packed red blood cells. H&H stable.  #3 elevated troponin Likely secondary to demand ischemia secondary  to problem #1. Elevated troponins seem to have plateaued. Patient denies any chest pain. EKG with A. fib with borderline T-wave abnormalities.  2-D echo with a EF of 20-25% with diffuse hypokinesis worse in the inferior and inferoseptal walls. Septal motion showed  abnormal function dyssynergy.  Hypokinesis with inferior akinesis seen on prior 2-D echo of 2014 with a EF of 30%.  Continue beta blocker. Continue to hold ACE inhibitor secondary to borderline hypotension.  Medical management for now as patient with a lung mass likely metastatic in nature and family wanting conservative measures as well as patient and as such palliative care consultation pending.  #4 cough/ influenza A Questionable etiology. Chest x-ray and CT chest negative for pneumonia however consistent with a lung mass which might be the etiology of his cough.  Influenza PCR positive for influenza A. Patient has been started on Tamiflu. Discontinue Tessalon Perles and place on Mucinex twice daily as patient seems to have a rhonchorous cough and some wheezing. Given a dose of IV Solu-Medrol. Place on nebulizer treatments. Continue Claritin  and Flonase as patient is  complaining of congestion. Hycodan as needed.  #5 atrial fibrillation CHADS2VASC = 4 Heart rate currently controlled. Beta blocker on hold secondary to hypotension on admission. Unable to place on anticoagulation at this time due to problem #1.  #6 chronic thrombocytopenia Stable. Monitor.  #7 chronic kidney disease stage III Stable.  #8 hypotension Likely secondary to problem #1. Improved with IV fluids and transfusion. Antihypertensive medications on hold.  IV fluids.Follow.  #9 chronic systolic heart failure 2-D echo from 09/14/2012 with a EF of 30% with severe LVH. Patient was initially hypotensive on admission and as such diuretics and beta blocker and ACE inhibitor on hold.  Resumed Coreg at half home dose at 3.125 mg twice a day.  #10 lung mass Concern for cancerous lesion and probable metastases.  Patient has been seen in consultation by pulmonary and family is leaning towards conservative measures. Palliative care consultation has assessed the patient and goal at this time is to treat the reversible conditions,  symptomatic management, no heroic measures, home with home health versus hospice. DO NOT RESUSCITATE. No further treatment for lung mass. Pulmonary following and appreciate input and recommendations.  #11 Hypertension Patient noted to be hypotensive on admission and as such antihypertensive medications have been held. BP improving. Follow.  #12 prophylaxis PPI for GI prophylaxis. SCDs for DVT prophylaxis.   Code Status: Full Family Communication: Updated patient and daughter at bedside. Disposition Plan: Transfer to telemetry. Home with home health versus hospice, once medically stable and GI bleed stabilized. Palliative care consultation ff   Consultants:  Gastroenterology: Dr Gala Romney 10/18/2015  Pulmonary Hawkins 10/18/2015  Palliative care: Dr.Anwar 10/19/2015  Procedures:  2 units packed red blood cells 10/18/2015.  Chest x-ray 10/17/2015  CT chest 10/17/2015    2-D echo 10/18/2015  Antibiotics:  None  HPI/Subjective: Patient states feeling better. No melanotic stools. No CP. No SOB. Patient c/o congestion  Objective: Filed Vitals:   10/20/15 0500 10/20/15 0600  BP: 129/67 118/81  Pulse:    Temp:    Resp: 22 31    Intake/Output Summary (Last 24 hours) at 10/20/15 0938 Last data filed at 10/20/15 0600  Gross per 24 hour  Intake   1965 ml  Output    800 ml  Net   1165 ml   Filed Weights   10/17/15 2325 10/19/15 0500 10/20/15 0500  Weight: 66.6 kg (146 lb 13.2 oz) 66.8  kg (147 lb 4.3 oz) 70.2 kg (154 lb 12.2 oz)    Exam:   General:  NAD  Cardiovascular: Irregularly irregular  Respiratory: Some scattered coarse BS. Min-mild expiratory wheezing. Congested. No crackles.  Abdomen: Soft, nontender, nondistended, positive bowel sounds.  Musculoskeletal: No clubbing cyanosis or edema.  Data Reviewed: Basic Metabolic Panel:  Recent Labs Lab 10/17/15 2050 10/18/15 0901 10/19/15 0509 10/20/15 0439  NA 138 138 138 136  K 4.9 5.1 4.5 4.3  CL 108 109  110 104  CO2 22 21* 19* 23  GLUCOSE 115* 145* 105* 144*  BUN 44* 43* 42* 38*  CREATININE 1.71* 1.66* 1.53* 1.49*  CALCIUM 7.9* 8.0* 8.1* 7.6*  MG  --   --   --  2.0   Liver Function Tests:  Recent Labs Lab 10/18/15 0901  AST 81*  ALT 77*  ALKPHOS 111  BILITOT 1.3*  PROT 5.2*  ALBUMIN 2.8*   No results for input(s): LIPASE, AMYLASE in the last 168 hours. No results for input(s): AMMONIA in the last 168 hours. CBC:  Recent Labs Lab 10/17/15 2050 10/18/15 0901 10/18/15 1540 10/19/15 0509 10/20/15 0439  WBC 4.7  --   --  5.4 6.5  NEUTROABS 3.7  --   --   --   --   HGB 6.5* 9.3* 9.5* 9.2* 9.5*  HCT 22.6* 30.5* 31.5* 30.4* 31.7*  MCV 79.6  --   --  79.8 80.3  PLT 134*  --   --  122* 150   Cardiac Enzymes:  Recent Labs Lab 10/17/15 2050 10/18/15 0901 10/18/15 1540  TROPONINI 0.13*  0.13* 0.08* 0.09*   BNP (last 3 results) No results for input(s): BNP in the last 8760 hours.  ProBNP (last 3 results) No results for input(s): PROBNP in the last 8760 hours.  CBG:  Recent Labs Lab 10/18/15 0803 10/19/15 0802  GLUCAP 101* 103*    Recent Results (from the past 240 hour(s))  Culture, blood (Routine X 2) w Reflex to ID Panel     Status: None (Preliminary result)   Collection Time: 10/17/15  8:50 PM  Result Value Ref Range Status   Specimen Description BLOOD RIGHT ARM  Final   Special Requests BOTTLES DRAWN AEROBIC AND ANAEROBIC 6CC  Final   Culture NO GROWTH 2 DAYS  Final   Report Status PENDING  Incomplete  Culture, blood (Routine X 2) w Reflex to ID Panel     Status: None (Preliminary result)   Collection Time: 10/17/15  8:57 PM  Result Value Ref Range Status   Specimen Description RIGHT ANTECUBITAL  Final   Special Requests BOTTLES DRAWN AEROBIC AND ANAEROBIC 6CC EACH  Final   Culture NO GROWTH 2 DAYS  Final   Report Status PENDING  Incomplete  MRSA PCR Screening     Status: None   Collection Time: 10/17/15 11:30 PM  Result Value Ref Range Status    MRSA by PCR NEGATIVE NEGATIVE Final    Comment:        The GeneXpert MRSA Assay (FDA approved for NASAL specimens only), is one component of a comprehensive MRSA colonization surveillance program. It is not intended to diagnose MRSA infection nor to guide or monitor treatment for MRSA infections.   Urine culture     Status: None   Collection Time: 10/18/15  3:00 AM  Result Value Ref Range Status   Specimen Description URINE, CLEAN CATCH  Final   Special Requests NONE  Final   Culture  Final    NO GROWTH 1 DAY Performed at [facility]    Report Status 10/19/2015 FINAL  Final     Studies: No results found.  Scheduled Meds: . allopurinol  100 mg Oral QHS  . benzonatate  200 mg Oral TID  . carvedilol  3.125 mg Oral BID  . donepezil  10 mg Oral QHS  . ferrous sulfate  325 mg Oral BID  . fluticasone  2 spray Each Nare Daily  . loratadine  10 mg Oral Daily  . oseltamivir  30 mg Oral BID  . pantoprazole (PROTONIX) IV  40 mg Intravenous Q12H  . vitamin B-6  50 mg Oral Daily  . simvastatin  40 mg Oral QHS  . sodium chloride flush  3 mL Intravenous Q12H  . tamsulosin  0.4 mg Oral Daily  . vitamin C  250 mg Oral Daily   Continuous Infusions: .  sodium bicarbonate 150 mEq in sterile water 1000 mL infusion 75 mL/hr at 10/19/15 2152    Principal Problem:   Symptomatic anemia Active Problems:   GI bleed   Acute blood loss anemia   Influenza A   Essential hypertension, benign   Mixed hyperlipidemia   Chronic systolic heart failure (HCC)   CKD (chronic kidney disease) stage 3, GFR 30-59 ml/min   Ischemic cardiomyopathy   Thrombocytopenia, unspecified (HCC)   Atrial fibrillation (HCC)   Lung mass   Anemia   Elevated troponin   Cough   Arterial hypotension   Upper GI bleeding   Melena   Palliative care encounter   DNR (do not resuscitate) discussion    Time spent: 42 mins    [facility] MD [facility] Pager [phone]. If 7PM-7AM, please  contact night-coverage at [email or web address], password [facility] 10/20/2015, 9:38 AM  LOS: 3 days

## 2015-10-21 DIAGNOSIS — I255 Ischemic cardiomyopathy: Secondary | ICD-10-CM

## 2015-10-21 DIAGNOSIS — I482 Chronic atrial fibrillation: Secondary | ICD-10-CM

## 2015-10-21 LAB — TYPE AND SCREEN
ABO/RH(D): B POS
ANTIBODY SCREEN: NEGATIVE
UNIT DIVISION: 0
Unit division: 0
Unit division: 0

## 2015-10-21 LAB — GLUCOSE, CAPILLARY: Glucose-Capillary: 129 mg/dL — ABNORMAL HIGH (ref 65–99)

## 2015-10-21 MED ORDER — FUROSEMIDE 20 MG PO TABS
20.0000 mg | ORAL_TABLET | Freq: Every day | ORAL | Status: DC
  Administered 2015-10-21 – 2015-10-22 (×2): 20 mg via ORAL
  Filled 2015-10-21 (×2): qty 1

## 2015-10-21 NOTE — Progress Notes (Signed)
[facility] PROGRESS NOTE  Evan Melendez UEA:[id number] DOB: Aug 29, 1925 DOA: 10/17/2015 PCP: Mickie Hillier, MD  Brief interval history HPI: Evan Melendez is a 82 y.o. male with PMH of dementia, hypertension, chronic systolic CHF, chronic atrial fibrillation, and remote tobacco abuse presents to the ED with 2 days of nonproductive cough, malaise, and fatigue. Patient lives alone but has a caretaker visits during the day. He had been noted to be much less active the past 2 days and upon questioning, he endorsed a nonproductive cough, malaise, and fatigue. He denied any pain, fevers, nausea, vomiting, or diarrhea. He denied any dysuria. Up until 2 days ago, patient had been in his usual state of health and fairly active throughout the day. Patient denies any recent fall or trauma. He admits to using his medications inconsistently.  In ED, patient was found to be afebrile, saturating adequately on room air, initially with good blood pressure, and then with BP persisting in the 85/50 range. EKG featured atrial fibrillation with T-wave inversions in the anterolateral leads. Chest x-ray featured a 7.7 cm mass in the mid left lung and this was followed by noncontrast chest CT. On chest CT, a 7.8 cm mass was identified in the posterior left upper lobe with a 11 mm satellite nodule in the left lower lobe as well as mediastinal lymphadenopathy. Blood work returned most notable for a hemoglobin of 6.5 and DRE produced melanotic stool that was FOBT positive. Patient was bolused with 1 L of normal saline and 2 units of packed red blood cells were ordered for immediate transfusion. EDP discussed the CT findings with the patient and his daughter. Blood pressure remained on the low side, but the patient remained asymptomatic. He'll be admitted to the stepdown unit for ongoing evaluation and management of GI bleed with symptomatic anemia.  Patient was admitted placed in the step down unit as he was having  borderline hypotension and transfuse 2 units packed red blood cells. Patient had serial H&H's done then hemoglobin sent to stabilize. GI was consulted and recommended conservative measures at this time. Patient was also noted to have a lung mass on CT chest and chest x-ray and a such pulmonary consultation was obtained. Pulmonary met with the family and decision was made for conservative measures and as such palliative care consultation was obtained. Patient was also noted to have a rhonchorous cough which she had initially presented with. Workup was negative for pneumonia. Patient was noted to be positive for influenza a.m. placed on Tamiflu. Patient was also noted to have some wheezing received a dose of IV Solu-Medrol is currently on scheduled nebulizer treatments and Mucinex. Supportive care.      Assessment/Plan: 1 symptomatic anemia/probable GI bleed Patient had presented with generalized malaise and fatigue and a cough. Patient noted admission to have a hemoglobin of 6.5. Digital rectal exam done noted melanotic stool. FOBT was positive. Patient denies any NSAID use however is on aspirin daily. Patient denies any overt melanotic bleeding. Patient was given Protonix 80 mg IV push 1.  Patient was on a Protonix drip which has been transitioned to oral Protonix. GI. Patient status post 2 units packed red blood cells with hemoglobin currently at 9.5.  GI following and  recommended no endoscopic evaluation at this time unless overt GI bleed. GI ff.  -Hb stable.   #2 acute blood loss anemia Secondary to problem #1. Status post transfusion of 2 units packed red blood cells. H&H stable. Hb stable.   #3 elevated  troponin Likely secondary to demand ischemia secondary to problem #1. Elevated troponins seem to have plateaued. Patient denies any chest pain. EKG with A. fib with borderline T-wave abnormalities.  2-D echo with a EF of 20-25% with diffuse hypokinesis worse in the inferior and inferoseptal  walls. Septal motion showed abnormal function dyssynergy.  Hypokinesis with inferior akinesis seen on prior 2-D echo of 2014 with a EF of 30%.  Continue beta blocker. Continue to hold ACE inhibitor secondary to borderline hypotension.  Medical management for now as patient with a lung mass likely metastatic in nature and family wanting conservative measures .  #4 cough/ influenza A Questionable etiology. Chest x-ray and CT chest negative for pneumonia however consistent with a lung mass which might be the etiology of his cough.  Influenza PCR positive for influenza A. Patient has been started on Tamiflu. Discontinue Tessalon Perles and place on Mucinex twice daily as patient seems to have a rhonchorous cough and some wheezing. Given a dose of IV Solu-Medrol. Place on nebulizer treatments. Continue Claritin  and Flonase .  Hycodan as needed.  #5 atrial fibrillation CHADS2VASC = 4 Continue with coreg.   Unable to place on anticoagulation at this time due to problem #1.  #6 chronic thrombocytopenia Stable. Monitor.  #7 chronic kidney disease stage III Stable.  #8 hypotension Likely secondary to problem #1. Improved with IV fluids and transfusion. Antihypertensive medications on hold. Follow. Resolved. NSL fluids.   #9 chronic systolic heart failure 2-D echo from 09/14/2012 with a EF of 30% with severe LVH. Patient was initially hypotensive on admission and as such diuretics and beta blocker and ACE inhibitor on hold.  Resumed Coreg at half home dose at 3.125 mg twice a day. If BP stable, will resume lasix today.   #10 lung mass Concern for cancerous lesion and probable metastases.  Patient has been seen in consultation by pulmonary and family is leaning towards conservative measures. Palliative care consultation has assessed the patient and goal at this time is to treat the reversible conditions, symptomatic management, no heroic measures, home with home health versus hospice. DO NOT  RESUSCITATE. No further treatment for lung mass. Pulmonary following and appreciate input and recommendations.  #11 Hypertension Patient noted to be hypotensive on admission and as such antihypertensive medications have been held. BP improving. Follow.  #12 prophylaxis PPI for GI prophylaxis. SCDs for DVT prophylaxis.   Code Status: Full Family Communication: Updated patient and daughter at bedside. Disposition Plan: PT evaluation. Needs SNF   Consultants:  Gastroenterology: Dr Gala Romney 10/18/2015  Pulmonary Hawkins 10/18/2015  Palliative care: Dr.Anwar 10/19/2015  Procedures:  2 units packed red blood cells 10/18/2015.  Chest x-ray 10/17/2015  CT chest 10/17/2015    2-D echo 10/18/2015  Antibiotics:  None  HPI/Subjective: He is coughing a lot.  Denies worsening dyspnea.   Objective: Filed Vitals:   10/20/15 2133 10/21/15 0500  BP: 146/79 132/74  Pulse: 118 102  Temp: 98.1 F (36.7 C) 97.9 F (36.6 C)  Resp: 20 20    Intake/Output Summary (Last 24 hours) at 10/21/15 1133 Last data filed at 10/21/15 0800  Gross per 24 hour  Intake    480 ml  Output    425 ml  Net     55 ml   Filed Weights   10/19/15 0500 10/20/15 0500 10/21/15 0500  Weight: 66.8 kg (147 lb 4.3 oz) 70.2 kg (154 lb 12.2 oz) 70.081 kg (154 lb 8 oz)    Exam:  General:  NAD  Cardiovascular: Irregularly irregular  Respiratory: Some scattered coarse BS. No wheezing. . Congested. No crackles.  Abdomen: Soft, nontender, nondistended, positive bowel sounds.  Musculoskeletal: No clubbing cyanosis or edema.  Data Reviewed: Basic Metabolic Panel:  Recent Labs Lab 10/17/15 2050 10/18/15 0901 10/19/15 0509 10/20/15 0439  NA 138 138 138 136  K 4.9 5.1 4.5 4.3  CL 108 109 110 104  CO2 22 21* 19* 23  GLUCOSE 115* 145* 105* 144*  BUN 44* 43* 42* 38*  CREATININE 1.71* 1.66* 1.53* 1.49*  CALCIUM 7.9* 8.0* 8.1* 7.6*  MG  --   --   --  2.0   Liver Function Tests:  Recent Labs Lab  10/18/15 0901  AST 81*  ALT 77*  ALKPHOS 111  BILITOT 1.3*  PROT 5.2*  ALBUMIN 2.8*   No results for input(s): LIPASE, AMYLASE in the last 168 hours. No results for input(s): AMMONIA in the last 168 hours. CBC:  Recent Labs Lab 10/17/15 2050 10/18/15 0901 10/18/15 1540 10/19/15 0509 10/20/15 0439  WBC 4.7  --   --  5.4 6.5  NEUTROABS 3.7  --   --   --   --   HGB 6.5* 9.3* 9.5* 9.2* 9.5*  HCT 22.6* 30.5* 31.5* 30.4* 31.7*  MCV 79.6  --   --  79.8 80.3  PLT 134*  --   --  122* 150   Cardiac Enzymes:  Recent Labs Lab 10/17/15 2050 10/18/15 0901 10/18/15 1540  TROPONINI 0.13*  0.13* 0.08* 0.09*   BNP (last 3 results) No results for input(s): BNP in the last 8760 hours.  ProBNP (last 3 results) No results for input(s): PROBNP in the last 8760 hours.  CBG:  Recent Labs Lab 10/18/15 0803 10/19/15 0802 10/21/15 0709  GLUCAP 101* 103* 129*    Recent Results (from the past 240 hour(s))  Culture, blood (Routine X 2) w Reflex to ID Panel     Status: None (Preliminary result)   Collection Time: 10/17/15  8:50 PM  Result Value Ref Range Status   Specimen Description BLOOD RIGHT ARM DRAWN BY RN  Final   Special Requests BOTTLES DRAWN AEROBIC AND ANAEROBIC 6CC  Final   Culture NO GROWTH 4 DAYS  Final   Report Status PENDING  Incomplete  Culture, blood (Routine X 2) w Reflex to ID Panel     Status: None (Preliminary result)   Collection Time: 10/17/15  8:57 PM  Result Value Ref Range Status   Specimen Description BLOOD RIGHT ANTECUBITAL  Final   Special Requests BOTTLES DRAWN AEROBIC AND ANAEROBIC 6CC EACH  Final   Culture NO GROWTH 4 DAYS  Final   Report Status PENDING  Incomplete  MRSA PCR Screening     Status: None   Collection Time: 10/17/15 11:30 PM  Result Value Ref Range Status   MRSA by PCR NEGATIVE NEGATIVE Final    Comment:        The GeneXpert MRSA Assay (FDA approved for NASAL specimens only), is one component of a comprehensive MRSA  colonization surveillance program. It is not intended to diagnose MRSA infection nor to guide or monitor treatment for MRSA infections.   Urine culture     Status: None   Collection Time: 10/18/15  3:00 AM  Result Value Ref Range Status   Specimen Description URINE, CLEAN CATCH  Final   Special Requests NONE  Final   Culture   Final    NO GROWTH 1 DAY Performed at  [facility]    Report Status 10/19/2015 FINAL  Final     Studies: No results found.  Scheduled Meds: . allopurinol  100 mg Oral QHS  . amoxicillin-clavulanate  1 tablet Oral Q12H  . arformoterol  15 mcg Nebulization BID  . budesonide (PULMICORT) nebulizer solution  0.25 mg Nebulization BID  . carvedilol  3.125 mg Oral BID  . donepezil  10 mg Oral QHS  . ferrous sulfate  325 mg Oral BID  . fluticasone  2 spray Each Nare Daily  . guaiFENesin  1,200 mg Oral BID  . haloperidol lactate  2 mg Intravenous Once  . ipratropium  0.5 mg Nebulization Q6H  . levalbuterol  0.63 mg Nebulization Q6H  . loratadine  10 mg Oral Daily  . oseltamivir  30 mg Oral BID  . pantoprazole (PROTONIX) IV  40 mg Intravenous Q12H  . vitamin B-6  50 mg Oral Daily  . simvastatin  40 mg Oral QHS  . sodium chloride flush  3 mL Intravenous Q12H  . tamsulosin  0.4 mg Oral Daily  . vitamin C  250 mg Oral Daily   Continuous Infusions:    Principal Problem:   Symptomatic anemia Active Problems:   Essential hypertension, benign   Mixed hyperlipidemia   Chronic systolic heart failure (HCC)   CKD (chronic kidney disease) stage 3, GFR 30-59 ml/min   Ischemic cardiomyopathy   Thrombocytopenia, unspecified (HCC)   Atrial fibrillation (HCC)   GI bleed   Lung mass   Anemia   Elevated troponin   Acute blood loss anemia   Cough   Arterial hypotension   Upper GI bleeding   Melena   Influenza A   Palliative care encounter   DNR (do not resuscitate) discussion    Time spent: 40 mins    Niel Hummer A MD [facility] Pager [phone]. If 7PM-7AM, please contact night-coverage at [email or web address], password [facility] 10/21/2015, 11:33 AM  LOS: 4 days

## 2015-10-21 NOTE — Care Management Note (Signed)
Case Management Note  Patient Details  Name: IVER MIKLAS MRN: [id number] Date of Birth: 20-May-1926  Subjective/Objective:      Discussed care with Jolene Provost who was CM for patient yesterday. Patient will need Home with hospice and family has chosen to go with [facility]. Contacted hospice and spoke with Larena Glassman who will take referral   Faxed documents and notes.  I gave daughters contact information to Willey Blade [phone].          Action/Plan: Home with Hospice.    Expected Discharge Date:                  Expected Discharge Plan:  Home w Hospice Care  In-House Referral:  NA  Discharge planning Services  CM Consult  Post Acute Care Choice:  Hospice Choice offered to:  Adult Children  DME Arranged:    DME Agency:     HH Arranged:  RN [place] Agency:  [facility]  Status of Service:  In process, will continue to follow  Medicare Important Message Given:    Date Medicare IM Given:    Medicare IM give by:    Date Additional Medicare IM Given:    Additional Medicare Important Message give by:     If discussed at [place] of Stay Meetings, dates discussed:    Additional Comments:  Alvie Heidelberg, RN 10/21/2015, 9:58 AM

## 2015-10-21 NOTE — Care Management (Signed)
Case discussed with Quinn Axe who has spoken with Romana Juniper daughter and relayed the following information to me. Daughter has discussed information with other family members and discharge plan would be changing from one of home with hospice to rehab facility.  Social worker Jarrett Soho informed of this and will be working patient up. Patient will need PT evaluation. More to follow

## 2015-10-21 NOTE — NC FL2 (Signed)
[place] LEVEL OF CARE SCREENING TOOL     IDENTIFICATION  Patient Name: Evan Melendez Birthdate: 31-Aug-1925 Sex: male Admission Date (Current Location): 10/17/2015  [facility] and [place] Number:  [employer] and Address:  [place] [address] [place]      Provider Number: [id number]  Attending Physician Name and Address:  Elmarie Shiley, MD  Relative Name and Phone Number:   [name] (daughter  [phone])    Current Level of Care: Hospital Recommended Level of Care: [place] Prior Approval Number:    Date Approved/Denied:   PASRR Number:  [id number] SS # [id number]  Discharge Plan: SNF    Current Diagnoses: Patient Active Problem List   Diagnosis Date Noted  . Palliative care encounter   . DNR (do not resuscitate) discussion   . Elevated troponin 10/18/2015  . Acute blood loss anemia 10/18/2015  . Cough 10/18/2015  . Influenza A 10/18/2015  . Arterial hypotension   . Upper GI bleeding   . Melena   . GI bleed 10/17/2015  . Symptomatic anemia 10/17/2015  . Lung mass 10/17/2015  . Anemia 10/17/2015  . Absolute anemia   . Weakness   . Compliance with medication regimen 08/13/2015  . Atrial fibrillation ([place]) 08/13/2015  . Premature atrial contractions 05/08/2014  . Bradycardia 12/19/2013  . Rectal discomfort 09/23/2013  . Pain, low back 06/02/2013  . Short-term memory loss 04/30/2013  . Prostate hypertrophy 04/02/2013  . Thrombocytopenia, unspecified ([place]) 02/04/2013  . Ischemic cardiomyopathy 11/26/2012  . CKD (chronic kidney disease) stage 3, GFR 30-59 ml/min 11/12/2012  . Chronic systolic heart failure ([place]) 11/11/2012  . Essential hypertension, benign 10/17/2012  . Mixed hyperlipidemia 10/17/2012  . Hx of CABG 2004 10/17/2012    Orientation RESPIRATION BLADDER Height & Weight     Self, Situation, Place  O2 (2L) Incontinent Weight: 154 lb 8 oz (70.081  kg) Height:  '5\' 7"'$  (170.2 cm)  BEHAVIORAL SYMPTOMS/MOOD NEUROLOGICAL BOWEL NUTRITION STATUS   none  stable Continent Diet (regular)  AMBULATORY STATUS COMMUNICATION OF NEEDS Skin   Extensive Assist Verbally Normal                       Personal Care Assistance Level of Assistance  Bathing, Feeding, Dressing Bathing Assistance: Limited assistance Feeding assistance: Limited assistance Dressing Assistance: Limited assistance     Functional Limitations Info  Sight, Hearing, Speech Sight Info: Impaired Hearing Info: Impaired Speech Info: Adequate    SPECIAL CARE FACTORS FREQUENCY  PT (By licensed PT), OT (By licensed OT)     PT Frequency: 5 OT Frequency: 5            Contractures Contractures Info: Not present    Additional Factors Info  Code Status, Allergies, Psychotropic, Insulin Sliding Scale, Isolation Precautions Code Status Info: DNR Allergies Info: Quinidine Psychotropic Info: none Insulin Sliding Scale Info: none Isolation Precautions Info: Droplet (flu positive) being treated     Current Medications (10/21/2015):  This is the current hospital active medication list Current Facility-Administered Medications  Medication Dose Route Frequency Provider Last Rate Last Dose  . acetaminophen (TYLENOL) tablet 650 mg  650 mg Oral Q6H PRN Vianne Bulls, MD   650 mg at 10/19/15 1333   Or  . acetaminophen (TYLENOL) suppository 650 mg  650 mg Rectal Q6H PRN Vianne Bulls, MD      . allopurinol (ZYLOPRIM) tablet 100 mg  100 mg  Oral QHS Vianne Bulls, MD   100 mg at 10/20/15 2204  . amoxicillin-clavulanate (AUGMENTIN) 875-125 MG per tablet 1 tablet  1 tablet Oral Q12H Eugenie Filler, MD   1 tablet at 10/21/15 1019  . arformoterol (BROVANA) nebulizer solution 15 mcg  15 mcg Nebulization BID Eugenie Filler, MD   15 mcg at 10/21/15 0831  . budesonide (PULMICORT) nebulizer solution 0.25 mg  0.25 mg Nebulization BID Eugenie Filler, MD   0.25 mg at 10/21/15 0824   . carvedilol (COREG) tablet 3.125 mg  3.125 mg Oral BID Eugenie Filler, MD   3.125 mg at 10/21/15 1020  . donepezil (ARICEPT) tablet 10 mg  10 mg Oral QHS Vianne Bulls, MD   10 mg at 10/20/15 2204  . ferrous sulfate tablet 325 mg  325 mg Oral BID Vianne Bulls, MD   325 mg at 10/21/15 1021  . fluticasone (FLONASE) 50 MCG/ACT nasal spray 2 spray  2 spray Each Nare Daily Irine Seal V, MD   2 spray at 10/20/15 1001  . furosemide (LASIX) tablet 20 mg  20 mg Oral Daily Belkys A Regalado, MD      . guaiFENesin (MUCINEX) 12 hr tablet 1,200 mg  1,200 mg Oral BID Irine Seal V, MD   1,200 mg at 10/21/15 1021  . haloperidol lactate (HALDOL) injection 2 mg  2 mg Intravenous Once Gardiner Barefoot, NP      . HYDROcodone-acetaminophen (NORCO/VICODIN) 5-325 MG per tablet 1-2 tablet  1-2 tablet Oral Q4H PRN Vianne Bulls, MD      . HYDROcodone-homatropine (HYCODAN) 5-1.5 MG/5ML syrup 5 mL  5 mL Oral Q4H PRN Irine Seal V, MD   5 mL at 10/21/15 1130  . ipratropium (ATROVENT) nebulizer solution 0.5 mg  0.5 mg Nebulization Q6H Eugenie Filler, MD   0.5 mg at 10/21/15 0811  . ipratropium (ATROVENT) nebulizer solution 0.5 mg  0.5 mg Nebulization Q2H PRN Eugenie Filler, MD      . levalbuterol [facility]) nebulizer solution 0.63 mg  0.63 mg Nebulization Q6H Eugenie Filler, MD   0.63 mg at 10/21/15 0811  . levalbuterol (XOPENEX) nebulizer solution 0.63 mg  0.63 mg Nebulization Q2H PRN Eugenie Filler, MD      . loratadine (CLARITIN) tablet 10 mg  10 mg Oral Daily Eugenie Filler, MD   10 mg at 10/21/15 1021  . ondansetron (ZOFRAN) tablet 4 mg  4 mg Oral Q6H PRN Vianne Bulls, MD       Or  . ondansetron (ZOFRAN) injection 4 mg  4 mg Intravenous Q6H PRN Vianne Bulls, MD      . oseltamivir (TAMIFLU) capsule 30 mg  30 mg Oral BID Eugenie Filler, MD   30 mg at 10/21/15 1020  . pantoprazole (PROTONIX) injection 40 mg  40 mg Intravenous Q12H Orvil Feil, NP   40 mg at 10/21/15 1020  .  pyridOXINE (VITAMIN B-6) tablet 50 mg  50 mg Oral Daily Vianne Bulls, MD   50 mg at 10/21/15 1021  . simvastatin (ZOCOR) tablet 40 mg  40 mg Oral QHS Vianne Bulls, MD   40 mg at 10/20/15 2149  . sodium chloride flush (NS) 0.9 % injection 3 mL  3 mL Intravenous Q12H Ilene Qua Opyd, MD   3 mL at 10/21/15 1021  . tamsulosin (FLOMAX) capsule 0.4 mg  0.4 mg Oral Daily Vianne Bulls, MD   0.4  mg at 10/21/15 1021  . vitamin C (ASCORBIC ACID) tablet 250 mg  250 mg Oral Daily Irine Seal V, MD   250 mg at 10/21/15 1019  . zolpidem (AMBIEN) tablet 5 mg  5 mg Oral QHS PRN Vianne Bulls, MD   5 mg at 10/20/15 2149     Discharge Medications: Please see discharge summary for a list of discharge medications.  Relevant Imaging Results:  Relevant Lab Results:   Additional Information    Lilly Cove, LCSW

## 2015-10-21 NOTE — Clinical Social Work Placement (Addendum)
   CLINICAL SOCIAL WORK PLACEMENT  NOTE  Date:  10/21/2015  Patient Details  Name: Evan Melendez MRN: [id number] Date of Birth: 07-27-1925  Clinical Social Work is seeking post-discharge placement for this patient at the [place] level of care (*CSW will initial, date and re-position this form in  chart as items are completed):  Yes   Patient/family provided with [place] Work Department's list of facilities offering this level of care within the geographic area requested by the patient (or if unable, by the patient's family).  Yes   Patient/family informed of their freedom to choose among providers that offer the needed level of care, that participate in Medicare, Medicaid or managed care program needed by the patient, have an available bed and are willing to accept the patient.  Yes   Patient/family informed of [place]'s ownership interest in [facility] and [facility], as well as of the fact that they are under no obligation to receive care at these facilities.  PASRR submitted to EDS on 10/21/15     PASRR number received on 10/21/15     Existing PASRR number confirmed on 10/21/15     FL2 transmitted to all facilities in geographic area requested by pt/family on 10/21/15     FL2 transmitted to all facilities within larger geographic area on       Patient informed that his/her managed care company has contracts with or will negotiate with certain facilities, including the following:            Patient/family informed of bed offers received. Yes  Patient chooses bed at    [place] recommends and patient chooses bed at      SNF  Patient to be transferred to   on  . Today  10/22/2015       Patient to be transferred to facility by     EMS  Patient family notified on   of transfer.  Son at bedside  Name of family member notified:      And Son.   PHYSICIAN Please sign FL2, Please sign DNR      Additional Comment:     _______________________________________________ Lilly Cove, LCSW 10/21/2015, 11:51 AM

## 2015-10-21 NOTE — Progress Notes (Signed)
OT Cancellation Note  Patient Details Name: LORAS GRIESHOP MRN: [id number] DOB: 11/12/1925   Cancelled Treatment:     Reason evaluation not completed: Chart reviewed, pt family has elected to pursue hospice services on discharge. Pt will be discharging with [facility], and will have supervision 24/7. No further OT services required at this time.   Guadelupe Sabin, OTR/L  [phone]  10/21/2015, 8:18 AM

## 2015-10-21 NOTE — Progress Notes (Signed)
I am following "socially". Not actively involved in his medical treatment. I'm trying to provide support to his family

## 2015-10-21 NOTE — Progress Notes (Signed)
Daily Progress Note   Patient Name: Evan Melendez       Date: 10/21/2015 DOB: 05-18-1926  Age: 82 y.o. MRN#: [id number] Attending Physician: Elmarie Shiley, MD Primary Care Physician: Mickie Hillier, MD Admit Date: 10/17/2015  Reason for Consultation/Follow-up: Disposition, Establishing goals of care and Psychosocial/spiritual support  Subjective: Mr. Tabar is resting quietly in bed. Nursing staff at bedside. He asks, "did you find out what's wrong with me?".  I share that he has the flu, and that he will likely be weak for at least a month. He states, "I'll need an excuse for work".  I tell him that we will be happy to write this excuse for him. After about 20 or 30 seconds he states, "oh, I'm retired".  Meeting with daughter [name] in the waiting room. [name] shares that she and her brother had prepared themselves for their father's decline with dementia, and that this new diagnosis of lung cancer has been difficult for them. She states that she was " stuck" yesterday on the idea of never sending him to a skilled nursing facility.  [name] states that she is working with hospice of [facility] on hospice services at home. But that she would like to have a trial of rehab if he qualifies. The goal is to improve strength and mobility. We talk about how the process works for rehab placement, including physical therapy evaluation and recommendation, and being accepted in a skilled nursing facility. She shares that her preference is the [facility]. I share that a person cannot have both rehab and hospice at the same time, and she agrees to continue working with hospice after he is released from rehab.  Length of Stay: 4 days  Current Medications: Scheduled Meds:  .  allopurinol  100 mg Oral QHS  . amoxicillin-clavulanate  1 tablet Oral Q12H  . arformoterol  15 mcg Nebulization BID  . budesonide (PULMICORT) nebulizer solution  0.25 mg Nebulization BID  . carvedilol  3.125 mg Oral BID  . donepezil  10 mg Oral QHS  . ferrous sulfate  325 mg Oral BID  . fluticasone  2 spray Each Nare Daily  . furosemide  20 mg Oral Daily  . guaiFENesin  1,200 mg Oral BID  . haloperidol lactate  2 mg Intravenous Once  .  ipratropium  0.5 mg Nebulization Q6H  . levalbuterol  0.63 mg Nebulization Q6H  . loratadine  10 mg Oral Daily  . oseltamivir  30 mg Oral BID  . pantoprazole (PROTONIX) IV  40 mg Intravenous Q12H  . vitamin B-6  50 mg Oral Daily  . simvastatin  40 mg Oral QHS  . sodium chloride flush  3 mL Intravenous Q12H  . tamsulosin  0.4 mg Oral Daily  . vitamin C  250 mg Oral Daily    Continuous Infusions:    PRN Meds: acetaminophen **OR** acetaminophen, HYDROcodone-acetaminophen, HYDROcodone-homatropine, ipratropium, levalbuterol, ondansetron **OR** ondansetron (ZOFRAN) IV, zolpidem  Physical Exam: Physical Exam  Constitutional: No distress.  Cardiovascular: Normal rate and regular rhythm.   Pulmonary/Chest: Effort normal. No respiratory distress.  Abdominal: Soft. Bowel sounds are normal. He exhibits no distension. There is no guarding.  Neurological: He is alert.  Pleasantly confused.   Skin: Skin is warm and dry.  Nursing note and vitals reviewed.               Vital Signs: BP 132/74 mmHg  Pulse 102  Temp(Src) 97.9 F (36.6 C) (Oral)  Resp 20  Ht '5\' 7"'$  (1.702 m)  Wt 70.081 kg (154 lb 8 oz)  BMI 24.19 kg/m2  SpO2 96% SpO2: SpO2: 96 % O2 Device: O2 Device:  (nebulizer) O2 Flow Rate: O2 Flow Rate (L/min): 2 L/min  Intake/output summary:  Intake/Output Summary (Last 24 hours) at 10/21/15 1323 Last data filed at 10/21/15 0800  Gross per 24 hour  Intake    480 ml  Output    425 ml  Net     55 ml   LBM: Last BM Date: 10/17/15 Baseline  Weight: Weight: 66.225 kg (146 lb) Most recent weight: Weight: 70.081 kg (154 lb 8 oz)       Palliative Assessment/Data: Flowsheet Rows        Most Recent Value   Intake Tab    Referral Department  Pulmonary   Unit at Time of Referral  ICU   Palliative Care Primary Diagnosis  Pulmonary   Date Notified  10/19/15   Palliative Care Type  New Palliative care   Reason for referral  Clarify Goals of Care   Date of Admission  10/17/15   Date first seen by Palliative Care  10/19/15   # of days Palliative referral response time  0 Day(s)   # of days IP prior to Palliative referral  2   Clinical Assessment    Palliative Performance Scale Score  40%   Pain Max last 24 hours  Not able to report   Pain Min Last 24 hours  Not able to report   Dyspnea Max Last 24 Hours  Not able to report   Dyspnea Min Last 24 hours  Not able to report   Psychosocial & Spiritual Assessment    Palliative Care Outcomes    Patient/Family meeting held?  Yes   Who was at the meeting?  Daughter [name]   Palliative Care Outcomes  Clarified goals of care, Provided end of life care assistance, Provided advance care planning, Provided psychosocial or spiritual support, Changed CPR status, Completed durable DNR   Patient/Family wishes: Interventions discontinued/not started   Mechanical Ventilation   Palliative Care follow-up planned  -- [Follow-up at [facility]]      Additional Data Reviewed: CBC    Component Value Date/Time   WBC 6.5 10/20/2015 0439   RBC 3.95* 10/20/2015 0439   RBC 2.83*  10/17/2015 2050   HGB 9.5* 10/20/2015 0439   HCT 31.7* 10/20/2015 0439   PLT 150 10/20/2015 0439   MCV 80.3 10/20/2015 0439   MCH 24.1* 10/20/2015 0439   MCHC 30.0 10/20/2015 0439   RDW 17.4* 10/20/2015 0439   LYMPHSABS 0.6* 10/17/2015 2050   MONOABS 0.4 10/17/2015 2050   EOSABS 0.0 10/17/2015 2050   BASOSABS 0.0 10/17/2015 2050    CMP     Component Value Date/Time   NA 136 10/20/2015 0439   NA 143 09/14/2015  1423   K 4.3 10/20/2015 0439   CL 104 10/20/2015 0439   CO2 23 10/20/2015 0439   GLUCOSE 144* 10/20/2015 0439   GLUCOSE 86 09/14/2015 1423   BUN 38* 10/20/2015 0439   BUN 34* 09/14/2015 1423   CREATININE 1.49* 10/20/2015 0439   CREATININE 1.52* 09/10/2014 0818   CALCIUM 7.6* 10/20/2015 0439   PROT 5.2* 10/18/2015 0901   PROT 5.6* 03/18/2015 0808   ALBUMIN 2.8* 10/18/2015 0901   ALBUMIN 3.7 03/18/2015 0808   AST 81* 10/18/2015 0901   ALT 77* 10/18/2015 0901   ALKPHOS 111 10/18/2015 0901   BILITOT 1.3* 10/18/2015 0901   BILITOT 0.3 03/18/2015 0808   GFRNONAA 40* 10/20/2015 0439   GFRAA 47* 10/20/2015 0439       Problem List:  Patient Active Problem List   Diagnosis Date Noted  . Palliative care encounter   . DNR (do not resuscitate) discussion   . Elevated troponin 10/18/2015  . Acute blood loss anemia 10/18/2015  . Cough 10/18/2015  . Influenza A 10/18/2015  . Arterial hypotension   . Upper GI bleeding   . Melena   . GI bleed 10/17/2015  . Symptomatic anemia 10/17/2015  . Lung mass 10/17/2015  . Anemia 10/17/2015  . Absolute anemia   . Weakness   . Compliance with medication regimen 08/13/2015  . Atrial fibrillation ([place]) 08/13/2015  . Premature atrial contractions 05/08/2014  . Bradycardia 12/19/2013  . Rectal discomfort 09/23/2013  . Pain, low back 06/02/2013  . Short-term memory loss 04/30/2013  . Prostate hypertrophy 04/02/2013  . Thrombocytopenia, unspecified ([place]) 02/04/2013  . Ischemic cardiomyopathy 11/26/2012  . CKD (chronic kidney disease) stage 3, GFR 30-59 ml/min 11/12/2012  . Chronic systolic heart failure ([place]) 11/11/2012  . Essential hypertension, benign 10/17/2012  . Mixed hyperlipidemia 10/17/2012  . Hx of CABG 2004 10/17/2012     Palliative Care Assessment & Plan    1.Code Status:  DNR    Code Status Orders        Start     Ordered   10/19/15 1715  Do not attempt resuscitation (DNR)   Continuous    Question Answer Comment    In the event of cardiac or respiratory ARREST Do not call a "code blue"   In the event of cardiac or respiratory ARREST Do not perform Intubation, CPR, defibrillation or ACLS   In the event of cardiac or respiratory ARREST Use medication by any route, position, wound care, and other measures to relive pain and suffering. May use oxygen, suction and manual treatment of airway obstruction as needed for comfort.      10/19/15 1714    Code Status History    Date Active Date Inactive Code Status Order ID Comments User Context   10/17/2015 10:22 PM 10/19/2015  5:14 PM Full Code [id number]  Vianne Bulls, MD ED   03/29/2013 11:26 PM 03/30/2013  6:16 PM Full Code [id number]  Karlyn Agee, MD Inpatient  11/11/2012 11:47 AM 11/14/2012  5:21 PM Full Code [id number]  Doree Albee, MD ED       2. Goals of Care/Additional Recommendations:  Rehab for improving strength and mobility if possible.  Limitations on Scope of Treatment: Treat the treatable, rehab for strength, hospice in the future.  Desire for further Chaplaincy support:no  Psycho-social Needs: Caregiving  Support/Resources, Education on Hospice and Grief/Bereavement Support  3. Symptom Management:      1. Per hospitalist.  4. Palliative Prophylaxis:   Aspiration, Oral Care and Turn Reposition  5. Prognosis: < 12 months, likely 6 months or less based on new diagnosis of lung cancer and decision to not seek treatment.  6. Discharge Planning:  [place] for rehab with Palliative care service follow-up   Care plan was discussed with nursing staff, CM, SW, PT and Dr. Tyrell Antonio.   Thank you for allowing the Palliative Medicine Team to assist in the care of this patient.   Time In: 1005 Time Out: 1030 Total Time 25 minutes  Prolonged Time Billed  no         Drue Novel, NP  10/21/2015, 1:23 PM  Please contact Palliative Medicine Team phone at [phone] for questions and concerns.

## 2015-10-21 NOTE — Clinical Social Work Note (Signed)
Clinical Social Work Assessment  Patient Details  Name: AYAN HEFFINGTON MRN: [id number] Date of Birth: 1926/06/16  Date of referral:  10/21/15               Reason for consult:  Facility Placement                Permission sought to share information with:  Case Manager, Customer service manager, Family Supports Permission granted to share information::  Yes, Verbal Permission Granted  Name::        Agency::  SNF bed search  Relationship::  Daughter [name] and brother (both Economist)  Sport and exercise psychologist Information:     Housing/Transportation Living arrangements for the past 2 months:  Single Family Home (home alone with aide) Source of Information:  Patient, Medical Team, Adult Children Patient Interpreter Needed:  None Criminal Activity/Legal Involvement Pertinent to Current Situation/Hospitalization:  No - Comment as needed Significant Relationships:  Adult Children, Warehouse manager, Other Family Members Lives with:  Self Do you feel safe going back to the place where you live?  No (needing ST SNF) Need for family participation in patient care:  Yes (Comment) (daughte and son are HCPOA and help with decisions)  Care giving concerns:  Daughter reports patient is very weak and deconditioned. Reports wanting him to get ST SNF prior to returning home. At home patient lives alone with daughter and son checking in one him weekly. Daughter lives in [place] and son lives in [place]. Patient has a daily aide that assists for 5 hours a day. Plan is to have aide part of ST SNF process to encourage participation.   Social Worker assessment / plan:  LCSW met with patient and daughter at the bedside. Daughter very involved and helpful with assessment. Patient unable to explain why he is in the hospital, but understands he is sick.  Daughter reports patient has dementia and is confused at times. He does sundown and can be angry one minute and rebound quickly the next minute.  Daughter agreeable  to SNF and brother has been part of process and decision making. First choice is [facility] and permission given to sent clinicals. Awaiting review and will follow up. FL2 complete [place] completed.   Employment status:  Retired Forensic scientist:  Medicare PT Recommendations:  Not assessed at this time (order placed for PT) Information / Referral to community resources:  [place]  Patient/Family's Response to care:  Agreeable to plan  Patient/Family's Understanding of and Emotional Response to Diagnosis, Current Treatment, and Prognosis:  All parties involved in patient care are very knowledgeable and aware of his current condition and needs.  LCSW and daughter process how to communicate ST SNF at DC with patient as he does not want to be in a nursing home. Daughter reports she does not want to lie to him, but needs him to go to [facility] SNF to get stronger then return home.  Emotional Assessment Appearance:  Appears stated age Attitude/Demeanor/Rapport:  Other (cooperative and talkative, at times confused) Affect (typically observed):  Accepting, Adaptable Orientation:  Oriented to Self, Oriented to Place Alcohol / Substance use:  Not Applicable Psych involvement (Current and /or in the community):  No (Comment)  Discharge Needs  Concerns to be addressed:  No discharge needs identified Readmission within the last 30 days:  No Current discharge risk:  None Barriers to Discharge:  No Barriers Identified, Continued Medical Work up   [place], LCSW 10/21/2015, 1:41 PM

## 2015-10-21 NOTE — Progress Notes (Signed)
PT Cancellation Note  Patient Details Name: JAQUA CHING MRN: [id number] DOB: [birth year]   Cancelled Treatment:    Reason Eval/Treat Not Completed: Other (comment). Pt/family have updated plan to DC to home with Hospice services. No further PT services needed in this venue of care. I will sign off and compete the order.    9:14 AM, 10/21/2015 Etta Grandchild, PT, DPT PRN Physical Therapist at [place] License # [id number] [phone] (wireless)  [phone] (mobile)

## 2015-10-22 LAB — HEMOGLOBIN AND HEMATOCRIT, BLOOD
HEMATOCRIT: 32.5 % — AB (ref 39.0–52.0)
Hemoglobin: 9.5 g/dL — ABNORMAL LOW (ref 13.0–17.0)

## 2015-10-22 LAB — BASIC METABOLIC PANEL WITH GFR
Anion gap: 9 (ref 5–15)
BUN: 44 mg/dL — ABNORMAL HIGH (ref 6–20)
CO2: 28 mmol/L (ref 22–32)
Calcium: 7.8 mg/dL — ABNORMAL LOW (ref 8.9–10.3)
Chloride: 102 mmol/L (ref 101–111)
Creatinine, Ser: 1.54 mg/dL — ABNORMAL HIGH (ref 0.61–1.24)
GFR calc Af Amer: 45 mL/min — ABNORMAL LOW (ref >60)
GFR calc non Af Amer: 39 mL/min — ABNORMAL LOW (ref >60)
Glucose, Bld: 123 mg/dL — ABNORMAL HIGH (ref 65–99)
Potassium: 4 mmol/L (ref 3.5–5.1)
Sodium: 139 mmol/L (ref 135–145)

## 2015-10-22 LAB — CULTURE, BLOOD (ROUTINE X 2)
Culture: NO GROWTH
Culture: NO GROWTH

## 2015-10-22 LAB — GLUCOSE, CAPILLARY: Glucose-Capillary: 116 mg/dL — ABNORMAL HIGH (ref 65–99)

## 2015-10-22 MED ORDER — LEVALBUTEROL HCL 0.63 MG/3ML IN NEBU: 0.6300 mg | INHALATION_SOLUTION | Freq: Four times a day (QID) | RESPIRATORY_TRACT | Status: DC

## 2015-10-22 MED ORDER — AMOXICILLIN-POT CLAVULANATE 875-125 MG PO TABS: 1.0000 | ORAL_TABLET | Freq: Two times a day (BID) | ORAL | Status: DC

## 2015-10-22 MED ORDER — FUROSEMIDE 20 MG PO TABS
20.0000 mg | ORAL_TABLET | Freq: Every day | ORAL | Status: AC
Start: 2015-10-22 — End: ?

## 2015-10-22 MED ORDER — OSELTAMIVIR PHOSPHATE 30 MG PO CAPS: 30.0000 mg | ORAL_CAPSULE | Freq: Two times a day (BID) | ORAL | Status: DC

## 2015-10-22 MED ORDER — GUAIFENESIN ER 600 MG PO TB12: 1200.0000 mg | ORAL_TABLET | Freq: Two times a day (BID) | ORAL | Status: DC

## 2015-10-22 MED ORDER — ARFORMOTEROL TARTRATE 15 MCG/2ML IN NEBU: 15.0000 ug | INHALATION_SOLUTION | Freq: Two times a day (BID) | RESPIRATORY_TRACT | Status: DC

## 2015-10-22 MED ORDER — LORATADINE 10 MG PO TABS: 10.0000 mg | ORAL_TABLET | Freq: Every day | ORAL | Status: DC

## 2015-10-22 MED ORDER — IPRATROPIUM BROMIDE 0.02 % IN SOLN: 0.5000 mg | Freq: Four times a day (QID) | RESPIRATORY_TRACT | Status: DC

## 2015-10-22 MED ORDER — BUDESONIDE 0.25 MG/2ML IN SUSP: 0.2500 mg | Freq: Two times a day (BID) | RESPIRATORY_TRACT | Status: AC

## 2015-10-22 MED ORDER — CARVEDILOL 3.125 MG PO TABS: 6.2500 mg | ORAL_TABLET | Freq: Two times a day (BID) | ORAL | Status: DC

## 2015-10-22 MED ORDER — PANTOPRAZOLE SODIUM 40 MG PO TBEC
40.0000 mg | DELAYED_RELEASE_TABLET | Freq: Two times a day (BID) | ORAL | Status: DC
  Administered 2015-10-22: 40 mg via ORAL
  Filled 2015-10-22: qty 1

## 2015-10-22 MED ORDER — CARVEDILOL 3.125 MG PO TABS
6.2500 mg | ORAL_TABLET | Freq: Two times a day (BID) | ORAL | Status: DC
  Administered 2015-10-22: 6.25 mg via ORAL

## 2015-10-22 MED ORDER — HYDROCODONE-HOMATROPINE 5-1.5 MG/5ML PO SYRP
5.0000 mL | ORAL_SOLUTION | ORAL | Status: AC | PRN
Start: 2015-10-22 — End: ?

## 2015-10-22 MED ORDER — PANTOPRAZOLE SODIUM 40 MG PO TBEC: 40.0000 mg | DELAYED_RELEASE_TABLET | Freq: Two times a day (BID) | ORAL | Status: AC

## 2015-10-22 NOTE — Care Management Important Message (Signed)
Important Message  Patient Details  Name: Evan Melendez MRN: [id number] Date of Birth: [birth year]   Medicare Important Message Given:  Yes    Alvie Heidelberg, RN 10/22/2015, 10:35 AM

## 2015-10-22 NOTE — Progress Notes (Addendum)
3:14 PM: Patient has been accepted to [facility]. Son at bedside, discussed plan and in agreement. Patient will transport by EMS Spoke with facility and patient will go to room B14 bed 2. No other needs at this time.  DC to SNF.   Patient has been declined by [facility]. If stable today, can DC to SNF. Facility [facility] is reviewing case again as more information has been given regarding hospice) LCSW has also faxed to additional facilities with one bed offer at [facility].  Will follow up with treatment team and family. Son would be the one to sign patient in per conversation with daughter on 3/29.  Awaiting [facility] to review notes again and follow up.  Will continue to follow for disposition.  Lane Hacker, MSW Clinical Social Work: Emergency Room [phone]

## 2015-10-22 NOTE — Progress Notes (Signed)
Pt on RA and SATS were 89%. Placed Pt was placed back on 1L [place].

## 2015-10-22 NOTE — Progress Notes (Signed)
Following peripherally. Her family wanted me to tell the patient that he is going to go to a rehabilitation facility and I did. He does not seem to be upset about that. They also have additional questions about whether he would be a candidate for one of the monoclonal antibody treatments and I told him that it would require a biopsy and even with a biopsy he may not have the appropriate cell type and the appropriate mutation but if they want to consider biopsy it can be arranged

## 2015-10-22 NOTE — Progress Notes (Signed)
Report called to Caren Griffins, LPN at [facility]. Waiting for [facility] to arrive to transport pt.

## 2015-10-22 NOTE — Discharge Summary (Signed)
Physician Discharge Summary  Evan Melendez QZR:[id number] DOB: Nov 24, 1925 DOA: 10/17/2015  PCP: Evan Hillier, MD  Admit date: 10/17/2015 Discharge date: 10/22/2015  Time spent: 35 minutes  Recommendations for Outpatient Follow-up:  Follow up with Dr Evan Melendez for further evaluation of lung mass.  Needs B-met to follow renal function.  Consider resuming ACE for heart failure if BP Evan renal functions allows it.   Discharge Diagnoses:    Symptomatic anemia   Influenza A   PNA   Essential Evan Melendez, benign   Mixed hyperlipidemia   Evan systolic heart failure (HCC)   CKD (Evan kidney disease) stage 3, GFR 30-59 ml/min   Ischemic cardiomyopathy   Thrombocytopenia, unspecified (HCC)   Atrial Melendez (HCC)   GI bleed   Lung mass   Anemia   Elevated troponin   Acute blood loss anemia   Melendez   Arterial hypotension   Upper GI bleeding   Melena   Palliative care encounter   DNR (do not resuscitate) discussion   Discharge Condition: Stable  Diet recommendation: heart healthy  Filed Weights   10/20/15 0500 10/21/15 0500 10/22/15 0500  Weight: 70.2 kg (154 lb 12.2 oz) 70.081 kg (154 lb 8 oz) 69.899 kg (154 lb 1.6 oz)    History of present illness:  Brief interval history HPI: Evan Melendez is a 82 y.o. male with PMH of Melendez, Evan Melendez, Evan Melendez, Evan Melendez, Evan Melendez, Evan Melendez, Evan fatigue. Patient lives alone but has a caretaker visits during the day. He had been noted to be much less active the past 2 days Evan upon questioning, he endorsed a nonproductive Melendez, Evan Melendez, Evan fatigue. He denied any pain, fevers, nausea, vomiting, or diarrhea. He denied any dysuria. Up until 2 days ago, patient had been in his usual state of health Evan fairly active throughout the day. Patient denies any recent fall or trauma. He admits to using his medications  inconsistently.  In ED, patient was found to be afebrile, saturating adequately on room air, initially with good blood pressure, Evan then with BP persisting in the 85/50 range. EKG featured atrial Melendez with T-wave inversions in the anterolateral leads. Chest x-ray featured a 7.7 cm mass in the mid left lung Evan this was followed by noncontrast chest CT. On chest CT, a 7.8 cm mass was identified in the posterior left upper lobe with a 11 mm satellite nodule in the left lower lobe as well as mediastinal lymphadenopathy. Blood work returned most notable for a hemoglobin of 6.5 Evan DRE produced melanotic stool that was FOBT positive. Patient was bolused with 1 L of normal saline Evan 2 units of packed red blood cells were ordered for immediate transfusion. EDP discussed the CT findings with the patient Evan his daughter. Blood pressure remained on the low side, but the patient remained asymptomatic. He'll be admitted to the stepdown unit for ongoing evaluation Evan management of GI bleed with symptomatic anemia.  Patient was admitted placed in the step down unit as he was having borderline hypotension Evan transfuse 2 units packed red blood cells. Patient had serial H&H's done then hemoglobin sent to stabilize. GI was consulted Evan recommended conservative measures at this time. Patient was also noted to have a lung mass on CT chest Evan chest x-ray Evan a such pulmonary consultation was obtained. Pulmonary met with the family Evan decision was made for conservative measures Evan as such palliative care consultation  was obtained. Patient was also noted to have a rhonchorous Melendez which she had initially presented with. Workup was negative for pneumonia. Patient was noted to be positive for influenza a.m. placed on Tamiflu. Patient was also noted to have some wheezing received a dose of IV Solu-Medrol is currently on scheduled nebulizer treatments Evan Mucinex. Supportive care.   Assessment/Plan:  Hospital  Course:  Evan Cory, MD Physician Signed Internal Medicine Progress Notes 10/21/2015 11:33 AM    Expand All Collapse All   [facility] PROGRESS NOTE  Evan Melendez [id number]:[id number] DOB: 1925-08-14 DOA: 10/17/2015 PCP: Evan South, MD  Brief interval history HPI: Evan Melendez is a 82 y.o. male with PMH of Melendez, Evan Melendez, Evan Melendez, Evan Melendez, Evan Melendez, Evan Melendez, Evan fatigue. Patient lives alone but has a caretaker visits during the day. He had been noted to be much less active the past 2 days Evan upon questioning, he endorsed a nonproductive Melendez, Evan Melendez, Evan fatigue. He denied any pain, fevers, nausea, vomiting, or diarrhea. He denied any dysuria. Up until 2 days ago, patient had been in his usual state of health Evan fairly active throughout the day. Patient denies any recent fall or trauma. He admits to using his medications inconsistently.  In ED, patient was found to be afebrile, saturating adequately on room air, initially with good blood pressure, Evan then with BP persisting in the 85/50 range. EKG featured atrial Melendez with T-wave inversions in the anterolateral leads. Chest x-ray featured a 7.7 cm mass in the mid left lung Evan this was followed by noncontrast chest CT. On chest CT, a 7.8 cm mass was identified in the posterior left upper lobe with a 11 mm satellite nodule in the left lower lobe as well as mediastinal lymphadenopathy. Blood work returned most notable for a hemoglobin of 6.5 Evan DRE produced melanotic stool that was FOBT positive. Patient was bolused with 1 L of normal saline Evan 2 units of packed red blood cells were ordered for immediate transfusion. EDP discussed the CT findings with the patient Evan his daughter. Blood pressure remained on the low side, but the patient remained asymptomatic. He'll be admitted to the stepdown unit for ongoing  evaluation Evan management of GI bleed with symptomatic anemia.  Patient was admitted placed in the step down unit as he was having borderline hypotension Evan transfuse 2 units packed red blood cells. Patient had serial H&H's done then hemoglobin sent to stabilize. GI was consulted Evan recommended conservative measures at this time. Patient was also noted to have a lung mass on CT chest Evan chest x-ray Evan a such pulmonary consultation was obtained. Pulmonary met with the family Evan decision was made for conservative measures Evan as such palliative care consultation was obtained. Patient was also noted to have a rhonchorous Melendez which she had initially presented with. Workup was negative for pneumonia. Patient was noted to be positive for influenza a.m. placed on Tamiflu. Patient was also noted to have some wheezing received a dose of IV Solu-Medrol is currently on scheduled nebulizer treatments Evan Mucinex. Supportive care.      Assessment/Plan: 1 symptomatic anemia/probable GI bleed Patient had presented with generalized Evan Melendez Evan fatigue Evan a Melendez. Patient noted admission to have a hemoglobin of 6.5. Digital rectal exam done noted melanotic stool. FOBT was positive. Patient denies any NSAID use however is on aspirin daily. Patient denies any overt melanotic bleeding.  Patient was given Protonix 80 mg IV push 1. Patient was on a Protonix drip which has been transitioned to oral Protonix. GI. Patient status post 2 units packed red blood cells with hemoglobin currently at 9.5. GI following Evan recommended no endoscopic evaluation at this time unless overt GI bleed. GI ff.  -Hb stable. At 9.   #2 acute blood loss anemia Secondary to problem #1. Status post transfusion of 2 units packed red blood cells. H&H stable. Hb stable.   #3 elevated troponin Likely secondary to demand ischemia secondary to problem #1. Elevated troponins seem to have plateaued. Patient denies any chest pain. EKG with A.  fib with borderline T-wave abnormalities. 2-D echo with a EF of 20-25% with diffuse hypokinesis worse in the inferior Evan inferoseptal walls. Septal motion showed abnormal function dyssynergy. Hypokinesis with inferior akinesis seen on prior 2-D echo of 2014 with a EF of 30%. Continue beta blocker. Continue to hold ACE inhibitor secondary to borderline hypotension. Medical management for now as patient with a lung mass likely metastatic in nature Evan family wanting conservative measures .  #4 Melendez/ influenza A Questionable etiology. Chest x-ray Evan CT chest negative for pneumonia however consistent with a lung mass which might be the etiology of his Melendez. Influenza PCR positive for influenza A. Patient has been started on Tamiflu. Discontinue Tessalon Perles Evan place on Mucinex twice daily as patient seems to have a rhonchorous Melendez Evan some wheezing. Given a dose of IV Solu-Medrol. Place on nebulizer treatments. Continue Claritin Evan Flonase . Hycodan as needed. Needs 3 more days of tamiflu.   #5 atrial Melendez CHADS2VASC = 4 Continue with coreg.  Unable to place on anticoagulation at this time due to problem #1.  #6 Evan thrombocytopenia Stable. Monitor.  #7 Evan kidney disease stage III Stable. Baseline 1.6.   #8 hypotension Likely secondary to problem #1. Improved with IV fluids Evan transfusion. Antihypertensive medications on hold. Follow. Resolved. NSL fluids.   #9 Evan systolic heart failure 2-D echo from 09/14/2012 with a EF of 30% with severe LVH. Patient was initially hypotensive on admission Evan as such diuretics Evan beta blocker Evan ACE inhibitor on hold. Resumed Coreg at half home dose at 3.125 mg twice a day. Tolerating lasix. Needs repeat B-met. If renal function stable consider resuming ACE>   #10 lung mass Concern for cancerous lesion Evan probable metastases. Patient has been seen in consultation by pulmonary Evan family is leaning towards  conservative measures. Palliative care consultation has assessed the patient Evan goal at this time is to treat the reversible conditions, symptomatic management, no heroic measures, home with home health versus hospice. DO NOT RESUSCITATE. No further treatment for lung mass. Pulmonary following Evan appreciate input Evan recommendations.  #11 Evan Melendez Patient noted to be hypotensive on admission Evan as such antihypertensive medications have been held. BP improving. Follow.  #12 prophylaxis PPI for GI prophylaxis. SCDs for DVT prophylaxis.   Code Status: Full Family Communication: Updated patient Evan daughter at bedside. Disposition Plan: PT evaluation. Needs SNF   Consultants:  Gastroenterology: Dr Gala Romney 10/18/2015  Pulmonary Hawkins 10/18/2015  Palliative care: Dr.Anwar 10/19/2015  Procedures:  2 units packed red blood cells 10/18/2015.  Chest x-ray 10/17/2015  CT chest 10/17/2015   2-D echo 10/18/2015  Antibiotics:  None  HPI/Subjective: He is coughing a lot.  Denies worsening dyspnea.   Objective: Filed Vitals:   10/20/15 2133 10/21/15 0500  BP: 146/79 132/74  Pulse: 118 102  Temp: 98.1 F (36.7  C) 97.9 F (36.6 C)  Resp: 20 20    Intake/Output Summary (Last 24 hours) at 10/21/15 1133 Last data filed at 10/21/15 0800  Gross per 24 hour  Intake  480 ml  Output  425 ml  Net  55 ml   Filed Weights   10/19/15 0500 10/20/15 0500 10/21/15 0500  Weight: 66.8 kg (147 lb 4.3 oz) 70.2 kg (154 lb 12.2 oz) 70.081 kg (154 lb 8 oz)    Exam:   General: NAD  Cardiovascular: Irregularly irregular  Respiratory: Some scattered coarse BS. No wheezing. . Congested. No crackles.  Abdomen: Soft, nontender, nondistended, positive bowel sounds.  Musculoskeletal: No clubbing cyanosis or edema.  Data Reviewed: Basic Metabolic Panel:  Last Labs      Recent Labs Lab 10/17/15 2050 10/18/15 0901 10/19/15 0509  10/20/15 0439  NA 138 138 138 136  K 4.9 5.1 4.5 4.3  CL 108 109 110 104  CO2 22 21* 19* 23  GLUCOSE 115* 145* 105* 144*  BUN 44* 43* 42* 38*  CREATININE 1.71* 1.66* 1.53* 1.49*  CALCIUM 7.9* 8.0* 8.1* 7.6*  MG --  --  --  2.0     Liver Function Tests:  Last Labs      Recent Labs Lab 10/18/15 0901  AST 81*  ALT 77*  ALKPHOS 111  BILITOT 1.3*  PROT 5.2*  ALBUMIN 2.8*      Last Labs     No results for input(s): LIPASE, AMYLASE in the last 168 hours.    Last Labs     No results for input(s): AMMONIA in the last 168 hours.   CBC:  Last Labs      Recent Labs Lab 10/17/15 2050 10/18/15 0901 10/18/15 1540 10/19/15 0509 10/20/15 0439  WBC 4.7 --  --  5.4 6.5  NEUTROABS 3.7 --  --  --  --   HGB 6.5* 9.3* 9.5* 9.2* 9.5*  HCT 22.6* 30.5* 31.5* 30.4* 31.7*  MCV 79.6 --  --  79.8 80.3  PLT 134* --  --  122* 150     Cardiac Enzymes:  Last Labs      Recent Labs Lab 10/17/15 2050 10/18/15 0901 10/18/15 1540  TROPONINI 0.13*  0.13* 0.08* 0.09*     BNP (last 3 results)  Recent Labs (within last 365 days)    No results for input(s): BNP in the last 8760 hours.    ProBNP (last 3 results)  Recent Labs (within last 365 days)    No results for input(s): PROBNP in the last 8760 hours.    CBG:  Last Labs      Recent Labs Lab 10/18/15 0803 10/19/15 0802 10/21/15 0709  GLUCAP 101* 103* 129*      Recent Results (from the past 240 hour(s))  Culture, blood (Routine X 2) w Reflex to ID Panel Status: None (Preliminary result)   Collection Time: 10/17/15 8:50 PM  Result Value Ref Range Status   Specimen Description BLOOD RIGHT ARM DRAWN BY RN  Final   Special Requests BOTTLES DRAWN AEROBIC Evan ANAEROBIC 6CC  Final   Culture NO GROWTH 4 DAYS  Final   Report Status PENDING  Incomplete  Culture,  blood (Routine X 2) w Reflex to ID Panel Status: None (Preliminary result)   Collection Time: 10/17/15 8:57 PM  Result Value Ref Range Status   Specimen Description BLOOD RIGHT ANTECUBITAL  Final   Special Requests BOTTLES DRAWN AEROBIC Evan ANAEROBIC [place]  Final   Culture  NO GROWTH 4 DAYS  Final   Report Status PENDING  Incomplete  MRSA PCR Screening Status: None   Collection Time: 10/17/15 11:30 PM  Result Value Ref Range Status   MRSA by PCR NEGATIVE NEGATIVE Final    Comment:   The GeneXpert MRSA Assay (FDA approved for NASAL specimens only), is one component of a comprehensive MRSA colonization surveillance program. It is not intended to diagnose MRSA infection nor to guide or monitor treatment for MRSA infections.   Urine culture Status: None   Collection Time: 10/18/15 3:00 AM  Result Value Ref Range Status   Specimen Description URINE, CLEAN CATCH  Final   Special Requests NONE  Final   Culture   Final    NO GROWTH 1 DAY Performed at [facility]    Report Status 10/19/2015 FINAL  Final     Studies:  Imaging Results (Last 48 hours)    No results found.    Scheduled Meds: . allopurinol 100 mg Oral QHS  . amoxicillin-clavulanate 1 tablet Oral Q12H  . arformoterol 15 mcg Nebulization BID  . budesonide (PULMICORT) nebulizer solution 0.25 mg Nebulization BID  . carvedilol 3.125 mg Oral BID  . donepezil 10 mg Oral QHS  . ferrous sulfate 325 mg Oral BID  . fluticasone 2 spray Each Nare Daily  . guaiFENesin 1,200 mg Oral BID  . haloperidol lactate 2 mg Intravenous Once  . ipratropium 0.5 mg Nebulization Q6H  . levalbuterol 0.63 mg Nebulization Q6H  . loratadine 10 mg Oral Daily  . oseltamivir 30 mg Oral BID  . pantoprazole (PROTONIX) IV 40 mg Intravenous Q12H  . vitamin  B-6 50 mg Oral Daily  . simvastatin 40 mg Oral QHS  . sodium chloride flush 3 mL Intravenous Q12H  . tamsulosin 0.4 mg Oral Daily  . vitamin C 250 mg Oral Daily   Continuous Infusions:    Principal Problem:  Symptomatic anemia Active Problems:  Essential Evan Melendez, benign  Mixed hyperlipidemia  Evan systolic heart failure (HCC)  CKD (Evan kidney disease) stage 3, GFR 30-59 ml/min  Ischemic cardiomyopathy  Thrombocytopenia, unspecified (HCC)  Atrial Melendez (HCC)  GI bleed  Lung mass  Anemia  Elevated troponin  Acute blood loss anemia  Melendez  Arterial hypotension  Upper GI bleeding  Melena  Influenza A  Palliative care encounter  DNR (do not resuscitate) discussion    Time spent: 42 mins    Niel Hummer AMD [facility] Pager [phone]. If 7PM-7AM, please contact night-coverage at [email or web address], password [email or web address] 10/21/2015, 11:33 AM  LOS: 4 days                          Procedures:  ECHO   Consultations:  Dr Evan Melendez  Discharge Exam: Filed Vitals:   10/21/15 2148 10/22/15 0500  BP: 131/72 133/75  Pulse: 99 101  Temp: 97.9 F (36.6 C) 97.8 F (36.6 C)  Resp: 20 20    General: Alert in no acute distress Cardiovascular: S 1, S 2 RRR Respiratory: CTA  Discharge Instructions   Discharge Instructions    Diet - low sodium heart healthy    Complete by:  As directed      Increase activity slowly    Complete by:  As directed           Current Discharge Medication List    START taking these medications   Details  amoxicillin-clavulanate (AUGMENTIN) 875-125 MG tablet Take 1 tablet by mouth every  12 (twelve) hours. Qty: 6 tablet, Refills: 0    arformoterol (BROVANA) 15 MCG/2ML NEBU Take 2 mLs (15 mcg total) by nebulization 2 (two) times daily. Qty: 120 mL, Refills: 0    budesonide (PULMICORT) 0.25 MG/2ML nebulizer solution Take 2 mLs (0.25 mg total) by  nebulization 2 (two) times daily. Qty: 60 mL, Refills: 12    guaiFENesin (MUCINEX) 600 MG 12 hr tablet Take 2 tablets (1,200 mg total) by mouth 2 (two) times daily. Qty: 30 tablet, Refills: 0    HYDROcodone-homatropine (HYCODAN) 5-1.5 MG/5ML syrup Take 5 mLs by mouth every 4 (four) hours as needed for Melendez. Qty: 120 mL, Refills: 0    ipratropium (ATROVENT) 0.02 % nebulizer solution Take 2.5 mLs (0.5 mg total) by nebulization every 6 (six) hours. Qty: 75 mL, Refills: 12    levalbuterol (XOPENEX) 0.63 MG/3ML nebulizer solution Take 3 mLs (0.63 mg total) by nebulization every 6 (six) hours. Qty: 3 mL, Refills: 12    oseltamivir (TAMIFLU) 30 MG capsule Take 1 capsule (30 mg total) by mouth 2 (two) times daily. Qty: 6 capsule, Refills: 0    pantoprazole (PROTONIX) 40 MG tablet Take 1 tablet (40 mg total) by mouth 2 (two) times daily. Qty: 60 tablet, Refills: 0      CONTINUE these medications which have CHANGED   Details  furosemide (LASIX) 20 MG tablet Take 1 tablet (20 mg total) by mouth daily. Qty: 30 tablet, Refills: 0      CONTINUE these medications which have NOT CHANGED   Details  acetaminophen (TYLENOL) 325 MG tablet Take 650 mg by mouth 2 (two) times daily.    allopurinol (ZYLOPRIM) 100 MG tablet TAKE 1 TABLET BY MOUTH AT BEDTIME Qty: 30 tablet, Refills: 5    Ascorbic Acid (VITAMIN C) 100 MG tablet Take 100 mg by mouth daily.    carvedilol (COREG) 6.25 MG tablet TAKE 1 TABLET BY MOUTH TWICE DAILY Qty: 180 tablet, Refills: 1    donepezil (ARICEPT) 10 MG tablet Take 1 tablet (10 mg total) by mouth at bedtime. Qty: 30 tablet, Refills: 6    ferrous sulfate 325 (65 FE) MG tablet Take 325 mg by mouth 2 (two) times daily.     hydrocortisone (PROCTOSOL HC) 2.5 % rectal cream APPLY RECTALLYBID AS NEEDED FOR HEMORRHOIDS. Qty: 29.328 g, Refills: PRN    Pyridoxine HCl (VITAMIN B-6 PO) Take 1 tablet by mouth daily.     simvastatin (ZOCOR) 40 MG tablet TAKE 1 TABLET BY MOUTH  AT BEDTIME Qty: 30 tablet, Refills: 5    tamsulosin (FLOMAX) 0.4 MG CAPS capsule TAKE 1 CAPSULE BY MOUTH DAILY AFTER SUPPER Qty: 30 capsule, Refills: 5    fexofenadine (ALLEGRA) 180 MG tablet Take 1 tablet (180 mg total) by mouth daily. Qty: 30 tablet, Refills: 5      STOP taking these medications     aspirin 81 MG tablet      enalapril (VASOTEC) 10 MG tablet      Lidocaine-Hydrocortisone Ace 2-2 % KIT      potassium chloride SA (K-DUR,KLOR-CON) 20 MEQ tablet        Allergies  Allergen Reactions  . Quinidine Other (See Comments)    Chest Pain   Follow-up Information    Follow up with Evan Hillier, MD In 1 week.   Specialty:  Family Medicine   Contact information:   [address] [place] [address] [phone]       Follow up with HAWKINS,EDWARD L, MD In  2 weeks.   Specialty:  Pulmonary Disease   Contact information:   [address] [place] [address] [phone]        The results of significant diagnostics from this hospitalization (including imaging, microbiology, ancillary Evan laboratory) are listed below for reference.    Significant Diagnostic Studies: Dg Chest 2 View  10/17/2015  CLINICAL DATA:  Nonproductive Melendez for 2 days. EXAM: CHEST  2 VIEW COMPARISON:  03/29/2013 FINDINGS: 7.7 cm diameter masslike opacity in the left mid lung. This could represent rounded pneumonia but appearance is highly suspicious for neoplasm. Emphysematous changes are suggested in the lungs. Heart size Evan pulmonary vascularity are normal. No blunting of costophrenic angles. No pneumothorax. Calcified Evan tortuous aorta. Postoperative changes in the mediastinum. IMPRESSION: 7.7 cm diameter mass in the left mid lung highly suspicious for neoplasm. Rounded pneumonia less likely. Electronically Signed   By: Lucienne Capers M.D.   On: 10/17/2015 19:16   Ct Chest Wo Contrast  10/17/2015  CLINICAL DATA:  Melendez, abnormal chest radiograph EXAM: CT  CHEST WITHOUT CONTRAST TECHNIQUE: Multidetector CT imaging of the chest was performed following the standard protocol without IV contrast. COMPARISON:  Chest radiograph dated 10/17/2015 FINDINGS: Mediastinum/Nodes: Cardiomegaly. No pericardial effusion. Hypodense blood pool relative to myocardium, suggesting anemia. Three vessel coronary atherosclerosis. Postsurgical changes related to prior CABG. Extensive atherosclerotic calcifications the abdominal aorta Evan branch vessels. Mediastinal lymphadenopathy, including: --14 mm short axis low right paratracheal node (series 2/ image 24) --11 mm short axis left paratracheal/ AP window node (series 2/ image 25) --10 mm short axis subcarinal node (series 2/image 29) Visualized thyroid is unremarkable. Lungs/Pleura: 6.9 x 7.3 x 7.8 cm posterior left upper lobe mass, abutting the lateral aspect of the left chest wall, Evan abutting the left fissure Evan likely transgressing the left lower lobe (sagittal image 87). 11 mm satellite nodule in the superior segment left lower lobe (series 4/ image 26), suspicious for metastasis. Underlying mild centrilobular emphysematous changes. Small left Evan trace right pleural effusions.  No pneumothorax. Upper abdomen: Visualized upper abdomen is notable for vascular calcifications Evan small volume perihepatic ascites. Musculoskeletal: Degenerative changes of the visualized thoracolumbar spine. Median sternotomy. IMPRESSION: 7.8 cm posterior left upper lobe mass, as described above. Mass abuts the left chest wall Evan left fissure, possibly involving the left lower lobe. 11 mm satellite nodule in the superior segment left lower lobe, suspicious for metastasis. Associated mediastinal lymphadenopathy. Small left pleural effusion Evan trace right pleural effusion. Electronically Signed   By: Julian Hy M.D.   On: 10/17/2015 20:19    Microbiology: Recent Results (from the past 240 hour(s))  Culture, blood (Routine X 2) w Reflex to ID  Panel     Status: None   Collection Time: 10/17/15  8:50 PM  Result Value Ref Range Status   Specimen Description BLOOD RIGHT ARM DRAWN BY RN  Final   Special Requests BOTTLES DRAWN AEROBIC Evan ANAEROBIC 6CC  Final   Culture NO GROWTH 5 DAYS  Final   Report Status 10/22/2015 FINAL  Final  Culture, blood (Routine X 2) w Reflex to ID Panel     Status: None   Collection Time: 10/17/15  8:57 PM  Result Value Ref Range Status   Specimen Description BLOOD RIGHT ANTECUBITAL  Final   Special Requests BOTTLES DRAWN AEROBIC Evan ANAEROBIC [place]  Final   Culture NO GROWTH 5 DAYS  Final   Report Status 10/22/2015 FINAL  Final  MRSA PCR Screening  Status: None   Collection Time: 10/17/15 11:30 PM  Result Value Ref Range Status   MRSA by PCR NEGATIVE NEGATIVE Final    Comment:        The GeneXpert MRSA Assay (FDA approved for NASAL specimens only), is one component of a comprehensive MRSA colonization surveillance program. It is not intended to diagnose MRSA infection nor to guide or monitor treatment for MRSA infections.   Urine culture     Status: None   Collection Time: 10/18/15  3:00 AM  Result Value Ref Range Status   Specimen Description URINE, CLEAN CATCH  Final   Special Requests NONE  Final   Culture   Final    NO GROWTH 1 DAY Performed at [facility]    Report Status 10/19/2015 FINAL  Final     Labs: Basic Metabolic Panel:  Recent Labs Lab 10/17/15 2050 10/18/15 0901 10/19/15 0509 10/20/15 0439 10/22/15 0628  NA 138 138 138 136 139  K 4.9 5.1 4.5 4.3 4.0  CL 108 109 110 104 102  CO2 22 21* 19* 23 28  GLUCOSE 115* 145* 105* 144* 123*  BUN 44* 43* 42* 38* 44*  CREATININE 1.71* 1.66* 1.53* 1.49* 1.54*  CALCIUM 7.9* 8.0* 8.1* 7.6* 7.8*  MG  --   --   --  2.0  --    Liver Function Tests:  Recent Labs Lab 10/18/15 0901  AST 81*  ALT 77*  ALKPHOS 111  BILITOT 1.3*  PROT 5.2*  ALBUMIN 2.8*   No results for input(s): LIPASE, AMYLASE in the last  168 hours. No results for input(s): AMMONIA in the last 168 hours. CBC:  Recent Labs Lab 10/17/15 2050 10/18/15 0901 10/18/15 1540 10/19/15 0509 10/20/15 0439 10/22/15 0628  WBC 4.7  --   --  5.4 6.5  --   NEUTROABS 3.7  --   --   --   --   --   HGB 6.5* 9.3* 9.5* 9.2* 9.5* 9.5*  HCT 22.6* 30.5* 31.5* 30.4* 31.7* 32.5*  MCV 79.6  --   --  79.8 80.3  --   PLT 134*  --   --  122* 150  --    Cardiac Enzymes:  Recent Labs Lab 10/17/15 2050 10/18/15 0901 10/18/15 1540  TROPONINI 0.13*  0.13* 0.08* 0.09*   BNP: BNP (last 3 results) No results for input(s): BNP in the last 8760 hours.  ProBNP (last 3 results) No results for input(s): PROBNP in the last 8760 hours.  CBG:  Recent Labs Lab 10/18/15 0803 10/19/15 0802 10/21/15 0709 10/22/15 0800  GLUCAP 101* 103* 129* 116*       Signed:  Niel Hummer A MD.  [facility] 10/22/2015, 10:14 AM

## 2015-10-23 ENCOUNTER — Other Ambulatory Visit: Payer: Self-pay | Admitting: *Deleted

## 2015-10-23 ENCOUNTER — Encounter: Payer: Self-pay | Admitting: Licensed Clinical Social Worker

## 2015-10-23 ENCOUNTER — Other Ambulatory Visit: Payer: Self-pay | Admitting: Licensed Clinical Social Worker

## 2015-10-23 NOTE — Patient Outreach (Addendum)
[facility]) Care Management  10/23/2015  TRAYLON SCHIMMING 1925/10/20 [id number]  Call received from [name], son and joint HCPOA for Mr. Laviolette. I've also spoken at length several times over the last week with [name], dtr and joint HCPOA for Mr. Barstow. Yesterday, Mr. Salinger was discharged from [facility] to [facility] for short term rehabilitation care. Long term plan is discharge to home with 24/7 care, private pay via [facility] and likely hospice care.   Mr. Nicolis Boody called today to let me know about Mr. Strothers' transfer to the nursing facility and wanted to ask me a few questions about SNF care and transition to home when appropriate. I reached out to Ms. Irven Shelling, Admissions Coordinator to let her know of [facility] [facility] involvement in Mr. Alpern' care.  I will continue to follow closely and assist with progression and discharge planning with transition to home when appropriate.    [facility]  [phone]

## 2015-10-23 NOTE — Patient Outreach (Signed)
Assessment:  CSW received referral on Evan Melendez.  CSW completed chart review on Evan Melendez.  Evan Melendez was recently hospitalized  at [facility]. Upon discharge from that hospital, Evan Melendez admitted to [facility] in [place] called [place] on 10/23/15 but was not able to speak via phone with Evan Melendez. CSW then called Evan Melendez, daughter of Evan Melendez, on 10/23/15 and spoke via phone with Evan Melendez. CSW verfied identity of Evan Melendez. [name] and [name] spoke of Evan Melendez needs.  [name] said that she and her brother [name] are both supportive of Evan Melendez. Evan Melendez and [name] are [place] of Attorneys for Evan Melendez.  [name] said she resides in [place] and her brother [name] resides in [place], [place]. Contact phone number for [name] is [phone].  Contact phone number for [name] is [phone].  [name] reported that Evan Melendez enjoys listening to music. 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 She said he enjoyed listening to music as a form of relaxation .  She said that Evan Melendez has dementia.  She said Evan Melendez has hearing limitations as a result of an injury Evan Melendez sustained while in [employer]. She said that Evan Melendez is scheduled to receive physical therapy sessions, as scheduled for Evan Melendez, at [facility] facility. She said that basic plan is for Evan Melendez to receive physical therapy support as needed at [facility] and then hopefully Evan Melendez will be able to discharge back to home of Evan Melendez in the community. [name] said that Evan Melendez has a home health aide who has been helping him with daily care needs in the home for several years.  CSW and [name] reviewed [facility] assessments information for Evan Melendez.  [name] said she was very appreciate of past support of RN [name] in addressing nursing needs of Evan Melendez.  [facility] consent form has already been  completed for Evan Melendez.  CSW gave [name] CSW phone contact number of [phone] and encouraged [name] or [name] to call CSW as needed to discuss social work needs of Evan Melendez. CSW thanked [name] for phone conversation with CSW on 10/23/15.   Plan: Evan Melendez to participate in all scheduled physical therapy sessions for Evan Melendez at [facility] facility in the next 30 days. CSW to communicate as needed with RN [name] in monitoring needs of Evan Melendez. CSW to call Evan Melendez in two weeks to discuss current status and needs of Evan Melendez.  [name].Modest Draeger MSW, LCSW Licensed Clinical Social Worker [facility] Care Management [phone]     Plan:

## 2015-10-27 ENCOUNTER — Other Ambulatory Visit: Payer: Self-pay | Admitting: Licensed Clinical Social Worker

## 2015-10-27 NOTE — Patient Outreach (Signed)
[facility]) Care Management  [facility] Social Work  10/27/2015  Evan Melendez 1926-05-17 [id number]  Subjective:    Objective:   Encounter Medications:  Outpatient Encounter Prescriptions as of 10/27/2015  Medication Sig Note  . acetaminophen (TYLENOL) 325 MG tablet Take 650 mg by mouth 2 (two) times daily. 08/12/2015: On hand  . allopurinol (ZYLOPRIM) 100 MG tablet TAKE 1 TABLET BY MOUTH AT BEDTIME 08/12/2015: Taking intermittently  . amoxicillin-clavulanate (AUGMENTIN) 875-125 MG tablet Take 1 tablet by mouth every 12 (twelve) hours.   Marland Kitchen arformoterol (BROVANA) 15 MCG/2ML NEBU Take 2 mLs (15 mcg total) by nebulization 2 (two) times daily.   . Ascorbic Acid (VITAMIN C) 100 MG tablet Take 100 mg by mouth daily.   . budesonide (PULMICORT) 0.25 MG/2ML nebulizer solution Take 2 mLs (0.25 mg total) by nebulization 2 (two) times daily.   . carvedilol (COREG) 6.25 MG tablet TAKE 1 TABLET BY MOUTH TWICE DAILY 08/12/2015: Taking intermittently  . donepezil (ARICEPT) 10 MG tablet Take 1 tablet (10 mg total) by mouth at bedtime. 08/12/2015: Taking intermittently  . ferrous sulfate 325 (65 FE) MG tablet Take 325 mg by mouth 2 (two) times daily.  08/12/2015: Taking intermittently  . fexofenadine (ALLEGRA) 180 MG tablet Take 1 tablet (180 mg total) by mouth daily. (Patient taking differently: Take 180 mg by mouth as needed for allergies. ) 08/12/2015: Taking intermittently  . furosemide (LASIX) 20 MG tablet Take 1 tablet (20 mg total) by mouth daily.   Marland Kitchen guaiFENesin (MUCINEX) 600 MG 12 hr tablet Take 2 tablets (1,200 mg total) by mouth 2 (two) times daily.   Marland Kitchen HYDROcodone-homatropine (HYCODAN) 5-1.5 MG/5ML syrup Take 5 mLs by mouth every 4 (four) hours as needed for cough.   . hydrocortisone (PROCTOSOL HC) 2.5 % rectal cream APPLY RECTALLYBID AS NEEDED FOR HEMORRHOIDS. 10/09/2015: On hand; uses prn  . ipratropium (ATROVENT) 0.02 % nebulizer solution Take 2.5 mLs (0.5 mg total) by nebulization  every 6 (six) hours.   Marland Kitchen levalbuterol (XOPENEX) 0.63 MG/3ML nebulizer solution Take 3 mLs (0.63 mg total) by nebulization every 6 (six) hours.   Marland Kitchen oseltamivir (TAMIFLU) 30 MG capsule Take 1 capsule (30 mg total) by mouth 2 (two) times daily.   . pantoprazole (PROTONIX) 40 MG tablet Take 1 tablet (40 mg total) by mouth 2 (two) times daily.   . Pyridoxine HCl (VITAMIN B-6 PO) Take 1 tablet by mouth daily.    . simvastatin (ZOCOR) 40 MG tablet TAKE 1 TABLET BY MOUTH AT BEDTIME 08/12/2015: Taking intermittently  . tamsulosin (FLOMAX) 0.4 MG CAPS capsule TAKE 1 CAPSULE BY MOUTH DAILY AFTER SUPPER 08/12/2015: Taking intermittently   No facility-administered encounter medications on file as of 10/27/2015.    Functional Status:  In your present state of health, do you have any difficulty performing the following activities: 10/23/2015 10/23/2015  Hearing? Evan Melendez  Vision? N N  Difficulty concentrating or making decisions? Evan Melendez  Walking or climbing stairs? Y N  Dressing or bathing? Y N  Doing errands, shopping? Evan Melendez  Preparing Food and eating ? Y N  Using the Toilet? N N  In the past six months, have you accidently leaked urine? N N  Do you have problems with loss of bowel control? N N  Managing your Medications? Y Y  Managing your Finances? Evan Melendez  Housekeeping or managing your Housekeeping? Evan Melendez    Fall/Depression Screening:  [facility] 2/9 Scores 10/23/2015 10/23/2015 07/30/2015 07/29/2015  PHQ - 2 Score  0 0 0 1    Assessment:   CSW traveled to [facility] in [place], [place] on 10/27/15 and visited client at that facility. CSW met with client in room of client at [facility] facility. Patient assessed in  [place] for continued care needs. CSW will continue to collaborate with the skilled nursing facility social worker to facilitate discharge planning needs and communicate with the patient and family.  Client is receiving nursing care at facility. Client is also receiving physical therapy  services, as scheduled for client at facility. Client has supportive family. Client's daughter and son are Evan Melendez of Attorney for client.  Client likes to listen to [employer] or watch TV to relax. Client said that when he resides at his home he often goes to the local [employer] for exercise. He said he likes to use the treadmill to walk for exercise.  Client said he hopes to receive physical therapy services and nursing care at [facility] and then hopes to return to his home in the community (with needed supports in place).  CSW encouraged client to talk with facility social worker about developing discharge plan for client. Client said he is not having any pain issues at present. He said he is eating well and sleeping well. He wears glasses to help with vision. Client said he is adjusting to facility and doing well at facility. He appreciates help he receives from facility care givers. He said he would talk with facility social worker about developing discharge plan for client.  CSW thanked [place] for allowing CSW to visit him at [facility] facility on 10/27/15.  CSW left [facility] CSW card with Evan Melendez.  CSW encouraged Evan Melendez to call CSW at [phone] as needed to discuss social work needs of client.   Plan:  Client to participate in scheduled physical therapy sessions for client at facility for the next 30 days. Client to talk with facility social worker to develop discharge plan for client.   CSW to call client/daughter of client in two weeks to assess status and needs of client at that time.   [name].Brinae Woods MSW, LCSW Licensed Clinical Social Worker [facility] Care Management [phone]

## 2015-11-06 ENCOUNTER — Ambulatory Visit: Payer: Medicare Other | Admitting: Family Medicine

## 2015-11-09 ENCOUNTER — Ambulatory Visit: Payer: Self-pay | Admitting: Licensed Clinical Social Worker

## 2015-11-10 ENCOUNTER — Other Ambulatory Visit: Payer: Self-pay | Admitting: Family Medicine

## 2015-11-10 ENCOUNTER — Other Ambulatory Visit: Payer: Self-pay | Admitting: Licensed Clinical Social Worker

## 2015-11-10 NOTE — Patient Outreach (Signed)
Assessment:  CSW spoke via phone with client on 11/10/15. CSW verified client identity. CSW and client spoke of client needs. Client is currently residing at [facility] in [place], [place]. He is receiving nursing care at that facility. He is also receiving physical therapy support at that facility. He has family support from his son and from his daughter.   CSW and client spoke of client care plan. CSW encouraged client to participate in all scheduled client physical therapy sessions at [facility] for the next 30 days.  Client said he was receiving physical therapy support at facility. Client said he has been talking with facility social worker related to finalizing discharge plan for client. Client is hoping to be able to eventually discharge from facility and to return to his home in the community with needed supports in place. Client is hard of hearing.  Client said he was eating well and sleeping well.  CSW thanked client for phone call with CSW on 11/10/15. CSW encouraged client to call CSW at [phone] as needed to discuss social work needs of client.    Plan:  Client to participate in all scheduled client physical therapy sessions at [facility] for next 30 days. Client to communicate as needed with facility social worker to finalize discharge plan for client. CSW to call client in two weeks to assess client needs.   [name].Ali Mohl MSW, LCSW Licensed Clinical Social Worker [facility] Care Management [phone]

## 2015-11-16 ENCOUNTER — Other Ambulatory Visit: Payer: Self-pay | Admitting: *Deleted

## 2015-11-16 NOTE — Patient Outreach (Signed)
[facility]) Care Management SNF Discharge Planning  11/16/2015  Evan Melendez 1925-11-27 [id number]   Call received from [name] (dtr/HCPOA/primary caregiver) and Lowella Fairy ([place]) who stated that Evan Melendez is scheduled for discharge to home today.   Evan Melendez is on IV antibiotics as follows: Zosyn q6h, Vancomycin 1gm q24h for treatment of pneumonia. Evan Melendez is scheduled to begin care on 11/17/15 and can teach a family member how to administer medications.  Contacts for care coordination:   [name] (Dtr/HCPOA/primary caregiver) [phone]  [facility]) [phone]  Lowella Fairy (SW/Discharge Planner, [place]) [phone]  Juliann Pulse ([place]) [phone]  Plan: I will follow up with Evan Melendez tomorrow (11/17/15) for transition of care assessment.    [place] Management  [phone]

## 2015-11-17 ENCOUNTER — Other Ambulatory Visit: Payer: Medicare Other | Admitting: *Deleted

## 2015-11-19 ENCOUNTER — Other Ambulatory Visit: Payer: Self-pay | Admitting: *Deleted

## 2015-11-19 NOTE — Patient Outreach (Signed)
[facility]) Care Management  11/19/2015  Evan Melendez [birth year] [id number]  I spoke with Evan Melendez (dtr/primary caregiver/HCPOA) by phone today. She said she had a long discussion with Evan Melendez and her brother Evan Melendez and feels it might be appropriate to revisit Hospice care.   During Evan Melendez' hospitalization, a hospice referral was made to [facility]. Evan Melendez, at that time, verbalized understanding and agreement with this plan. However, as he progressed, Evan Melendez opted to go to short term SNF care at [facility] for rehab and therapies, hoping his strength would improve. Unfortunately, Evan Melendez contracted flu and had pneumonia. Treatment for pneumonia began at [facility] then completed at home (IV Zosyn) through [facility]. He completed his last IV Zosyn dose on Tuesday at [facility].   Evan Melendez reports that while Evan Melendez is maybe "slightly" improved after IV antibiotics, "it is clear" that Evan Melendez' condition is poor and she and her brother would like to make sure that if Hospice is most appropriate for Evan Melendez, they have explored that option fully and offered those services to Evan Melendez.   I left a message with [facility] at [phone] requesting a return call.   Plan: I will follow up with Evan Melendez and Evan Melendez after I hear from Evan Melendez.    [place] Management  [phone]

## 2015-11-20 ENCOUNTER — Other Ambulatory Visit: Payer: Self-pay | Admitting: *Deleted

## 2015-11-20 NOTE — Patient Outreach (Signed)
[facility]) Care Management  11/20/2015  Evan Melendez 04-09-1926 [id number]  Return call received from [place] at [facility] today. As requested by Mr. Stroble and his daughter [name], I asked if Ms. Suggs might be available next week to meet with Mr. Dills and his children to provide information and answer questions for them related to initiation of in home Hospice Care.   Plan: Ms. Dicky Doe scheduled an appointment with Mr. Menter and his children for Tuesday, May 2017 @ 10:30am. I will attend the meeting.    [facility] Management  [phone]

## 2015-11-22 ENCOUNTER — Emergency Department (HOSPITAL_COMMUNITY)
Admission: EM | Admit: 2015-11-22 | Discharge: 2015-11-22 | Disposition: A | Discharge status: Home / Self Care | Payer: Medicare Other | Attending: Emergency Medicine | Admitting: Emergency Medicine

## 2015-11-22 ENCOUNTER — Encounter (HOSPITAL_COMMUNITY): Payer: Self-pay | Admitting: *Deleted

## 2015-11-22 ENCOUNTER — Emergency Department (HOSPITAL_COMMUNITY): Payer: Medicare Other

## 2015-11-22 DIAGNOSIS — E782 Mixed hyperlipidemia: Secondary | ICD-10-CM | POA: Insufficient documentation

## 2015-11-22 DIAGNOSIS — Z79899 Other long term (current) drug therapy: Secondary | ICD-10-CM | POA: Diagnosis not present

## 2015-11-22 DIAGNOSIS — Z87891 Personal history of nicotine dependence: Secondary | ICD-10-CM | POA: Diagnosis not present

## 2015-11-22 DIAGNOSIS — I10 Essential (primary) hypertension: Secondary | ICD-10-CM | POA: Diagnosis not present

## 2015-11-22 DIAGNOSIS — I252 Old myocardial infarction: Secondary | ICD-10-CM | POA: Insufficient documentation

## 2015-11-22 DIAGNOSIS — I4891 Unspecified atrial fibrillation: Secondary | ICD-10-CM | POA: Diagnosis not present

## 2015-11-22 DIAGNOSIS — R0602 Shortness of breath: Secondary | ICD-10-CM | POA: Insufficient documentation

## 2015-11-22 DIAGNOSIS — R0600 Dyspnea, unspecified: Secondary | ICD-10-CM

## 2015-11-22 HISTORY — DX: Malignant (primary) neoplasm, unspecified: C80.1

## 2015-11-22 HISTORY — DX: Unspecified atrial fibrillation: I48.91

## 2015-11-22 LAB — CBC WITH DIFFERENTIAL/PLATELET
Basophils Absolute: 0 10*3/uL (ref 0.0–0.1)
Basophils Relative: 0 %
EOS ABS: 0.1 10*3/uL (ref 0.0–0.7)
EOS PCT: 2 %
HCT: 34.8 % — ABNORMAL LOW (ref 39.0–52.0)
Hemoglobin: 10.1 g/dL — ABNORMAL LOW (ref 13.0–17.0)
LYMPHS ABS: 0.5 10*3/uL — AB (ref 0.7–4.0)
Lymphocytes Relative: 9 %
MCH: 24 pg — AB (ref 26.0–34.0)
MCHC: 29 g/dL — AB (ref 30.0–36.0)
MCV: 82.7 fL (ref 78.0–100.0)
MONOS PCT: 8 %
Monocytes Absolute: 0.4 10*3/uL (ref 0.1–1.0)
Neutro Abs: 4.4 10*3/uL (ref 1.7–7.7)
Neutrophils Relative %: 81 %
PLATELETS: 85 10*3/uL — AB (ref 150–400)
RBC: 4.21 MIL/uL — ABNORMAL LOW (ref 4.22–5.81)
RDW: 20.2 % — ABNORMAL HIGH (ref 11.5–15.5)
WBC: 5.4 10*3/uL (ref 4.0–10.5)

## 2015-11-22 LAB — COMPREHENSIVE METABOLIC PANEL
ALT: 25 U/L (ref 17–63)
ANION GAP: 6 (ref 5–15)
AST: 28 U/L (ref 15–41)
Albumin: 2.5 g/dL — ABNORMAL LOW (ref 3.5–5.0)
Alkaline Phosphatase: 101 U/L (ref 38–126)
BUN: 29 mg/dL — ABNORMAL HIGH (ref 6–20)
CHLORIDE: 106 mmol/L (ref 101–111)
CO2: 32 mmol/L (ref 22–32)
Calcium: 8.2 mg/dL — ABNORMAL LOW (ref 8.9–10.3)
Creatinine, Ser: 1.78 mg/dL — ABNORMAL HIGH (ref 0.61–1.24)
GFR calc non Af Amer: 33 mL/min — ABNORMAL LOW (ref >60)
GFR, EST AFRICAN AMERICAN: 38 mL/min — AB (ref >60)
Glucose, Bld: 109 mg/dL — ABNORMAL HIGH (ref 65–99)
Potassium: 3.7 mmol/L (ref 3.5–5.1)
SODIUM: 144 mmol/L (ref 135–145)
Total Bilirubin: 0.9 mg/dL (ref 0.3–1.2)
Total Protein: 5.1 g/dL — ABNORMAL LOW (ref 6.5–8.1)

## 2015-11-22 LAB — I-STAT TROPONIN, ED: Troponin i, poc: 0.04 ng/mL (ref 0.00–0.08)

## 2015-11-22 LAB — BRAIN NATRIURETIC PEPTIDE: B NATRIURETIC PEPTIDE 5: 1894 pg/mL — AB (ref 0.0–100.0)

## 2015-11-22 NOTE — Discharge Instructions (Signed)
Call Dr. Luan Pulling office tomorrow to arrange for home oxygen

## 2015-11-22 NOTE — ED Provider Notes (Signed)
CSN: [id number]     Arrival date & time 11/22/15  1300 History   First MD Initiated Contact with Patient 11/22/15 1306     Chief Complaint  Patient presents with  . Shortness of Breath     (Consider location/radiation/quality/duration/timing/severity/associated sxs/prior Treatment) Patient is a 82 y.o. male presenting with shortness of breath. The history is provided by a relative (Patient has been more fatigued and short of breath recently. He has diagnosis of lung cancer and was on oxygen in the hospital before).  Shortness of Breath Severity:  Mild Onset quality:  Gradual Timing:  Intermittent Progression:  Waxing and waning Chronicity:  Recurrent Context: activity   Associated symptoms: no abdominal pain, no chest pain, no cough, no headaches and no rash     Past Medical History  Diagnosis Date  . Essential hypertension, benign   . Anemia   . Gout   . ED (erectile dysfunction)   . Mixed hyperlipidemia   . Coronary atherosclerosis of native coronary artery     Multivessel status post CABG  . Impaired fasting glucose   . Insomnia   . CTS (carpal tunnel syndrome)   . Chronic systolic heart failure ([place])   . Myocardial infarction ([place]) 1985  . Ischemic cardiomyopathy     LVEF 30% with severe LVH  . DDD (degenerative disc disease), lumbar   . Hearing difficulty   . Dementia   . Hemorrhoids   . Cancer ([place])   . A-fib [facility])    Past Surgical History  Procedure Laterality Date  . Appendectomy    . Prostatectomy    . Cholecystectomy    . Tonsillectomy    . Colonoscopy    . Coronary artery bypass graft  2004    Dr. Roxy Manns - LIMA to LAD, SVG to circumflex, SVG to RCA  . Hand surgery Bilateral     Dupuytren's contracture repair bilaterally, right carpal tunnel syndrome surgery  . Nasal sinus surgery    . Colonoscopy  2003    Dr. Lindalou Hose: adenomatous polyp  . Colonoscopy  2010    Dr. Lindalou Hose: tubular adenoma, hyperplastic polyp. Surveillance requested for 2013  .  Inguinal hernia repair Right   . Colonoscopy N/A 10/14/2013    Dr. Rourk:Multiple colonic polyps-removed as described above/Minimal internal hemorrhoids-cannot exclude small anal fissure. Tubular adenomas  . Hemorrhoid banding     Family History  Problem Relation Age of Onset  . Dementia Father   . Stroke Father   . Rheum arthritis Mother   . Heart disease Mother   . Colon cancer Neg Hx    Social History  Substance Use Topics  . Smoking status: Former Smoker -- 3.00 packs/day for 30 years    Types: Cigarettes    Start date: 06/04/1940    Quit date: 10/15/1983  . Smokeless tobacco: Never Used  . Alcohol Use: No    Review of Systems  Constitutional: Negative for appetite change and fatigue.  HENT: Negative for congestion, ear discharge and sinus pressure.   Eyes: Negative for discharge.  Respiratory: Positive for shortness of breath. Negative for cough.   Cardiovascular: Negative for chest pain.  Gastrointestinal: Negative for abdominal pain and diarrhea.  Genitourinary: Negative for frequency and hematuria.  Musculoskeletal: Negative for back pain.  Skin: Negative for rash.  Neurological: Negative for seizures and headaches.  Psychiatric/Behavioral: Negative for hallucinations.      Allergies  Quinidine  Home Medications   Prior to Admission medications   Medication Sig Start Date  End Date Taking? Authorizing Provider  allopurinol (ZYLOPRIM) 100 MG tablet TAKE 1 TABLET BY MOUTH AT BEDTIME 11/10/15  Yes Mikey Kirschner, MD  Amino Acids-Protein Hydrolys (FEEDING SUPPLEMENT, PRO-STAT SUGAR FREE 64,) LIQD Take 30 mLs by mouth 2 (two) times daily.   Yes Historical Provider, MD  arformoterol (BROVANA) 15 MCG/2ML NEBU Take 2 mLs (15 mcg total) by nebulization 2 (two) times daily. 10/22/15  Yes Belkys A Regalado, MD  Ascorbic Acid (VITAMIN C) 100 MG tablet Take 100 mg by mouth daily.   Yes Historical Provider, MD  atorvastatin (LIPITOR) 40 MG tablet Take 40 mg by mouth daily.    Yes Historical Provider, MD  budesonide (PULMICORT) 0.25 MG/2ML nebulizer solution Take 2 mLs (0.25 mg total) by nebulization 2 (two) times daily. 10/22/15  Yes Belkys A Regalado, MD  carvedilol (COREG) 6.25 MG tablet TAKE 1 TABLET BY MOUTH TWICE DAILY 07/06/15  Yes Satira Sark, MD  Cholecalciferol (VITAMIN D) 2000 units CAPS Take 2,000 Units by mouth 2 (two) times daily.   Yes Historical Provider, MD  donepezil (ARICEPT) 10 MG tablet Take 1 tablet (10 mg total) by mouth at bedtime. 03/09/15  Yes Ward Givens, NP  ferrous sulfate 325 (65 FE) MG tablet Take 325 mg by mouth 2 (two) times daily.    Yes Historical Provider, MD  fexofenadine (ALLEGRA) 180 MG tablet Take 1 tablet (180 mg total) by mouth daily. 03/13/14  Yes Mikey Kirschner, MD  furosemide (LASIX) 40 MG tablet Take 40 mg by mouth daily.   Yes Historical Provider, MD  haloperidol (HALDOL) 2 MG tablet Take 2 mg by mouth daily as needed for agitation.   Yes Historical Provider, MD  HYDROcodone-acetaminophen (NORCO/VICODIN) 5-325 MG tablet Take 1 tablet by mouth every 6 (six) hours as needed for moderate pain.   Yes Historical Provider, MD  ipratropium (ATROVENT) 0.02 % nebulizer solution Take 2.5 mLs (0.5 mg total) by nebulization every 6 (six) hours. 10/22/15  Yes Belkys A Regalado, MD  levalbuterol (XOPENEX) 0.63 MG/3ML nebulizer solution Take 3 mLs (0.63 mg total) by nebulization every 6 (six) hours. Patient taking differently: Take 0.63 mg by nebulization 2 (two) times daily.  10/22/15  Yes Belkys A Regalado, MD  levofloxacin (LEVAQUIN) 500 MG tablet Take 500 mg by mouth daily.   Yes Historical Provider, MD  levothyroxine (SYNTHROID, LEVOTHROID) 137 MCG tablet Take 137 mcg by mouth daily before breakfast.   Yes Historical Provider, MD  lidocaine (LIDODERM) 5 % Place 1 patch onto the skin daily as needed (Pain). Remove & Discard patch within 12 hours or as directed by MD   Yes Historical Provider, MD  LORazepam (ATIVAN) 0.5 MG tablet  Take 0.5 mg by mouth every 12 (twelve) hours as needed for anxiety.   Yes Historical Provider, MD  pantoprazole (PROTONIX) 40 MG tablet Take 1 tablet (40 mg total) by mouth 2 (two) times daily. 10/22/15  Yes Belkys A Regalado, MD  piperacillin-tazobactam (ZOSYN) 3.375 GM/50ML IVPB Inject 3.375 g into the vein every 12 (twelve) hours.   Yes Historical Provider, MD  potassium chloride (K-DUR,KLOR-CON) 10 MEQ tablet Take 10 mEq by mouth daily.   Yes Historical Provider, MD  Pyridoxine HCl (VITAMIN B-6 PO) Take 1 tablet by mouth daily.    Yes Historical Provider, MD  tamsulosin (FLOMAX) 0.4 MG CAPS capsule TAKE 1 CAPSULE BY MOUTH DAILY AFTER SUPPER Patient taking differently: TAKE 1 CAPSULE BY MOUTH DAILY 08/04/15  Yes Mikey Kirschner, MD  acetaminophen (TYLENOL) 325  MG tablet Take 650 mg by mouth 2 (two) times daily as needed for mild pain.     Historical Provider, MD  amoxicillin-clavulanate (AUGMENTIN) 875-125 MG tablet Take 1 tablet by mouth every 12 (twelve) hours. 10/22/15   Belkys A Regalado, MD  furosemide (LASIX) 20 MG tablet Take 1 tablet (20 mg total) by mouth daily. 10/22/15   Belkys A Regalado, MD  guaiFENesin (MUCINEX) 600 MG 12 hr tablet Take 2 tablets (1,200 mg total) by mouth 2 (two) times daily. 10/22/15   Belkys A Regalado, MD  HYDROcodone-homatropine (HYCODAN) 5-1.5 MG/5ML syrup Take 5 mLs by mouth every 4 (four) hours as needed for cough. 10/22/15   Belkys A Regalado, MD  hydrocortisone (PROCTOSOL HC) 2.5 % rectal cream APPLY RECTALLYBID AS NEEDED FOR HEMORRHOIDS. 07/14/15   Mikey Kirschner, MD  oseltamivir (TAMIFLU) 30 MG capsule Take 1 capsule (30 mg total) by mouth 2 (two) times daily. 10/22/15   Belkys A Regalado, MD  simvastatin (ZOCOR) 40 MG tablet TAKE 1 TABLET BY MOUTH AT BEDTIME 11/10/15   Mikey Kirschner, MD   BP 107/67 mmHg  Pulse 91  Temp(Src) 98 F (36.7 C) (Oral)  Resp 21  Ht '5\' 10"'$  (1.778 m)  Wt 154 lb (69.854 kg)  BMI 22.10 kg/m2  SpO2 99% Physical Exam   Constitutional: He is oriented to person, place, and time. He appears well-developed.  HENT:  Head: Normocephalic.  Eyes: Conjunctivae and EOM are normal. No scleral icterus.  Neck: Neck supple. No thyromegaly present.  Cardiovascular: Normal rate and regular rhythm.  Exam reveals no gallop and no friction rub.   No murmur heard. Pulmonary/Chest: No stridor. He has no wheezes. He has no rales. He exhibits no tenderness.  Abdominal: He exhibits no distension. There is no tenderness. There is no rebound.  Musculoskeletal: Normal range of motion. He exhibits no edema.  2+ edema in ankles  Lymphadenopathy:    He has no cervical adenopathy.  Neurological: He is oriented to person, place, and time. He exhibits normal muscle tone. Coordination normal.  Skin: No rash noted. No erythema.  Psychiatric: He has a normal mood and affect. His behavior is normal.    ED Course  Procedures (including critical care time) Labs Review Labs Reviewed  CBC WITH DIFFERENTIAL/PLATELET - Abnormal; Notable for the following:    RBC 4.21 (*)    Hemoglobin 10.1 (*)    HCT 34.8 (*)    MCH 24.0 (*)    MCHC 29.0 (*)    RDW 20.2 (*)    Platelets 85 (*)    Lymphs Abs 0.5 (*)    All other components within normal limits  COMPREHENSIVE METABOLIC PANEL - Abnormal; Notable for the following:    Glucose, Bld 109 (*)    BUN 29 (*)    Creatinine, Ser 1.78 (*)    Calcium 8.2 (*)    Total Protein 5.1 (*)    Albumin 2.5 (*)    GFR calc non Af Amer 33 (*)    GFR calc Af Amer 38 (*)    All other components within normal limits  BRAIN NATRIURETIC PEPTIDE - Abnormal; Notable for the following:    B Natriuretic Peptide 1894.0 (*)    All other components within normal limits  I-STAT TROPOININ, ED    Imaging Review Dg Chest 2 View  11/22/2015  CLINICAL DATA:  Shortness of breath and cough. EXAM: CHEST  2 VIEW COMPARISON:  10/17/2015 FINDINGS: Right arm PICC line tip is noted  in the projection of the cavoatrial  junction. Lung volumes are low. There is asymmetric elevation of the left hemidiaphragm. Previous median sternotomy and CABG procedure. Aortic atherosclerosis is noted. There are small bilateral pleural effusions left greater in right, unchanged from previous exam. The left lung mass is again noted measuring 9.6 cm. IMPRESSION: 1. Left lung mass as before. 2. Bilateral pleural effusions, left greater than right. Electronically Signed   By: Kerby Moors M.D.   On: 11/22/2015 15:06   I have personally reviewed and evaluated these images and lab results as part of my medical decision-making.   EKG Interpretation None      MDM   Final diagnoses:  Dyspnea    Patient labs unremarkable except for BNP close to 2000. Chest x-ray shows the lung mass and effusions but neither is change. Patient's room air pulse ox ranges from 89-93%. Patient will be sent home with instructions to contact his family doctor tomorrow to be set up with home oxygen. Also his Lasix will be doubled for 3 days    Milton Ferguson, MD 11/22/15 [phone]

## 2015-11-22 NOTE — ED Notes (Signed)
Pt left ED via wheelchair with family and with no signs of distress. Family verbalizes discharge instructions.

## 2015-11-22 NOTE — ED Notes (Signed)
Pt arrived via [facility] from home on 4L O2 via [place]. No c/o pain. Pt complains of SOB. He has recent dx of lung Cx and is very HOH. Pt has PICC line to RUA and +2 Edema to BLE.

## 2015-11-23 ENCOUNTER — Other Ambulatory Visit: Payer: Self-pay | Admitting: *Deleted

## 2015-11-23 ENCOUNTER — Other Ambulatory Visit: Payer: Self-pay | Admitting: Licensed Clinical Social Worker

## 2015-11-23 NOTE — Patient Outreach (Signed)
Assessment:  CSW spoke via phone with [name], daughter and [place] of Attorney for client, on 11/23/15.  CSW verified identity of [name]. [name] and [place] spoke of client needs. Client had gone to the emergency room this past weekend and had returned home.  [name] reported that client has been home for one week and is receiving home health nursing support through [facility].  [name] said that Hospice representative is scheduled to meet with client, [name] and RN [name] on 11/24/15 at 10:00 AM at home of client.  [name] said client does have nebulizer and is taking nebulizer treatments 2 times daily. [name] said client had fluid in his ankles and feet. She said emergency room doctor had encouraged her to give client 40 mg extra dose of lasix for 3 days. She is following this recommendation from emergency room doctor. CSW talked with [name] about Hospice support and about quality of life issues for [employer]. She said she wants him to be pain free and have a good quality of life.  CSW and [name] spoke of supportive services offered through [facility] [place], [place].  [place] spoke with [name] about wide range of support services through [facility] [place], [place]. [name] reported that she and client were very appreciative of nursing support from [employer]. CSW thanked [name] for phone call with CSW on 11/23/15.  CSW encouraged for client or [name] to call CSW as needed to discuss social work needs of client   Plan: Client and [name] and RN [name] to meet on 11/24/15 at 10:00 AM at home of client with representative of [facility] [place], [place] to discuss Hospice support options for client. CSW and RN [name] to collaborate as needed to monitor needs of client. CSW to call client/Patricia Kathyrn Drown within the next week to assess client needs.  [name].Travone Georg MSW, LCSW Licensed Clinical Social  Worker [facility] Care Management [phone]  .

## 2015-11-23 NOTE — Patient Outreach (Signed)
[facility]) Care Management  11/23/2015  JAKOBIE HENSLEE 1925-08-22 [id number]  Call received from Mrs. [name] (dtr/HCPOA,primary caregiver for Mr. Gibler). Mr. Wares was taken by EMS to the emergency department at [facility] this weekend when he became acutely confused and agitated. He was treated and discharged to home with recommendation from the ED physician to contact is primary care provider on Monday to request order for Home O2. His O2 sat in the emergency department was as low as 86-87% according to Mrs. Kathyrn Drown.   Mrs. Kathyrn Drown left a message at the office of Dr. Sinda Du passing along the request for an O2 order as recommended. In addition, she asked the office to authorize refills for the following medications for Mr. Klinke: Ativan0.'5mg'$  1/2 tab q12h as needed for anxiety Levothyroid 161mb 1 tab qd Protonix '40mg'$  q tab bid Pulmicort Suspension 0.'25mg'$ /267m- inhale bid via nebulation   Additionally, Mrs. JaKathyrn Drowntates that while the decision for Mr. HaSpildeo have DNR status was made during his last hospitalization, he does not have a yellow/stop sign DNR form in his home. I asked for this document from Dr. HaLuan Pullings well.   I contacted Dr.Hawkins' office and spoke with the nurse to follow up on these requests and to offer my assistance with any related needs.   Plan: I will follow up with Mr. HaStoudtnd his daughter by phone this afternoon and will join them, at Mr. HaConveynd Mrs. JaAlysia Pennaequest, for the Hospice intake meeting tomorrow morning at 10:30 am at Mr. HaBoguszhome.    [employer]  [phone]

## 2015-11-24 ENCOUNTER — Emergency Department (HOSPITAL_COMMUNITY)
Admission: EM | Admit: 2015-11-24 | Discharge: 2015-11-24 | Disposition: A | Discharge status: Home / Self Care | Payer: Medicare Other | Attending: Emergency Medicine | Admitting: Emergency Medicine

## 2015-11-24 ENCOUNTER — Other Ambulatory Visit: Payer: Self-pay | Admitting: *Deleted

## 2015-11-24 ENCOUNTER — Encounter: Payer: Self-pay | Admitting: *Deleted

## 2015-11-24 ENCOUNTER — Encounter (HOSPITAL_COMMUNITY): Payer: Self-pay | Admitting: Emergency Medicine

## 2015-11-24 DIAGNOSIS — E785 Hyperlipidemia, unspecified: Secondary | ICD-10-CM | POA: Diagnosis not present

## 2015-11-24 DIAGNOSIS — I110 Hypertensive heart disease with heart failure: Secondary | ICD-10-CM | POA: Diagnosis not present

## 2015-11-24 DIAGNOSIS — Z87891 Personal history of nicotine dependence: Secondary | ICD-10-CM | POA: Diagnosis not present

## 2015-11-24 DIAGNOSIS — L03211 Cellulitis of face: Secondary | ICD-10-CM

## 2015-11-24 DIAGNOSIS — Z79899 Other long term (current) drug therapy: Secondary | ICD-10-CM | POA: Insufficient documentation

## 2015-11-24 DIAGNOSIS — I252 Old myocardial infarction: Secondary | ICD-10-CM | POA: Diagnosis not present

## 2015-11-24 DIAGNOSIS — I4891 Unspecified atrial fibrillation: Secondary | ICD-10-CM | POA: Insufficient documentation

## 2015-11-24 DIAGNOSIS — I5022 Chronic systolic (congestive) heart failure: Secondary | ICD-10-CM | POA: Insufficient documentation

## 2015-11-24 DIAGNOSIS — R220 Localized swelling, mass and lump, head: Secondary | ICD-10-CM | POA: Insufficient documentation

## 2015-11-24 MED ORDER — DOXYCYCLINE HYCLATE 100 MG PO TABS
100.0000 mg | ORAL_TABLET | Freq: Once | ORAL | Status: AC
  Administered 2015-11-24: 100 mg via ORAL
  Filled 2015-11-24: qty 1

## 2015-11-24 MED ORDER — DOXYCYCLINE HYCLATE 100 MG PO CAPS: 100.0000 mg | ORAL_CAPSULE | Freq: Two times a day (BID) | ORAL | Status: AC

## 2015-11-24 NOTE — ED Notes (Addendum)
Per EMS, pt from home initially called out for possible stroke symptoms. Upon EMS arrival, pt negative for stroke symptoms. Family reports last night patient went to bed with no edema to face. States when pt woke this morning pt had edema to LT eye, blurred vision, and ringing in LT air. Pt denies any pain. Pt AOx4 and follow commands. Pt noted to have swelling to LT eye. EMS reports pt is supposed to begin with hospice for lung cancer.

## 2015-11-24 NOTE — ED Notes (Signed)
Order received from Dr. Luan Pulling office to d/c PICC line, cosigned by Dr. Lacinda Axon.

## 2015-11-24 NOTE — Patient Outreach (Signed)
[facility]) Care Management  [place]  11/24/2015   Evan Melendez 1926/05/15 [id number]   [name] is an 82 year old male who has history of HTN, Hyperlipdemia, CAD, CHF, CKD, and Dementia. [name] was admitted to the hospital GI Bleeding and symptomatic anemia between 10/17/15 and 10/22/15. During that hospitalization, it was discovered that [name] had a lung mass, likely primary lung cancer. He choose at that time not to pursue aggressive treatment for the lung mass, agreed to DNR status (10/19/15), and was referred to [facility]. However, by the end of his hospitalization, he decided to go to a skilled nursing facility for rehab care. He was discharged to home from the skilled nursing facility on 11/16/15 with home health. [name] and his children have determined that at this time a meeting with the [facility] representative is warranted so that options for care and goals of care discussion can be undertaken.   Today, I was scheduled to meet in the home of Evan Melendez with his children and the [facility] representative today. However, I received a call from [name]' daughter who reports that when [name]' son arrived at his home this morning, he had a significant and new drooping of one side of his face and "seemed different". The family called 30 and is awaiting their arrival.   Plan: I contacted Evan Melendez at [facility] to notify her of a change in plans for today's meeting and will follow up with her after I speak with [name]' daughter later today.   [place] Management Coordinator Direct Dial:  [phone]  Fax: [phone]

## 2015-11-24 NOTE — ED Provider Notes (Signed)
CSN: [id number]     Arrival date & time 11/24/15  1002 History  By signing my name below, I, Stephania Fragmin, attest that this documentation has been prepared under the direction and in the presence of Nat Christen, MD. Electronically Signed: Stephania Fragmin, ED Scribe. 11/24/2015. 11:33 AM.    Chief Complaint  Patient presents with  . Eye Problem   The history is provided by a relative (son). No language interpreter was used.    HPI Comments: Evan Melendez is a 82 y.o. male with a history of lung cancer, brought in by ambulance, who presents to the Emergency Department complaining of gradual-onset, constant, left eye swelling that his caretaker first noticed this morning, per son. His son states patient's caretaker originally called out EMS for concern for a stroke, as it appeared the left side of his eye was "drooping." Patient's son states he last saw him 2 days ago, and patient did not have these symptoms. He states besides the chief complaint, patient is otherwise at his baseline, with normal-appearing skin pallor. His son reports a history of lung cancer, left greater than right. Patient has NKDA to antibiotics.  Patient's daughter asks for a PICC line, which was placed after having pneumonia in rehab. He has not used it since 3/25. Patient's daughter states Dr. Luan Pulling had  Past Medical History  Diagnosis Date  . Essential hypertension, benign   . Anemia   . Gout   . ED (erectile dysfunction)   . Mixed hyperlipidemia   . Coronary atherosclerosis of native coronary artery     Multivessel status post CABG  . Impaired fasting glucose   . Insomnia   . CTS (carpal tunnel syndrome)   . Chronic systolic heart failure ([place])   . Myocardial infarction ([place]) 1985  . Ischemic cardiomyopathy     LVEF 30% with severe LVH  . DDD (degenerative disc disease), lumbar   . Hearing difficulty   . Dementia   . Hemorrhoids   . Cancer ([place])   . A-fib [facility])    Past Surgical History  Procedure Laterality  Date  . Appendectomy    . Prostatectomy    . Cholecystectomy    . Tonsillectomy    . Colonoscopy    . Coronary artery bypass graft  2004    Dr. Roxy Manns - LIMA to LAD, SVG to circumflex, SVG to RCA  . Hand surgery Bilateral     Dupuytren's contracture repair bilaterally, right carpal tunnel syndrome surgery  . Nasal sinus surgery    . Colonoscopy  2003    Dr. Lindalou Hose: adenomatous polyp  . Colonoscopy  2010    Dr. Lindalou Hose: tubular adenoma, hyperplastic polyp. Surveillance requested for 2013  . Inguinal hernia repair Right   . Colonoscopy N/A 10/14/2013    Dr. Rourk:Multiple colonic polyps-removed as described above/Minimal internal hemorrhoids-cannot exclude small anal fissure. Tubular adenomas  . Hemorrhoid banding     Family History  Problem Relation Age of Onset  . Dementia Father   . Stroke Father   . Rheum arthritis Mother   . Heart disease Mother   . Colon cancer Neg Hx    Social History  Substance Use Topics  . Smoking status: Former Smoker -- 3.00 packs/day for 30 years    Types: Cigarettes    Start date: 06/04/1940    Quit date: 10/15/1983  . Smokeless tobacco: Never Used  . Alcohol Use: No    Review of Systems  All other systems reviewed and are negative.  Allergies  Quinidine  Home Medications   Prior to Admission medications   Medication Sig Start Date End Date Taking? Authorizing Provider  acetaminophen (TYLENOL) 325 MG tablet Take 650 mg by mouth 2 (two) times daily as needed for mild pain.    Yes Historical Provider, MD  allopurinol (ZYLOPRIM) 100 MG tablet TAKE 1 TABLET BY MOUTH AT BEDTIME 11/10/15  Yes Mikey Kirschner, MD  Amino Acids-Protein Hydrolys (FEEDING SUPPLEMENT, PRO-STAT SUGAR FREE 64,) LIQD Take 30 mLs by mouth 2 (two) times daily.   Yes Historical Provider, MD  Ascorbic Acid (VITAMIN C) 100 MG tablet Take 100 mg by mouth daily.   Yes Historical Provider, MD  budesonide (PULMICORT) 0.25 MG/2ML nebulizer solution Take 2 mLs (0.25 mg total)  by nebulization 2 (two) times daily. 10/22/15  Yes Belkys A Regalado, MD  carvedilol (COREG) 6.25 MG tablet TAKE 1 TABLET BY MOUTH TWICE DAILY 07/06/15  Yes Satira Sark, MD  Cholecalciferol (VITAMIN D) 2000 units CAPS Take 2,000 Units by mouth 2 (two) times daily.   Yes Historical Provider, MD  donepezil (ARICEPT) 10 MG tablet Take 1 tablet (10 mg total) by mouth at bedtime. 03/09/15  Yes Ward Givens, NP  ferrous sulfate 325 (65 FE) MG tablet Take 325 mg by mouth 2 (two) times daily.    Yes Historical Provider, MD  fexofenadine (ALLEGRA) 180 MG tablet Take 1 tablet (180 mg total) by mouth daily. 03/13/14  Yes Mikey Kirschner, MD  furosemide (LASIX) 20 MG tablet Take 1 tablet (20 mg total) by mouth daily. 10/22/15  Yes Belkys A Regalado, MD  furosemide (LASIX) 40 MG tablet Take 40 mg by mouth daily.   Yes Historical Provider, MD  haloperidol (HALDOL) 2 MG tablet Take 2 mg by mouth daily as needed for agitation.   Yes Historical Provider, MD  HYDROcodone-acetaminophen (NORCO/VICODIN) 5-325 MG tablet Take 1 tablet by mouth every 6 (six) hours as needed for moderate pain.   Yes Historical Provider, MD  HYDROcodone-homatropine (HYCODAN) 5-1.5 MG/5ML syrup Take 5 mLs by mouth every 4 (four) hours as needed for cough. 10/22/15  Yes Belkys A Regalado, MD  hydrocortisone (PROCTOSOL HC) 2.5 % rectal cream APPLY RECTALLYBID AS NEEDED FOR HEMORRHOIDS. 07/14/15  Yes Mikey Kirschner, MD  levothyroxine (SYNTHROID, LEVOTHROID) 137 MCG tablet Take 137 mcg by mouth daily before breakfast.   Yes Historical Provider, MD  lidocaine (LIDODERM) 5 % Place 1 patch onto the skin daily as needed (Pain). Remove & Discard patch within 12 hours or as directed by MD   Yes Historical Provider, MD  LORazepam (ATIVAN) 0.5 MG tablet Take 0.5 mg by mouth every 12 (twelve) hours as needed for anxiety.   Yes Historical Provider, MD  pantoprazole (PROTONIX) 40 MG tablet Take 1 tablet (40 mg total) by mouth 2 (two) times daily.  10/22/15  Yes Belkys A Regalado, MD  potassium chloride (K-DUR,KLOR-CON) 10 MEQ tablet Take 10 mEq by mouth daily.   Yes Historical Provider, MD  Pyridoxine HCl (VITAMIN B-6 PO) Take 1 tablet by mouth daily.    Yes Historical Provider, MD  simvastatin (ZOCOR) 40 MG tablet TAKE 1 TABLET BY MOUTH AT BEDTIME 11/10/15  Yes Mikey Kirschner, MD  tamsulosin (FLOMAX) 0.4 MG CAPS capsule TAKE 1 CAPSULE BY MOUTH DAILY AFTER SUPPER Patient taking differently: TAKE 1 CAPSULE BY MOUTH DAILY 08/04/15  Yes Mikey Kirschner, MD  doxycycline (VIBRAMYCIN) 100 MG capsule Take 1 capsule (100 mg total) by mouth 2 (two) times daily.  11/24/15   Nat Christen, MD   BP 107/66 mmHg  Pulse 75  Temp(Src) 98.5 F (36.9 C) (Oral)  Resp 14  Ht '6\' 1"'$  (1.854 m)  SpO2 93% Physical Exam  Constitutional: He is oriented to person, place, and time. He appears well-developed and well-nourished.  Pale, weak-appearing (but normal appearance, according to family)  HENT:  Head: Normocephalic and atraumatic.  Some puffiness and minimal erythema surrounding left eye.   Eyes: Conjunctivae and EOM are normal. Pupils are equal, round, and reactive to light.  Neck: Normal range of motion. Neck supple.  Cardiovascular: Normal rate and regular rhythm.   Pulmonary/Chest: Effort normal and breath sounds normal.  Abdominal: Soft. Bowel sounds are normal.  Musculoskeletal: Normal range of motion.  Neurological: He is alert and oriented to person, place, and time.  Skin: Skin is warm and dry.  Psychiatric: He has a normal mood and affect. His behavior is normal.  Nursing note and vitals reviewed.   ED Course  Procedures (including critical care time)  DIAGNOSTIC STUDIES: Oxygen Saturation is 94% on RA, normal by my interpretation.    COORDINATION OF CARE: 11:11 AM - Suspect cellulitis. Discussed treatment plan with pt's family at bedside which includes Rx antibiotics. Pt's family verbalized understanding and agreed to plan.   Final  diagnoses:  Cellulitis of face   No clinical evidence of a stroke. Suspect bite leading to a minor cellulitis. Will Rx doxycycline 100 mg, Claritin, ice pack. Discussed with patient and his 2 children   I personally performed the services described in this documentation, which was scribed in my presence. The recorded information has been reviewed and is accurate.      Nat Christen, MD 11/24/15 1213

## 2015-11-24 NOTE — Progress Notes (Signed)
PICC line removed per MD order. Pressure applied for five minutes, patient tolerated well. Pressure dressing applied, clean, dry, and intact at this time.

## 2015-11-24 NOTE — Discharge Instructions (Signed)
Cellulitis Cellulitis is an infection of the skin and the tissue under the skin. The infected area is usually red and tender. This happens most often in the arms and lower legs. HOME CARE   Take your antibiotic medicine as told. Finish the medicine even if you start to feel better.  Keep the infected arm or leg raised (elevated).  Put a warm cloth on the area up to 4 times per day.  Only take medicines as told by your doctor.  Keep all doctor visits as told. GET HELP IF:  You see red streaks on the skin coming from the infected area.  Your red area gets bigger or turns a dark color.  Your bone or joint under the infected area is painful after the skin heals.  Your infection comes back in the same area or different area.  You have a puffy (swollen) bump in the infected area.  You have new symptoms.  You have a fever. GET HELP RIGHT AWAY IF:   You feel very sleepy.  You throw up (vomit) or have watery poop (diarrhea).  You feel sick and have muscle aches and pains.   This information is not intended to replace advice given to you by your health care provider. Make sure you discuss any questions you have with your health care provider.   Document Released: 12/28/2007 Document Revised: 04/01/2015 Document Reviewed: 09/26/2011 Elsevier Interactive Patient Education 2016 [place] pack to eye, antibiotic, daily Claritin, return if worse.

## 2015-11-26 ENCOUNTER — Encounter: Payer: Self-pay | Admitting: Licensed Clinical Social Worker

## 2015-11-26 ENCOUNTER — Other Ambulatory Visit: Payer: Self-pay | Admitting: Licensed Clinical Social Worker

## 2015-11-26 DIAGNOSIS — J189 Pneumonia, unspecified organism: Secondary | ICD-10-CM | POA: Diagnosis not present

## 2015-11-26 DIAGNOSIS — I130 Hypertensive heart and chronic kidney disease with heart failure and stage 1 through stage 4 chronic kidney disease, or unspecified chronic kidney disease: Secondary | ICD-10-CM | POA: Diagnosis not present

## 2015-11-26 DIAGNOSIS — N183 Chronic kidney disease, stage 3 (moderate): Secondary | ICD-10-CM | POA: Diagnosis not present

## 2015-11-26 DIAGNOSIS — I5022 Chronic systolic (congestive) heart failure: Secondary | ICD-10-CM

## 2015-11-26 NOTE — Patient Outreach (Signed)
Assessment:   CSW spoke via phone with Patricia Jarrell, daughter of client, on 11/26/15. CSW verified identity of Patricia Jarrell.  CSW and Patricia spoke of client needs.  Patricia said that client and she had met yesterday at home of client with two representatives from [facility].  Patricia said that she had signed documents with Hospice representatives yesterday for client to be a Hospice patient and for client to receive in home Hospice support through [facility] [place].  CSW informed Patricia that since client was now under Hospice care with [facility] [place], that CSW would discharge client from [facility] CSW services on 11/26/15.  Patricia agreed to this plan.  Patricia is also Health Care Power of Attorney for client. CSW thanked Patricia and client for working with [facility] program staff in recent weeks Patricia was very appreciative of [facility] staff support for client in recent weeks.   Plan:  CSW is discharging client on 11/26/15 since client is now receiving in home care with [facility] [place] CSW to inform Nicole Robinson that CSW discharged client on 11/26/15. CSW to fax physician case closure letter to Dr. Hawkins on 11/26/15 informing Dr. Hawkins that CSW discharged client on 11/26/15.   S. MSW, LCSW Licensed Clinical Social Worker [facility] Care [facility] [phone]  

## 2015-12-03 ENCOUNTER — Encounter: Payer: Self-pay | Admitting: *Deleted

## 2015-12-03 ENCOUNTER — Other Ambulatory Visit: Payer: Self-pay | Admitting: *Deleted

## 2015-12-03 NOTE — Patient Outreach (Signed)
[facility]) Care Management  12/03/2015  JAXTYN LINVILLE [birth year] [id number]  I spoke with Mrs. [name] and confirmed that [employer] and 24/7 private duty care are in place and Mr. Martinezlopez' needs are being met. Mrs. Kathyrn Drown expressed her gratitude for our assistance and for the exceptional care being provided by [facility].   [facility] Care Management is grateful for the opportunity to have provided assistance with Mr. Pignato care.   Case closed.    [facility]  [phone]

## 2015-12-10 ENCOUNTER — Telehealth: Payer: Self-pay | Admitting: *Deleted

## 2015-12-10 NOTE — Telephone Encounter (Signed)
------------------------------------------------------------   [name]  CID [id number]  Patient Evan Melendez      Pt's Dr Sterling [phone] Phone# [phone] * DOB [birth year]    '$ RE CANCEL APPT 5/26 11:30AM, NOW UNDER HOSPICE       CARE AND BED RIDDEN                                  Disp:Y/N N If Y = C/B If No Response In 23mnutes ======

## 2015-12-11 NOTE — Telephone Encounter (Signed)
Spoke to daughter, patricia (on [place]).  Pt not able to be transported at this time.  Cancelled appt.  Is under hospice care at this time.  FYI.

## 2015-12-18 ENCOUNTER — Other Ambulatory Visit: Payer: Self-pay | Admitting: Adult Health

## 2015-12-31 ENCOUNTER — Ambulatory Visit: Payer: Medicare Other | Admitting: Cardiology

## 2016-01-23 DEATH — deceased

## 2016-03-16 ENCOUNTER — Ambulatory Visit: Payer: Medicare Other | Admitting: Adult Health
